# Patient Record
Sex: Male | Born: 1955 | Race: Black or African American | Hispanic: No | Smoking: Never smoker
Health system: Southern US, Community
[De-identification: ages and names within clinical notes are randomized; demographics above are authoritative.]

## PROBLEM LIST (undated history)

## (undated) DIAGNOSIS — I10 Essential (primary) hypertension: Secondary | ICD-10-CM

## (undated) DIAGNOSIS — F250 Schizoaffective disorder, bipolar type: Secondary | ICD-10-CM

## (undated) DIAGNOSIS — F251 Schizoaffective disorder, depressive type: Secondary | ICD-10-CM

## (undated) DIAGNOSIS — F259 Schizoaffective disorder, unspecified: Secondary | ICD-10-CM

## (undated) DIAGNOSIS — K219 Gastro-esophageal reflux disease without esophagitis: Secondary | ICD-10-CM

---

## 1994-04-06 HISTORY — PX: FRACTURE SURGERY: SHX138

## 2004-10-02 ENCOUNTER — Ambulatory Visit: Payer: Self-pay | Admitting: Nurse Practitioner

## 2004-10-03 ENCOUNTER — Ambulatory Visit: Payer: Self-pay | Admitting: *Deleted

## 2011-03-19 ENCOUNTER — Ambulatory Visit (HOSPITAL_COMMUNITY)
Admission: RE | Admit: 2011-03-19 | Discharge: 2011-03-19 | Disposition: A | Discharge status: Home / Self Care | Payer: Self-pay | Source: Ambulatory Visit | Attending: Family Medicine | Admitting: Family Medicine

## 2011-03-19 ENCOUNTER — Other Ambulatory Visit (HOSPITAL_COMMUNITY): Payer: Self-pay | Admitting: Family Medicine

## 2011-03-19 DIAGNOSIS — R079 Chest pain, unspecified: Secondary | ICD-10-CM | POA: Insufficient documentation

## 2011-04-09 ENCOUNTER — Emergency Department (HOSPITAL_COMMUNITY)
Admission: EM | Admit: 2011-04-09 | Discharge: 2011-04-09 | Disposition: A | Discharge status: Home / Self Care | Payer: Self-pay | Attending: Emergency Medicine | Admitting: Emergency Medicine

## 2011-04-09 DIAGNOSIS — Z7982 Long term (current) use of aspirin: Secondary | ICD-10-CM | POA: Insufficient documentation

## 2011-04-09 DIAGNOSIS — I10 Essential (primary) hypertension: Secondary | ICD-10-CM | POA: Insufficient documentation

## 2011-04-09 DIAGNOSIS — R490 Dysphonia: Secondary | ICD-10-CM | POA: Insufficient documentation

## 2011-04-09 DIAGNOSIS — Z79899 Other long term (current) drug therapy: Secondary | ICD-10-CM | POA: Insufficient documentation

## 2011-04-09 HISTORY — DX: Schizoaffective disorder, unspecified: F25.9

## 2011-04-09 HISTORY — DX: Schizoaffective disorder, bipolar type: F25.0

## 2011-04-09 HISTORY — DX: Essential (primary) hypertension: I10

## 2011-04-09 NOTE — ED Notes (Addendum)
Pt. Was in prison for 12 years and developed a hoarse throat. Is only able to speak a few hours and then is unable to talk at all.  Throat is scratchy, pt. Denies any cough or night sweats.  Does have once a month clack sputum with an odor come from his throat

## 2011-04-09 NOTE — ED Provider Notes (Signed)
History     CSN: [id number]  Arrival date & time 04/09/11  0920   First MD Initiated Contact with Patient 04/09/11 [phone]      Chief Complaint  Patient presents with  . Hoarse    hoarse throat    (Consider location/radiation/quality/duration/timing/severity/associated sxs/prior treatment) HPI The patient states she's been having gradually worsening hoarseness over the last 8 months. Patient had been incarcerated and was evaluated while he was in jail. Patient states he had his tonsils and thyroids checked. Patient was also told to drink more fluids. He states he is not a smoker. He does not use alcohol. Has not had any fevers or weight loss. Patient states this is painless. It however has been gradually getting worse. He finds that his voice is normal only for a few minutes and the beginning of the day. Patient came to emergency room to get some assistance as he recently got out of jail. Past Medical History  Diagnosis Date  . Hypertension   . Glaucoma   . Schizophrenia, schizo-affective     History reviewed. No pertinent past surgical history.  History reviewed. No pertinent family history.  History  Substance Use Topics  . Smoking status: Never Smoker   . Smokeless tobacco: Never Used  . Alcohol Use: No      Review of Systems  All other systems reviewed and are negative.    Allergies  Review of patient's allergies indicates no known allergies.  Home Medications   Current Outpatient Rx  Name Route Sig Dispense Refill  . ASPIRIN 81 MG PO TABS Oral Take 81 mg by mouth daily.      . ATENOLOL 25 MG PO TABS Oral Take 25 mg by mouth daily.      Marland Kitchen HYDROCHLOROTHIAZIDE 25 MG PO TABS Oral Take 25 mg by mouth daily.      Marland Kitchen LISINOPRIL 40 MG PO TABS Oral Take 40 mg by mouth daily.      Marland Kitchen OLANZAPINE 20 MG PO TABS Oral Take 20 mg by mouth at bedtime.      Marland Kitchen TIMOLOL HEMIHYDRATE 0.5 % OP SOLN Both Eyes Place 1 drop into both eyes 2 (two) times daily.        BP 133/97  Pulse 81   Temp(Src) 97.6 F (36.4 C) (Oral)  Resp 20  Ht 5\' 11"  (1.803 m)  Wt 211 lb (95.709 kg)  BMI 29.43 kg/m2  SpO2 95%  Physical Exam  Nursing note and vitals reviewed. Constitutional: He appears well-developed and well-nourished. No distress.  HENT:  Head: Normocephalic and atraumatic. No trismus in the jaw.  Right Ear: External ear normal.  Left Ear: External ear normal.  Mouth/Throat: Oropharynx is clear and moist. No oral lesions. Normal dentition. No uvula swelling. No oropharyngeal exudate.       Hoarse voice  Eyes: Conjunctivae are normal. Right eye exhibits no discharge. Left eye exhibits no discharge. No scleral icterus.  Neck: Normal range of motion. Neck supple. No tracheal deviation present. No mass and no thyromegaly present.  Cardiovascular: Normal rate, regular rhythm and intact distal pulses.   Pulmonary/Chest: Effort normal and breath sounds normal. No stridor. No respiratory distress. He has no wheezes. He has no rales.  Abdominal: Soft. Bowel sounds are normal. He exhibits no distension. There is no tenderness. There is no rebound and no guarding.  Musculoskeletal: He exhibits no edema and no tenderness.  Neurological: He is alert. He has normal strength. No sensory deficit. Cranial nerve deficit:  no  gross defecits noted. He exhibits normal muscle tone. He displays no seizure activity. Coordination normal.  Skin: Skin is warm and dry. No rash noted.  Psychiatric: He has a normal mood and affect.    ED Course  Procedures (including critical care time)  Labs Reviewed - No data to display No results found.   1. Hoarseness of voice       MDM  Patient with hoarseness for the last several months. He has not had weight loss and is not a smoker. Symptoms are suspicious for a possible laryngeal polyp. Certainly cannot exclude a malignancy. I have discussed the findings with the patient is in encouraged him to follow up with an ear nose and throat doctor. A referral  was given.        Celene Kras, MD 04/09/11 1016

## 2011-05-25 ENCOUNTER — Encounter (HOSPITAL_COMMUNITY): Payer: Self-pay | Admitting: Emergency Medicine

## 2011-05-25 ENCOUNTER — Emergency Department (HOSPITAL_COMMUNITY): Payer: Self-pay

## 2011-05-25 ENCOUNTER — Emergency Department (HOSPITAL_COMMUNITY)
Admission: EM | Admit: 2011-05-25 | Discharge: 2011-05-25 | Disposition: A | Discharge status: Home / Self Care | Payer: Self-pay | Attending: Emergency Medicine | Admitting: Emergency Medicine

## 2011-05-25 DIAGNOSIS — R221 Localized swelling, mass and lump, neck: Secondary | ICD-10-CM | POA: Insufficient documentation

## 2011-05-25 DIAGNOSIS — R220 Localized swelling, mass and lump, head: Secondary | ICD-10-CM | POA: Insufficient documentation

## 2011-05-25 DIAGNOSIS — H409 Unspecified glaucoma: Secondary | ICD-10-CM | POA: Insufficient documentation

## 2011-05-25 DIAGNOSIS — R0602 Shortness of breath: Secondary | ICD-10-CM | POA: Insufficient documentation

## 2011-05-25 DIAGNOSIS — I10 Essential (primary) hypertension: Secondary | ICD-10-CM | POA: Insufficient documentation

## 2011-05-25 MED ORDER — IOHEXOL 300 MG/ML  SOLN
75.0000 mL | Freq: Once | INTRAMUSCULAR | Status: AC | PRN
  Administered 2011-05-25: 75 mL via INTRAVENOUS

## 2011-05-25 NOTE — ED Notes (Signed)
Pt here for sore throat with hoarseness x several months; pt was seen here for similar in Jan; pt handling secretions

## 2011-05-25 NOTE — ED Provider Notes (Signed)
History     CSN: [id number]  Arrival date & time 05/25/11  1223   First MD Initiated Contact with Patient 05/25/11 1334      Chief Complaint  Patient presents with  . Sore Throat    (Consider location/radiation/quality/duration/timing/severity/associated sxs/prior treatment) HPI Comments: Patient states that he's had hoarseness for about last 6 months. He was seen here about a month ago for the same he was referred to ENT for followup but was not able to be seen ENT due to having no insurance. He now complains of about a two-week history of some feeling of fullness and swelling to his through. He feels like there is something stuck in his throat. He has shortness of breath when he lies flat due to the swelling and he feels his throat. He has no trouble handling secretions. Denies any fevers denies any nasal congestion denies a cough or chest congestion. Denies any pain on swallowing  Patient is a 56 y.o. male presenting with pharyngitis. The history is provided by the patient.  Sore Throat Associated symptoms include shortness of breath. Pertinent negatives include no chest pain, no abdominal pain and no headaches.    Past Medical History  Diagnosis Date  . Hypertension   . Glaucoma   . Schizophrenia, schizo-affective     History reviewed. No pertinent past surgical history.  History reviewed. No pertinent family history.  History  Substance Use Topics  . Smoking status: Never Smoker   . Smokeless tobacco: Never Used  . Alcohol Use: No      Review of Systems  Constitutional: Negative for fever, chills, diaphoresis and fatigue.  HENT: Negative for congestion, rhinorrhea and sneezing.   Eyes: Negative.   Respiratory: Positive for shortness of breath. Negative for cough and chest tightness.   Cardiovascular: Negative for chest pain and leg swelling.  Gastrointestinal: Negative for nausea, vomiting, abdominal pain, diarrhea and blood in stool.  Genitourinary: Negative for  frequency, hematuria, flank pain and difficulty urinating.  Musculoskeletal: Negative for back pain and arthralgias.  Skin: Negative for rash.  Neurological: Negative for dizziness, speech difficulty, weakness, numbness and headaches.    Allergies  Review of patient's allergies indicates no known allergies.  Home Medications   Current Outpatient Rx  Name Route Sig Dispense Refill  . ASPIRIN 81 MG PO TABS Oral Take 81 mg by mouth daily.     . ATENOLOL 25 MG PO TABS Oral Take 25 mg by mouth daily.     Marland Kitchen HYDROCHLOROTHIAZIDE 25 MG PO TABS Oral Take 25 mg by mouth daily.     Marland Kitchen LISINOPRIL 40 MG PO TABS Oral Take 40 mg by mouth daily.     Marland Kitchen OLANZAPINE 20 MG PO TABS Oral Take 20 mg by mouth at bedtime.     Marland Kitchen TIMOLOL HEMIHYDRATE 0.5 % OP SOLN Both Eyes Place 1 drop into both eyes 2 (two) times daily.        BP 140/98  Pulse 56  Temp(Src) 97.8 F (36.6 C) (Oral)  Resp 16  SpO2 98%  Physical Exam  Constitutional: He is oriented to person, place, and time. He appears well-developed and well-nourished.  HENT:  Head: Normocephalic and atraumatic.  Eyes: Pupils are equal, round, and reactive to light.  Neck: Normal range of motion. Neck supple. No tracheal deviation present. No thyromegaly present.  Cardiovascular: Normal rate, regular rhythm and normal heart sounds.   Pulmonary/Chest: Effort normal and breath sounds normal. No stridor. No respiratory distress. He has no wheezes.  He has no rales. He exhibits no tenderness.  Abdominal: Soft. Bowel sounds are normal. There is no tenderness. There is no rebound and no guarding.  Musculoskeletal: Normal range of motion. He exhibits no edema.  Lymphadenopathy:    He has no cervical adenopathy.  Neurological: He is alert and oriented to person, place, and time.  Skin: Skin is warm and dry. No rash noted.  Psychiatric: He has a normal mood and affect.    ED Course  Procedures (including critical care time)  No results found for this or any  previous visit. Ct Soft Tissue Neck W Contrast  05/25/2011  *RADIOLOGY REPORT*  Clinical Data: 56 year old male with hoarseness times 8 months. Abnormal sensation in the back of the throat and difficulty swallowing.  CT NECK WITH CONTRAST  Technique:  Multidetector CT imaging of the neck was performed with intravenous contrast.  Contrast: 75mL OMNIPAQUE IOHEXOL 300 MG/ML IV SOLN  Comparison: Chest radiographs 05/19/2010.  Findings: Contrast was injected via the left upper extremity. There is suboptimal intravascular contrast bolus timing which appears related to a stenosis of the left innominate vein (series 3 image 102).  Subsequently, contrast is being refluxed into the left internal jugular, paravertebral and azygos venous system. The right innominate vein and visualized superior vena cava are within normal limits.  No superior mediastinal mass.  In the midline posterior to the hyoid bone and associated with the anterior aspect of the thyroid cartilage there is a round intermediate density mass measuring 15 x 15 x 18 mm (series 3 image 51, sagittal image 33).  Surrounding pre-epiglottic fat does not appear inflamed, and the epiglottis is unaffected.  The thyroid gland is present and otherwise within normal limits.  There is asymmetric blunting of the right piriform sinus.  The glottis is closed.  No laryngeal or mucosal space hyperenhancement. Tonsillar pillar hypertrophy is symmetric.  Nasopharynx is within normal limits.  Negative parapharyngeal, retropharyngeal, and sublingual spaces. Submandibular and parotid glands are within normal limits.  No cervical lymphadenopathy.  Negative visualized brain parenchyma.  Left maxillary sinus mucous retention cyst.  Other visualized paranasal sinuses and mastoids are clear.  Degenerative changes in the cervical spine. No acute osseous abnormality identified. Negative visualized lung parenchyma except for dependent atelectasis.  IMPRESSION: 1.  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  Has the patient ever had an indwelling catheter at this site? 2.  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  No CT evidence that this system is infected at this time. 3.  Asymmetric appearance of the right piriform sinus.  No discrete pharyngeal mucosal mass is identified.  No cervical lymphadenopathy.  In light of the patient's symptoms, recommend ENT follow-up for direct visualization of this site.  Original Report Authenticated By: Harley Hallmark, M.D.     1. Throat swelling       MDM  Spoke with Dr. Annalee Genta who will f/u pt in office.  Pt in no distress now, no signs of airway compromise        Rolan Bucco, MD 05/25/11 1737

## 2011-05-26 ENCOUNTER — Encounter (HOSPITAL_COMMUNITY): Payer: Self-pay | Admitting: Emergency Medicine

## 2011-05-26 ENCOUNTER — Emergency Department (INDEPENDENT_AMBULATORY_CARE_PROVIDER_SITE_OTHER)
Admission: EM | Admit: 2011-05-26 | Discharge: 2011-05-26 | Disposition: A | Discharge status: Home / Self Care | Payer: Self-pay | Source: Home / Self Care | Attending: Emergency Medicine | Admitting: Emergency Medicine

## 2011-05-26 DIAGNOSIS — R490 Dysphonia: Secondary | ICD-10-CM

## 2011-05-26 MED ORDER — TRAMADOL HCL 50 MG PO TABS: 100.0000 mg | ORAL_TABLET | Freq: Three times a day (TID) | ORAL | Status: AC | PRN

## 2011-05-26 MED ORDER — OMEPRAZOLE 20 MG PO CPDR: 20.0000 mg | DELAYED_RELEASE_CAPSULE | Freq: Every day | ORAL | Status: DC

## 2011-05-26 NOTE — ED Provider Notes (Signed)
Chief Complaint  Patient presents with  . Sore Throat    History of Present Illness:   The patient is a 56 year old male who just got out of incarceration. For the past 8 months he has had hoarseness, sore throat, and trouble breathing. He has not had any cough. He denies any nasal congestion or postnasal drainage. He denies any cervical adenopathy or chest pain. He has occasional reflux symptoms, about once or twice a week. He denies any use of tobacco at all or any excessive use of alcohol. He went to the emergency room first for this in January and of this year and was referred to an ear nose and throat specialist. He doesn't have any insurance, and he couldn't keep the appointment and went back to the emergency room yesterday. At that time a CT scan was done which did not show any mediastinal mass or superior vena caval abnormality. He did have a midline round hyperdense mass associated with the hyoid bone and anterior thyroid cartilage and was likely due to a congenital thyroglossal duct cyst. There was no evidence of infection. He has an asymmetric appearance to the right piriformis sinus, but no discrete submucosal mucosal mass was identified and there was no cervical lymphadenopathy. He was told to followup with ear nose and throat specialist again and not given any medication. He comes in here today because he needs something for the pain.  Review of Systems:  Other than noted above, the patient denies any of the following symptoms. Systemic:  No fever, chills, sweats, fatigue, myalgias, headache, or anorexia. Eye:  No redness, pain or drainage. ENT:  No earache, nasal congestion, rhinorrhea, sinus pressure, or sore throat. Lungs:  No cough, sputum production, wheezing, shortness of breath. Or chest pain. GI:  No nausea, vomiting, abdominal pain or diarrhea. Skin:  No rash or itching.  PMFSH:  Past medical history, family history, social history, meds, and allergies were reviewed.  Physical  Exam:   Vital signs:  BP 134/92  Pulse 52  Temp(Src) 98.1 F (36.7 C) (Oral)  Resp 18  SpO2 97% General:  Alert, in no distress. Eye:  No conjunctival injection or drainage. ENT:  TMs and canals were normal, without erythema or inflammation.  Nasal mucosa was clear and uncongested, without drainage.  Mucous membranes were moist.  Pharynx was clear, without exudate or drainage.  There were no oral ulcerations or lesions. Neck:  Supple, no adenopathy, tenderness or mass. Lungs:  No respiratory distress.  Lungs were clear to auscultation, without wheezes, rales or rhonchi.  Breath sounds were clear and equal bilaterally. Heart:  Regular rhythm, without gallops, murmers or rubs. Skin:  Clear, warm, and dry, without rash or lesions.  Labs:  No results found for this or any previous visit.   Radiology:  Ct Soft Tissue Neck W Contrast  05/25/2011  *RADIOLOGY REPORT*  Clinical Data: 56 year old male with hoarseness times 8 months. Abnormal sensation in the back of the throat and difficulty swallowing.  CT NECK WITH CONTRAST  Technique:  Multidetector CT imaging of the neck was performed with intravenous contrast.  Contrast: 75mL OMNIPAQUE IOHEXOL 300 MG/ML IV SOLN  Comparison: Chest radiographs 05/19/2010.  Findings: Contrast was injected via the left upper extremity. There is suboptimal intravascular contrast bolus timing which appears related to a stenosis of the left innominate vein (series 3 image 102).  Subsequently, contrast is being refluxed into the left internal jugular, paravertebral and azygos venous system. The right innominate vein and visualized superior  vena cava are within normal limits.  No superior mediastinal mass.  In the midline posterior to the hyoid bone and associated with the anterior aspect of the thyroid cartilage there is a round intermediate density mass measuring 15 x 15 x 18 mm (series 3 image 51, sagittal image 33).  Surrounding pre-epiglottic fat does not appear inflamed,  and the epiglottis is unaffected.  The thyroid gland is present and otherwise within normal limits.  There is asymmetric blunting of the right piriform sinus.  The glottis is closed.  No laryngeal or mucosal space hyperenhancement. Tonsillar pillar hypertrophy is symmetric.  Nasopharynx is within normal limits.  Negative parapharyngeal, retropharyngeal, and sublingual spaces. Submandibular and parotid glands are within normal limits.  No cervical lymphadenopathy.  Negative visualized brain parenchyma.  Left maxillary sinus mucous retention cyst.  Other visualized paranasal sinuses and mastoids are clear.  Degenerative changes in the cervical spine. No acute osseous abnormality identified. Negative visualized lung parenchyma except for dependent atelectasis.  IMPRESSION: 1.  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  Has the patient ever had an indwelling catheter at this site? 2.  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  No CT evidence that this system is infected at this time. 3.  Asymmetric appearance of the right piriform sinus.  No discrete pharyngeal mucosal mass is identified.  No cervical lymphadenopathy.  In light of the patient's symptoms, recommend ENT follow-up for direct visualization of this site.  Original Report Authenticated By: Harley Hallmark, M.D.    Assessment:   Diagnoses that have been ruled out:  None  Diagnoses that are still under consideration:  None  Final diagnoses:  Hoarseness - I told him that this could be due to a polyp, tumor, cancer, or to chronic reflux. I told him it was the utmost importance for him to followup with the ear nose and throat specialist, Dr. Annalee Genta as soon as possible he could.      Medical Decision  Making:  I will go ahead and treat him for reflux since he does have some reflux symptoms, but I told him that even if he gets better with this he should still followup with Dr. Annalee Genta.   Plan:   1.  The following meds were prescribed:   New Prescriptions   OMEPRAZOLE (PRILOSEC) 20 MG CAPSULE    Take 1 capsule (20 mg total) by mouth daily.   TRAMADOL (ULTRAM) 50 MG TABLET    Take 2 tablets (100 mg total) by mouth every 8 (eight) hours as needed for pain.   2.  The patient was instructed in symptomatic care and handouts were given. 3.  The patient was told to return if becoming worse in any way, if no better in 3 or 4 days, and given some red flag symptoms that would indicate earlier return.   Roque Lias, MD 05/26/11 [phone]

## 2011-05-26 NOTE — ED Notes (Signed)
Patient reports initially being hoarse, now has pain associated with hoarseness.  Seen in ed x 2 for complaints.  Yesterday they did a ct scan and reported soft tissue swelling, but did not receive anything for pain.  Patient is trying to get enough money to see specialist per patient

## 2011-05-26 NOTE — Discharge Instructions (Signed)
Hoarseness Hoarseness is produced from a variety of causes. It is important to find the cause so it can be treated. In the absence of a cold or upper respiratory illness, any hoarseness lasting more than 2 weeks should be looked at by a specialist. This is especially important if you have a history of smoking or alcohol use. It is also important to keep in mind that as you grow older, your voice will naturally get weaker, making it easier for you to become hoarse from straining your vocal cords.  CAUSES  Any illness that affects your vocal cords can result in a hoarse voice. Examples of conditions that can affect the vocal cords are listed as follows:   Allergies.   Colds.   Sinusitis.   Gastroesophageal reflux disease.   Croup.   Injury.   Nodules.   Exposure to smoke or toxic fumes or gases.   Congenital and genetic defects.   Paralysis of the vocal cords.   Infections.   Advanced age.  DIAGNOSIS  In order to diagnose the cause of your hoarseness, your caregiver will examine your throat using an instrument that uses a tube with a small lighted camera (laryngoscope). It allows your caregiver to look into the mouth and down the throat. TREATMENT  For most cases, treatment will focus on the specific cause of the hoarseness. Depending on the cause, hoarseness can be a temporary condition (acute) or it can be long lasting (chronic). Most cases of hoarseness clear up without complications. Your caregiver will explain to you if this is not likely to happen. SEEK IMMEDIATE MEDICAL CARE IF:   You have increasing hoarseness or loss of voice.   You have shortness of breath.   You are coughing up blood.   There is pain in your neck or throat.  Document Released: 03/06/2005 Document Revised: 12/03/2010 Document Reviewed: 05/29/2010 [facility] Patient Information 2012 [place], [place].Laryngoscopy A laryngoscopy is a procedure performed to view the back of the throat, vocal cords, and  voice box (larynx). It may be done in order to figure out why a person has:  A cough.   Voice changes, such as new hoarseness.   Throat pain.   Problems swallowing.  During a laryngoscopy, tissue samples (biopsies) can be taken or foreign bodies can be removed. A laryngoscopy can be done in the caregiver's office. It may be performed with a hand-held mirror, or it may require the use of a fiberoptic scope. Under some circumstances, it may be done in a hospital with medicine to help you sleep (general anesthesia). LET YOUR CAREGIVER KNOW ABOUT:   Allergies.   Medicines taken, including herbs, eyedrops, over-the-counter medicines, and creams.   Use of steroids (by mouth or creams).   Previous problems with anesthetics or numbing medicines.   History of bleeding or blood problems.   History of blood clots.   Possibility of pregnancy, if this applies.   Previous surgery.   Other health problems.  RISKS AND COMPLICATIONS   Pain.   Gagging.   Vomiting.   Swelling.   Bleeding.   Problems from anesthesia.  BEFORE THE PROCEDURE  Several days before the procedure, you may have blood tests to make sure the blood clots normally. You may be asked to stop taking blood thinners, aspirin, and/or nonsteroidal anti-inflammatory drugs (NSAIDs) before the procedure. Do not give aspirin to children. If general anesthesia is going to be used, you will usually be asked to stop eating and drinking at least 8 hours before  the procedure. Have someone go with you to the procedure in order to drive you home afterwards. PROCEDURE  If you are having the procedure in the caregiver's office, it is usually performed while sitting up in a special exam chair with a headrest. A numbing medicine will be sprayed into the mouth and on the back of the throat. Your caregiver will use a gauze pad to hold the tongue out of the way. A mirror will be held at the back of the throat to allow your caregiver to see  down the throat. You may be asked to make certain sounds so that vocal cord movement can be observed. If a fiberoptic scope is being used, it will be inserted into either the nose or the mouth and slipped into the throat. Your caregiver can look through an eyepiece or can see an image projected on a monitor. Pieces of tissue can be taken (biopsies) or foreign bodies removed. If you need to have the procedure performed under general anesthesia, you will be lying down on a special operating table. The same kinds of procedures will be followed, but you will not be aware of them. AFTER THE PROCEDURE   When laryngoscopy is done with only local numbing, it usually does not require any changes to your activity level after the procedure is complete.   You may have a sore throat.   You may be asked to rest the voice for some length of time after the procedure.   Do not smoke.   Follow your caregiver's directions regarding eating and drinking after the procedure.   If you had a biopsy taken, your caregiver may advise trying to avoid coughing, whispering, or clearing the throat.   If you were given a general anesthetic or a medicine to help you relax (sedative), you will be sleepy.   There may be some pain or you may feel sick to your stomach (nauseous), but this can usually be controlled with medicines taken by mouth.   You will stay in the recovery room until awake and able to drink fluids.   You can go back to his or her usual level of activity within several days.  HOME CARE INSTRUCTIONS   Take all medicines exactly as directed.   Follow any prescribed diet.   Follow instructions regarding rest (including voice rest) and physical activity.   Follow instructions for the use of throat lozenges or gargles.  SEEK IMMEDIATE MEDICAL CARE IF:   You have severe pain.   You have new bleeding during coughing, spitting, or vomiting.   You develop nausea and vomiting.   You develop new problems  with swallowing.   You have difficulty breathing or have shortness of breath.   You have chest pain.   Your voice changes.   You have a fever.   You develop a cough.  MAKE SURE YOU:   Understand these instructions.   Will watch your condition.   Will get help right away if you are not doing well or get worse.  Document Released: 06/17/2009 Document Revised: 12/03/2010 Document Reviewed: 06/17/2009 [facility] Patient Information 2012 [place], [place].

## 2011-06-01 ENCOUNTER — Other Ambulatory Visit (HOSPITAL_COMMUNITY): Payer: Self-pay | Admitting: Otolaryngology

## 2011-06-01 DIAGNOSIS — D380 Neoplasm of uncertain behavior of larynx: Secondary | ICD-10-CM

## 2011-06-09 ENCOUNTER — Encounter (HOSPITAL_COMMUNITY)
Admission: RE | Admit: 2011-06-09 | Discharge: 2011-06-09 | Disposition: A | Discharge status: Home / Self Care | Payer: Self-pay | Source: Ambulatory Visit | Attending: Otolaryngology | Admitting: Otolaryngology

## 2011-06-09 ENCOUNTER — Encounter (HOSPITAL_COMMUNITY): Payer: Self-pay

## 2011-06-09 DIAGNOSIS — D491 Neoplasm of unspecified behavior of respiratory system: Secondary | ICD-10-CM | POA: Insufficient documentation

## 2011-06-09 DIAGNOSIS — D380 Neoplasm of uncertain behavior of larynx: Secondary | ICD-10-CM

## 2011-06-09 DIAGNOSIS — I719 Aortic aneurysm of unspecified site, without rupture: Secondary | ICD-10-CM | POA: Insufficient documentation

## 2011-06-09 MED ORDER — FLUDEOXYGLUCOSE F - 18 (FDG) INJECTION
18.9000 | Freq: Once | INTRAVENOUS | Status: AC | PRN
  Administered 2011-06-09: 18.9 via INTRAVENOUS

## 2011-06-19 ENCOUNTER — Encounter (HOSPITAL_COMMUNITY): Payer: Self-pay | Admitting: Pharmacy Technician

## 2011-06-23 ENCOUNTER — Other Ambulatory Visit: Payer: Self-pay | Admitting: Otolaryngology

## 2011-06-24 ENCOUNTER — Other Ambulatory Visit: Payer: Self-pay

## 2011-06-24 ENCOUNTER — Encounter (HOSPITAL_COMMUNITY): Payer: Self-pay

## 2011-06-24 ENCOUNTER — Encounter (HOSPITAL_COMMUNITY)
Admission: RE | Admit: 2011-06-24 | Discharge: 2011-06-24 | Disposition: A | Discharge status: Home / Self Care | Payer: Self-pay | Source: Ambulatory Visit | Attending: Otolaryngology | Admitting: Otolaryngology

## 2011-06-24 HISTORY — DX: Gastro-esophageal reflux disease without esophagitis: K21.9

## 2011-06-24 LAB — CBC
HCT: 39.4 % (ref 39.0–52.0)
Hemoglobin: 13.3 g/dL (ref 13.0–17.0)
MCHC: 33.8 g/dL (ref 30.0–36.0)
RBC: 4.71 MIL/uL (ref 4.22–5.81)

## 2011-06-24 LAB — SURGICAL PCR SCREEN: MRSA, PCR: NEGATIVE

## 2011-06-24 LAB — BASIC METABOLIC PANEL
BUN: 12 mg/dL (ref 6–23)
Chloride: 101 mEq/L (ref 96–112)
GFR calc Af Amer: 87 mL/min — ABNORMAL LOW (ref >90)
GFR calc non Af Amer: 75 mL/min — ABNORMAL LOW (ref >90)
Potassium: 3.7 mEq/L (ref 3.5–5.1)
Sodium: 138 mEq/L (ref 135–145)

## 2011-06-24 NOTE — Pre-Procedure Instructions (Signed)
20 Jimmy Marshall  06/24/2011   Your procedure is scheduled on:  Tuesday June 30, 2011  Report to [facility] at 0800 AM.  Call this number if you have problems the morning of surgery: [phone]   Remember:   Do not eat food:After Midnight.  May have clear liquids: up to 4 Hours before arrival. (up to 4:00am)  Clear liquids include soda, tea, black coffee, apple or grape juice, broth.  Take these medicines the morning of surgery with A SIP OF WATER: atenolol, omeprazole, timolol   Do not wear jewelry, make-up or nail polish.  Do not wear lotions, powders, or perfumes. You may wear deodorant.  Do not shave 48 hours prior to surgery.  Do not bring valuables to the hospital.  Contacts, dentures or bridgework may not be worn into surgery.  Leave suitcase in the car. After surgery it may be brought to your room.  For patients admitted to the hospital, checkout time is 11:00 AM the day of discharge.   Patients discharged the day of surgery will not be allowed to drive home.  Name and phone number of your driver: Audrie Lia [phone]  Special Instructions: CHG Shower Use Special Wash: 1/2 bottle night before surgery and 1/2 bottle morning of surgery.   Please read over the following fact sheets that you were given: Pain Booklet, Coughing and Deep Breathing, MRSA Information and Surgical Site Infection Prevention

## 2011-06-25 NOTE — Consult Note (Signed)
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  History includes HTN, glaucoma, Schizophrenia, schizo-affective, GERD, non-smoker.  EKG shows SB at 48 bpm.   CXR on 03/19/11 was WNL.  Preoperative labs acceptable.  Plan to proceed.

## 2011-06-29 MED ORDER — DEXAMETHASONE SODIUM PHOSPHATE 10 MG/ML IJ SOLN
10.0000 mg | Freq: Once | INTRAMUSCULAR | Status: AC
  Administered 2011-06-30: 10 mg
  Filled 2011-06-29: qty 1

## 2011-06-29 MED ORDER — CEFAZOLIN SODIUM-DEXTROSE 2-3 GM-% IV SOLR
2.0000 g | INTRAVENOUS | Status: AC
  Administered 2011-06-30: 2 g via INTRAVENOUS
  Filled 2011-06-29: qty 50

## 2011-06-30 ENCOUNTER — Encounter (HOSPITAL_COMMUNITY): Payer: Self-pay | Admitting: Vascular Surgery

## 2011-06-30 ENCOUNTER — Encounter (HOSPITAL_COMMUNITY)
Admission: RE | Disposition: A | Discharge status: Home / Self Care | Payer: Self-pay | Source: Ambulatory Visit | Attending: Otolaryngology

## 2011-06-30 ENCOUNTER — Ambulatory Visit (HOSPITAL_COMMUNITY): Payer: Self-pay | Admitting: Vascular Surgery

## 2011-06-30 ENCOUNTER — Ambulatory Visit (HOSPITAL_COMMUNITY)
Admission: RE | Admit: 2011-06-30 | Discharge: 2011-07-01 | Disposition: A | Discharge status: Home / Self Care | Payer: Self-pay | Source: Ambulatory Visit | Attending: Otolaryngology | Admitting: Otolaryngology

## 2011-06-30 ENCOUNTER — Encounter (HOSPITAL_COMMUNITY): Payer: Self-pay | Admitting: Otolaryngology

## 2011-06-30 DIAGNOSIS — Q892 Congenital malformations of other endocrine glands: Secondary | ICD-10-CM | POA: Insufficient documentation

## 2011-06-30 DIAGNOSIS — F259 Schizoaffective disorder, unspecified: Secondary | ICD-10-CM | POA: Insufficient documentation

## 2011-06-30 DIAGNOSIS — Z01812 Encounter for preprocedural laboratory examination: Secondary | ICD-10-CM | POA: Insufficient documentation

## 2011-06-30 DIAGNOSIS — R490 Dysphonia: Secondary | ICD-10-CM | POA: Insufficient documentation

## 2011-06-30 DIAGNOSIS — K219 Gastro-esophageal reflux disease without esophagitis: Secondary | ICD-10-CM | POA: Insufficient documentation

## 2011-06-30 DIAGNOSIS — I10 Essential (primary) hypertension: Secondary | ICD-10-CM | POA: Insufficient documentation

## 2011-06-30 DIAGNOSIS — Z01810 Encounter for preprocedural cardiovascular examination: Secondary | ICD-10-CM | POA: Insufficient documentation

## 2011-06-30 HISTORY — DX: Schizoaffective disorder, depressive type: F25.1

## 2011-06-30 HISTORY — PX: INCISION AND DRAINAGE / EXCISION THYROGLOSSAL CYST: SUR667

## 2011-06-30 HISTORY — PX: THYROGLOSSAL DUCT CYST: SHX297

## 2011-06-30 SURGERY — EXCISION, THYROGLOSSAL DUCT CYST
Anesthesia: General | Site: Neck | Wound class: Clean

## 2011-06-30 MED ORDER — LACTATED RINGERS IV SOLN
INTRAVENOUS | Status: DC
  Administered 2011-06-30: 09:00:00 via INTRAVENOUS

## 2011-06-30 MED ORDER — OLANZAPINE 10 MG PO TABS
20.0000 mg | ORAL_TABLET | Freq: Every day | ORAL | Status: DC
  Administered 2011-06-30: 20 mg via ORAL
  Filled 2011-06-30 (×2): qty 2

## 2011-06-30 MED ORDER — GLYCOPYRROLATE 0.2 MG/ML IJ SOLN
INTRAMUSCULAR | Status: DC | PRN
  Administered 2011-06-30: .6 mg via INTRAVENOUS

## 2011-06-30 MED ORDER — MORPHINE SULFATE 2 MG/ML IJ SOLN
INTRAMUSCULAR | Status: AC
  Administered 2011-06-30: 2 mg via INTRAMUSCULAR
  Filled 2011-06-30: qty 1

## 2011-06-30 MED ORDER — LISINOPRIL 40 MG PO TABS
40.0000 mg | ORAL_TABLET | Freq: Every day | ORAL | Status: DC
  Administered 2011-06-30 – 2011-07-01 (×2): 40 mg via ORAL
  Filled 2011-06-30 (×2): qty 1

## 2011-06-30 MED ORDER — HYDROMORPHONE HCL PF 1 MG/ML IJ SOLN
0.2500 mg | INTRAMUSCULAR | Status: DC | PRN
  Administered 2011-06-30 (×2): 0.5 mg via INTRAVENOUS

## 2011-06-30 MED ORDER — HYDROCHLOROTHIAZIDE 25 MG PO TABS
25.0000 mg | ORAL_TABLET | Freq: Every day | ORAL | Status: DC
  Administered 2011-06-30 – 2011-07-01 (×2): 25 mg via ORAL
  Filled 2011-06-30 (×2): qty 1

## 2011-06-30 MED ORDER — ZOLPIDEM TARTRATE 5 MG PO TABS: 5.0000 mg | ORAL_TABLET | Freq: Every evening | ORAL | Status: DC | PRN

## 2011-06-30 MED ORDER — CEFAZOLIN SODIUM 1-5 GM-% IV SOLN
1.0000 g | Freq: Three times a day (TID) | INTRAVENOUS | Status: DC
  Administered 2011-06-30 – 2011-07-01 (×3): 1 g via INTRAVENOUS
  Filled 2011-06-30 (×5): qty 50

## 2011-06-30 MED ORDER — ONDANSETRON HCL 4 MG/2ML IJ SOLN
INTRAMUSCULAR | Status: DC | PRN
  Administered 2011-06-30: 4 mg via INTRAVENOUS

## 2011-06-30 MED ORDER — HYDROCODONE-ACETAMINOPHEN 7.5-500 MG/15ML PO SOLN: 10.0000 mL | ORAL | Status: DC | PRN

## 2011-06-30 MED ORDER — LIDOCAINE HCL 4 % MT SOLN
OROMUCOSAL | Status: DC | PRN
  Administered 2011-06-30: 4 mL via TOPICAL

## 2011-06-30 MED ORDER — TIMOLOL HEMIHYDRATE 0.5 % OP SOLN: 1.0000 [drp] | Freq: Two times a day (BID) | OPHTHALMIC | Status: DC

## 2011-06-30 MED ORDER — ACETAMINOPHEN 160 MG/5ML PO SOLN: 650.0000 mg | ORAL | Status: DC | PRN

## 2011-06-30 MED ORDER — MIDAZOLAM HCL 5 MG/5ML IJ SOLN
INTRAMUSCULAR | Status: DC | PRN
  Administered 2011-06-30: 2 mg via INTRAVENOUS

## 2011-06-30 MED ORDER — PROPOFOL 10 MG/ML IV EMUL
INTRAVENOUS | Status: DC | PRN
  Administered 2011-06-30: 150 mg via INTRAVENOUS
  Administered 2011-06-30: 50 mg via INTRAVENOUS

## 2011-06-30 MED ORDER — PANTOPRAZOLE SODIUM 40 MG PO TBEC
40.0000 mg | DELAYED_RELEASE_TABLET | Freq: Every day | ORAL | Status: DC
  Administered 2011-06-30 – 2011-07-01 (×2): 40 mg via ORAL
  Filled 2011-06-30 (×2): qty 1

## 2011-06-30 MED ORDER — TIMOLOL MALEATE 0.5 % OP SOLN
1.0000 [drp] | Freq: Two times a day (BID) | OPHTHALMIC | Status: DC
  Administered 2011-07-01: 1 [drp] via OPHTHALMIC
  Filled 2011-06-30 (×2): qty 5

## 2011-06-30 MED ORDER — MORPHINE SULFATE 4 MG/ML IJ SOLN: 2.0000 mg | INTRAMUSCULAR | Status: DC | PRN

## 2011-06-30 MED ORDER — ACETAMINOPHEN 650 MG RE SUPP: 650.0000 mg | RECTAL | Status: DC | PRN

## 2011-06-30 MED ORDER — LACTATED RINGERS IV SOLN
INTRAVENOUS | Status: DC | PRN
  Administered 2011-06-30 (×2): via INTRAVENOUS

## 2011-06-30 MED ORDER — NEOSTIGMINE METHYLSULFATE 1 MG/ML IJ SOLN
INTRAMUSCULAR | Status: DC | PRN
  Administered 2011-06-30: 4 mg via INTRAVENOUS

## 2011-06-30 MED ORDER — ATENOLOL 25 MG PO TABS
25.0000 mg | ORAL_TABLET | Freq: Every day | ORAL | Status: DC
  Administered 2011-07-01: 25 mg via ORAL
  Filled 2011-06-30: qty 1

## 2011-06-30 MED ORDER — 0.9 % SODIUM CHLORIDE (POUR BTL) OPTIME
TOPICAL | Status: DC | PRN
  Administered 2011-06-30: 1000 mL

## 2011-06-30 MED ORDER — ATENOLOL 25 MG PO TABS
25.0000 mg | ORAL_TABLET | ORAL | Status: DC
  Filled 2011-06-30: qty 1

## 2011-06-30 MED ORDER — HYDROCODONE-ACETAMINOPHEN 5-325 MG PO TABS: 1.0000 | ORAL_TABLET | Freq: Four times a day (QID) | ORAL | Status: AC | PRN

## 2011-06-30 MED ORDER — FENTANYL CITRATE 0.05 MG/ML IJ SOLN
INTRAMUSCULAR | Status: DC | PRN
  Administered 2011-06-30: 100 ug via INTRAVENOUS

## 2011-06-30 MED ORDER — ROCURONIUM BROMIDE 100 MG/10ML IV SOLN
INTRAVENOUS | Status: DC | PRN
  Administered 2011-06-30: 50 mg via INTRAVENOUS

## 2011-06-30 MED ORDER — LIDOCAINE-EPINEPHRINE 1 %-1:100000 IJ SOLN
INTRAMUSCULAR | Status: DC | PRN
  Administered 2011-06-30: 3 mL

## 2011-06-30 MED ORDER — DROPERIDOL 2.5 MG/ML IJ SOLN: 0.6250 mg | INTRAMUSCULAR | Status: DC | PRN

## 2011-06-30 SURGICAL SUPPLY — 51 items
APPLIER CLIP 9.375 SM OPEN (CLIP)
CANISTER SUCTION 2500CC (MISCELLANEOUS) ×2 IMPLANT
CLEANER TIP ELECTROSURG 2X2 (MISCELLANEOUS) ×2 IMPLANT
CLIP APPLIE 9.375 SM OPEN (CLIP) IMPLANT
CLOTH BEACON ORANGE TIMEOUT ST (SAFETY) ×2 IMPLANT
CONT SPEC 4OZ CLIKSEAL STRL BL (MISCELLANEOUS) ×2 IMPLANT
CORDS BIPOLAR (ELECTRODE) ×2 IMPLANT
COVER SURGICAL LIGHT HANDLE (MISCELLANEOUS) ×2 IMPLANT
DECANTER SPIKE VIAL GLASS SM (MISCELLANEOUS) ×2 IMPLANT
DERMABOND ADVANCED (GAUZE/BANDAGES/DRESSINGS) ×1
DERMABOND ADVANCED .7 DNX12 (GAUZE/BANDAGES/DRESSINGS) ×1 IMPLANT
DRAIN JACKSON RD 7FR 3/32 (WOUND CARE) IMPLANT
DRAIN SNY 10 ROU (WOUND CARE) ×2 IMPLANT
ELECT COATED BLADE 2.86 ST (ELECTRODE) ×2 IMPLANT
ELECT REM PT RETURN 9FT ADLT (ELECTROSURGICAL) ×2
ELECTRODE REM PT RTRN 9FT ADLT (ELECTROSURGICAL) ×1 IMPLANT
EVACUATOR SILICONE 100CC (DRAIN) ×2 IMPLANT
GAUZE SPONGE 4X4 16PLY XRAY LF (GAUZE/BANDAGES/DRESSINGS) ×2 IMPLANT
GLOVE BIO SURGEON STRL SZ7.5 (GLOVE) ×2 IMPLANT
GLOVE BIOGEL M 7.0 STRL (GLOVE) ×2 IMPLANT
GLOVE BIOGEL PI IND STRL 6.5 (GLOVE) ×1 IMPLANT
GLOVE BIOGEL PI IND STRL 7.0 (GLOVE) ×1 IMPLANT
GLOVE BIOGEL PI IND STRL 7.5 (GLOVE) ×1 IMPLANT
GLOVE BIOGEL PI INDICATOR 6.5 (GLOVE) ×1
GLOVE BIOGEL PI INDICATOR 7.0 (GLOVE) ×1
GLOVE BIOGEL PI INDICATOR 7.5 (GLOVE) ×1
GLOVE ECLIPSE 7.5 STRL STRAW (GLOVE) ×4 IMPLANT
GLOVE SURG SS PI 6.5 STRL IVOR (GLOVE) ×4 IMPLANT
GLOVE SURG SS PI 7.0 STRL IVOR (GLOVE) ×2 IMPLANT
GOWN STRL NON-REIN LRG LVL3 (GOWN DISPOSABLE) ×8 IMPLANT
KIT BASIN OR (CUSTOM PROCEDURE TRAY) ×2 IMPLANT
KIT ROOM TURNOVER OR (KITS) ×2 IMPLANT
MARKER SKIN DUAL TIP RULER LAB (MISCELLANEOUS) IMPLANT
NS IRRIG 1000ML POUR BTL (IV SOLUTION) ×2 IMPLANT
PAD ARMBOARD 7.5X6 YLW CONV (MISCELLANEOUS) ×4 IMPLANT
PENCIL BUTTON HOLSTER BLD 10FT (ELECTRODE) ×2 IMPLANT
SPONGE INTESTINAL PEANUT (DISPOSABLE) IMPLANT
STAPLER VISISTAT 35W (STAPLE) ×2 IMPLANT
STRIP CLOSURE SKIN 1/2X4 (GAUZE/BANDAGES/DRESSINGS) IMPLANT
SUT CHROMIC 2 0 SH (SUTURE) ×2 IMPLANT
SUT ETHILON 3 0 FSL (SUTURE) ×2 IMPLANT
SUT ETHILON 5 0 PS 2 18 (SUTURE) IMPLANT
SUT SILK 2 0 REEL (SUTURE) IMPLANT
SUT SILK 3 0 REEL (SUTURE) ×2 IMPLANT
SUT SILK 4 0 REEL (SUTURE) IMPLANT
SUT VIC AB 3-0 FS2 27 (SUTURE) ×2 IMPLANT
SUT VICRYL 4-0 PS2 18IN ABS (SUTURE) ×2 IMPLANT
TOWEL OR 17X24 6PK STRL BLUE (TOWEL DISPOSABLE) ×2 IMPLANT
TOWEL OR 17X26 10 PK STRL BLUE (TOWEL DISPOSABLE) ×2 IMPLANT
TRAY ENT MC OR (CUSTOM PROCEDURE TRAY) ×2 IMPLANT
WATER STERILE IRR 1000ML POUR (IV SOLUTION) ×2 IMPLANT

## 2011-06-30 NOTE — Discharge Instructions (Signed)
Thyroglossal Cyst Removal Care After Refer to this sheet in the next few weeks. These instructions provide you with information on caring for yourself after your procedure. Your caregiver may also give you specific instructions. Your treatment has been planned according to current medical practices, but problems sometimes occur. Call your caregiver if you have any problems or questions after your procedure. HOME CARE INSTRUCTIONS It is very important that you follow your caregiver's instructions. Following these instructions will help to ensure that your recovery is quick and complete.  Take only pain medicine prescribed by your caregiver or surgeon, and take it exactly as it is prescribed. Do not take over-the-counter pain medicine unless it is approved by your caregiver or surgeon.   For the first 2 days that you are home after your procedure, you should get as much rest as possible. Beginning on the third day, you should be able to return to your normal activities. However, you should not do anything that takes great effort or energy for at least 2 weeks, or as instructed by your caregiver.   For the first day after your surgery, you should only have liquids. The second day after surgery, you can begin eating soft foods. Continue a soft food diet for 2 to 3 days.   Proper wound care includes the following steps:   Check your wound every day until it is healed. Look for any sign of redness or swelling. Also make sure no blood or liquid is oozing from your wound.   Keep your wound dry for at least 2 days.   Do not take a bath until the stitches have dissolved. This should take about 1 week.   Once the dressing has been removed, clean the wound and the area around the wound every day as directed by your surgeon. You may need to apply an antibiotic cream to the wound until it has healed.  SEEK MEDICAL CARE IF:  Your throat or the wound hurts, even after taking pain medicine.   You have  hoarseness that does not resolve in 7 to 10 days.   You have difficulty swallowing.   Your cyst recurs.  SEEK IMMEDIATE MEDICAL CARE IF:   Your pain gets much worse.   You have difficulty breathing.   Your wound becomes red or swollen, or it bleeds or oozes liquid.   You have a fever.   You have nausea or begin vomiting.  MAKE SURE YOU:  Understand these instructions.   Will watch your condition.   Will get help right away if you are not doing well or get worse.  Document Released: 07/18/2010 Document Revised: 03/12/2011 Document Reviewed: 07/18/2010 [facility] Patient Information 2012 [place], [place].

## 2011-06-30 NOTE — Transfer of Care (Signed)
Immediate Anesthesia Transfer of Care Note  Patient: Jimmy Marshall  Procedure(s) Performed: Procedure(s) (LRB): THYROGLOSSAL DUCT CYST (N/A)  Patient Location: PACU  Anesthesia Type: General  Level of Consciousness: awake, alert  and oriented  Airway & Oxygen Therapy: Patient Spontanous Breathing and Patient connected to nasal cannula oxygen  Post-op Assessment: Report given to PACU RN, Post -op Vital signs reviewed and stable and Patient moving all extremities X 4  Post vital signs: Reviewed and stable  Complications: No apparent anesthesia complications

## 2011-06-30 NOTE — H&P (Signed)
Jimmy Marshall is an 56 y.o. male.   Chief Complaint: Ant neck mass HPI: No sx, chronic hoarseness, CT shows ant. Neck cyst  Past Medical History  Diagnosis Date  . Hypertension   . Glaucoma   . GERD (gastroesophageal reflux disease)   . Schizophrenia, schizo-affective     Past Surgical History  Procedure Date  . Fracture surgery     jaw 1996    No family history on file. Social History:  reports that he has never smoked. He has never used smokeless tobacco. He reports that he does not drink alcohol or use illicit drugs.  Allergies: No Known Allergies  Medications Prior to Admission  Medication Dose Route Frequency Provider Last Rate Last Dose  . 0.9 % irrigation (POUR BTL)    PRN Osborn Coho, MD   1,000 mL at 06/30/11 0950  . atenolol (TENORMIN) tablet 25 mg  25 mg Oral To 282 Remonia Richter, MD      . ceFAZolin (ANCEF) IVPB 2 g/50 mL premix  2 g Intravenous 60 min Pre-Op Osborn Coho, MD      . dexamethasone (DECADRON) injection 10 mg  10 mg Other Once Osborn Coho, MD      . lactated ringers infusion   Intravenous Continuous Kerby Nora, MD 50 mL/hr at 06/30/11 0920    . lidocaine-EPINEPHrine (XYLOCAINE W/EPI) 1 %-1:100000 (with pres) injection    PRN Osborn Coho, MD   20 mL at 06/30/11 0950   Medications Prior to Admission  Medication Sig Dispense Refill  . aspirin EC 81 MG tablet Take 81 mg by mouth daily.      Marland Kitchen atenolol (TENORMIN) 25 MG tablet Take 25 mg by mouth daily.       . hydrochlorothiazide (HYDRODIURIL) 25 MG tablet Take 25 mg by mouth daily.       Marland Kitchen lisinopril (PRINIVIL,ZESTRIL) 40 MG tablet Take 40 mg by mouth daily.       Marland Kitchen OLANZapine (ZYPREXA) 20 MG tablet Take 20 mg by mouth at bedtime.       Marland Kitchen omeprazole (PRILOSEC) 20 MG capsule Take 20 mg by mouth daily.      . timolol (BETIMOL) 0.5 % ophthalmic solution Place 1 drop into both eyes 2 (two) times daily.          No results found for this or any previous visit (from the past 48  hour(s)). No results found.  Review of Systems  Constitutional: Negative.   HENT: Negative.   Respiratory: Negative.   Cardiovascular: Negative.   Musculoskeletal: Negative.   Neurological: Negative.     Blood pressure 124/77, pulse 51, temperature 97.7 F (36.5 C), temperature source Oral, resp. rate 18, SpO2 100.00%. Physical Exam  Constitutional: He is oriented to person, place, and time. He appears well-developed and well-nourished.  HENT:  Head: Normocephalic and atraumatic.  Right Ear: Hearing and ear canal normal.  Left Ear: Hearing and ear canal normal.  Nose: Nose normal.  Mouth/Throat: Uvula is midline, oropharynx is clear and moist and mucous membranes are normal.  Neck: Trachea normal and normal range of motion. Neck supple.       Small midline cystic mass, no LA  Cardiovascular: Normal rate and regular rhythm.   Respiratory: Effort normal and breath sounds normal.  GI: Soft.  Musculoskeletal: Normal range of motion.  Neurological: He is alert and oriented to person, place, and time.     Assessment/Plan Adm for OP surgery, OWER  Excision of ant neck cyst  Hernan Turnage 06/30/2011, 10:05 AM

## 2011-06-30 NOTE — Anesthesia Preprocedure Evaluation (Addendum)
Anesthesia Evaluation  Patient identified by MRN, date of birth, ID band Patient awake    Reviewed: Allergy & Precautions, H&P , NPO status , Patient's Chart, lab work & pertinent test results  Airway Mallampati: I TM Distance: >3 FB Neck ROM: Full    Dental  (+) Teeth Intact and Dental Advisory Given   Pulmonary Recent URI ,    Pulmonary exam normal       Cardiovascular hypertension, Pt. on medications and Pt. on home beta blockers     Neuro/Psych PSYCHIATRIC DISORDERS Schizophrenia    GI/Hepatic Neg liver ROS, GERD-  Controlled,  Endo/Other  negative endocrine ROS  Renal/GU negative Renal ROS     Musculoskeletal   Abdominal   Peds  Hematology negative hematology ROS (+)   Anesthesia Other Findings   Reproductive/Obstetrics                         Anesthesia Physical Anesthesia Plan  ASA: III  Anesthesia Plan: General   Post-op Pain Management:    Induction: Intravenous  Airway Management Planned: Oral ETT  Additional Equipment:   Intra-op Plan:   Post-operative Plan: Extubation in OR  Informed Consent: I have reviewed the patients History and Physical, chart, labs and discussed the procedure including the risks, benefits and alternatives for the proposed anesthesia with the patient or authorized representative who has indicated his/her understanding and acceptance.   Dental advisory given  Plan Discussed with: CRNA, Anesthesiologist and Surgeon  Anesthesia Plan Comments:         Anesthesia Quick Evaluation

## 2011-06-30 NOTE — Anesthesia Postprocedure Evaluation (Signed)
Anesthesia Post Note  Patient: Jimmy Marshall  Procedure(s) Performed: Procedure(s) (LRB): THYROGLOSSAL DUCT CYST (N/A)  Anesthesia type: general  Patient location: PACU  Post pain: Pain level controlled  Post assessment: Patient's Cardiovascular Status Stable  Last Vitals:  Filed Vitals:   06/30/11 1215  BP: 134/80  Pulse: 46  Temp:   Resp: 18    Post vital signs: Reviewed and stable  Level of consciousness: sedated  Complications: No apparent anesthesia complications

## 2011-06-30 NOTE — Progress Notes (Signed)
   ENT Progress Note:  s/p Procedure(s): THYROGLOSSAL DUCT CYST   Subjective: Post op check - mild pain, no swelling, tol po  Objective: Vital signs in last 24 hours: Temp:  [96.9 F (36.1 C)-98.3 F (36.8 C)] 98.3 F (36.8 C) (03/26 1747) Pulse Rate:  [44-56] 54  (03/26 1747) Resp:  [13-20] 18  (03/26 1747) BP: (113-147)/(68-102) 113/68 mmHg (03/26 1747) SpO2:  [94 %-100 %] 96 % (03/26 1747) Weight change:     Intake/Output from previous day:   Intake/Output this shift: Total I/O In: 1315 [I.V.:1300; Other:15] Out: -   Labs: No results found for this basename: WBC:2,HGB:2,HCT:2,PLT:2 in the last 72 hours No results found for this basename: NA:2,K:2,CL:2,CO2:2,GLUCOSE:2,BUN:2,CREATININR:2,CALCIUM:2 in the last 72 hours  Studies/Results: No results found.   PHYSICAL EXAM: Inc intact, no erythema or swelling Airway stable   Assessment/Plan: Stable post op O/N Obs    Amarii Amy 06/30/2011, 6:56 PM

## 2011-06-30 NOTE — Progress Notes (Signed)
Report given to Harpreet Signore rn as caregiver 

## 2011-06-30 NOTE — Brief Op Note (Signed)
06/30/2011  11:42 AM  PATIENT:  Jimmy Marshall  56 y.o. male  PRE-OPERATIVE DIAGNOSIS:  THYROGLOSSAL CYST  POST-OPERATIVE DIAGNOSIS:  thyroglossal duct cyst  PROCEDURE:  Procedure(s) (LRB): Excision TGD Cyst with Central Hyoid Resection  SURGEON:  Surgeon(s) and Role:    * Christia Reading, MD - Assisting    * Osborn Coho, MD - Primary  PHYSICIAN ASSISTANT:   ASSISTANTS: Jenne Pane   ANESTHESIA:   general  EBL:  Total I/O In: 1000 [I.V.:1000] Out: - minimal  BLOOD ADMINISTERED:none  DRAINS: (10 Fr) Jackson-Pratt drain(s) with closed bulb suction in the Anterior Neck   LOCAL MEDICATIONS USED:  LIDOCAINE 3 ml  SPECIMEN:  Source of Specimen:  Ant Neck Cyst  DISPOSITION OF SPECIMEN:  PATHOLOGY  COUNTS:  YES  TOURNIQUET:  * No tourniquets in log *  DICTATION: .Other Dictation: Dictation Number [id number]  PLAN OF CARE: Admit for overnight observation  PATIENT DISPOSITION:  PACU - hemodynamically stable.   Delay start of Pharmacological VTE agent (>24hrs) due to surgical blood loss or risk of bleeding: yes

## 2011-07-01 ENCOUNTER — Encounter (HOSPITAL_COMMUNITY): Payer: Self-pay | Admitting: Otolaryngology

## 2011-07-01 NOTE — Discharge Summary (Signed)
Physician Discharge Summary  Patient ID: Jimmy Marshall MRN: [id number] DOB/AGE: 1955-08-01 56 y.o.  Admit date: 06/30/2011 Discharge date: 07/01/2011  Admission Diagnoses:  Principal Problem:  *Thyroglossal duct cyst   Discharge Diagnoses:  Same  Surgeries: Procedure(s): THYROGLOSSAL DUCT CYST on 06/30/2011   Consultants: nonr  Discharged Condition: Improved  Hospital Course: Jimmy Marshall is an 56 y.o. male who was admitted 06/30/2011 with a diagnosis of Thyroglossal duct cyst and went to the operating room on 06/30/2011 and underwent the above named procedures.   Stable post op. D/c to home.  Recent vital signs:  Filed Vitals:   07/01/11 0537  BP: 110/66  Pulse: 54  Temp: 97.8 F (36.6 C)  Resp: 18    Recent laboratory studies:  Results for orders placed during the hospital encounter of 06/24/11  BASIC METABOLIC PANEL      Component Value Range   Sodium 138  135 - 145 (mEq/L)   Potassium 3.7  3.5 - 5.1 (mEq/L)   Chloride 101  96 - 112 (mEq/L)   CO2 27  19 - 32 (mEq/L)   Glucose, Bld 113 (*) 70 - 99 (mg/dL)   BUN 12  6 - 23 (mg/dL)   Creatinine, Ser 4.09  0.50 - 1.35 (mg/dL)   Calcium 9.9  8.4 - 81.1 (mg/dL)   GFR calc non Af Amer 75 (*) >90 (mL/min)   GFR calc Af Amer 87 (*) >90 (mL/min)  CBC      Component Value Range   WBC 5.0  4.0 - 10.5 (K/uL)   RBC 4.71  4.22 - 5.81 (MIL/uL)   Hemoglobin 13.3  13.0 - 17.0 (g/dL)   HCT 91.4  78.2 - 95.6 (%)   MCV 83.7  78.0 - 100.0 (fL)   MCH 28.2  26.0 - 34.0 (pg)   MCHC 33.8  30.0 - 36.0 (g/dL)   RDW 21.3  08.6 - 57.8 (%)   Platelets 221  150 - 400 (K/uL)  SURGICAL PCR SCREEN      Component Value Range   MRSA, PCR NEGATIVE  NEGATIVE    Staphylococcus aureus NEGATIVE  NEGATIVE     Discharge Medications:   Medication List  As of 07/01/2011  7:19 AM   STOP taking these medications         aspirin EC 81 MG tablet         TAKE these medications         atenolol 25 MG tablet   Commonly known as: TENORMIN    Take 25 mg by mouth daily.      hydrochlorothiazide 25 MG tablet   Commonly known as: HYDRODIURIL   Take 25 mg by mouth daily.      HYDROcodone-acetaminophen 5-325 MG per tablet   Commonly known as: NORCO   Take 1-2 tablets by mouth every 6 (six) hours as needed for pain.      lisinopril 40 MG tablet   Commonly known as: PRINIVIL,ZESTRIL   Take 40 mg by mouth daily.      OLANZapine 20 MG tablet   Commonly known as: ZYPREXA   Take 20 mg by mouth at bedtime.      omeprazole 20 MG capsule   Commonly known as: PRILOSEC   Take 20 mg by mouth daily.      timolol 0.5 % ophthalmic solution   Commonly known as: BETIMOL   Place 1 drop into both eyes 2 (two) times daily.  Diagnostic Studies: Nm Pet Image Initial (pi) Skull Base To Thigh  06/09/2011  *RADIOLOGY REPORT*  Clinical Data:  Initial treatment strategy for laryngeal neoplasm.  NUCLEAR MEDICINE PET CT INITIAL (PI) SKULL BASE TO THIGH  Technique:  18.9 mCi F-18 FDG was injected intravenously via the left antecubital fossa.  Full-ring PET imaging was performed from the skull base through the mid-thighs 63  minutes after injection. CT data was obtained and used for attenuation correction and anatomic localization only.  (This was not acquired as a diagnostic CT examination.)  Fasting Blood Glucose:  93  Patient Weight:  208 pounds.  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  No suspicious hypermetabolic foci in the neck.  Symmetric vocal cord uptake, likely physiologic.  No suspicious cervical lymphadenopathy.  Mucous retention cyst in the left maxillary sinus.  No suspicious hypermetabolic foci in the chest.  No suspicious pulmonary nodules.  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  No suspicious mediastinal, hilar, or axillary lymphadenopathy.  No suspicious hypermetabolic foci in the abdomen or pelvis. Numerous hepatic cysts.  Retroaortic left renal vein.  Normal appendix.  No suspicious abdominopelvic lymphadenopathy.  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  No suspicious hypermetabolic foci in the neck, chest, abdomen, or pelvis.  Original Report Authenticated By: Charline Bills, M.D.    Disposition: 01-Home or Self Care  Discharge Orders    Future Orders Please Complete By Expires   Diet - low sodium heart healthy      Increase activity slowly      Discharge instructions      Comments:   1. Limited activity 2. Liquid and soft diet 3. May bathe and shower 4. Elevate Head of Bed 5. No Ointment to Incision   Lifting restrictions      Comments:   Limited physical activity, No lifting or straining      Follow-up Information    Follow up with Aerilyn Slee, MD in 2 weeks.   Contact information:   [address], [address] [place] [address] [phone]           Signed: Osborn Coho 07/01/2011, 7:19 AM

## 2011-07-01 NOTE — Progress Notes (Signed)
Discharge instructions gone over with patient. Prescription given. Home medications gone over with patient. Incisional care, diet, and activity gone over with patient. Follow up appointment to be made. Patient verbalized understanding of instructions.

## 2011-07-01 NOTE — Progress Notes (Signed)
1 Day Post-Op  Subjective: Stable, tol po, no swelling  Objective: Vital signs in last 24 hours: Temp:  [96.9 F (36.1 C)-98.6 F (37 C)] 97.8 F (36.6 C) (03/27 0537) Pulse Rate:  [44-79] 54  (03/27 0537) Resp:  [13-20] 18  (03/27 0537) BP: (99-147)/(65-102) 110/66 mmHg (03/27 0537) SpO2:  [94 %-100 %] 97 % (03/27 0537) Weight:  [95.2 kg (209 lb 14.1 oz)] 95.2 kg (209 lb 14.1 oz) (03/27 0129) Last BM Date: 06/30/11  Intake/Output from previous day: 03/26 0701 - 03/27 0700 In: 1450 [I.V.:1300; IV Piggyback:150] Out: 2430 [Urine:2050; Drains:30; Stool:350] Intake/Output this shift:    Exam: Inc intact, no swelling JP <30cc, removed     Studies/Results: No results found.  Anti-infectives: Anti-infectives     Start     Dose/Rate Route Frequency Ordered Stop   06/30/11 1430   ceFAZolin (ANCEF) IVPB 1 g/50 mL premix        1 g 100 mL/hr over 30 Minutes Intravenous Every 8 hours 06/30/11 1354     06/30/11 0500   ceFAZolin (ANCEF) IVPB 2 g/50 mL premix        2 g 100 mL/hr over 30 Minutes Intravenous 60 min pre-op 06/29/11 1427 06/30/11 1025          Assessment/Plan: s/p Procedure(s) (LRB): THYROGLOSSAL DUCT CYST (N/A) Discharge, f/u in 10 d  LOS: 1 day    Jimmy Marshall 07/01/2011

## 2011-07-01 NOTE — Op Note (Signed)
NAMEMarland Marshall  ROHITH, FAUTH NO.:  [id number]  MEDICAL RECORD NO.:  [id number]  LOCATION:  5123                         FACILITY:  [facility]  PHYSICIAN:  Kinnie Scales. Annalee Genta, M.D.DATE OF BIRTH:  08-Apr-1955  DATE OF PROCEDURE:  06/30/2011 DATE OF DISCHARGE:                              OPERATIVE REPORT   LOCATION:  [facility] Main OR.  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  SURGICAL PROCEDURE:  Excision of thyroglossal duct cyst with central hyoid resection.  ANESTHESIA:  General endotracheal.  SURGEON:  Kinnie Scales. Annalee Genta, MD  ASSISTANT:  Karren Burly D. Jenne Pane, MD  BLOOD LOSS:  Minimal.  COMPLICATIONS:  None.  The patient was transferred from the operating room to recovery room in stable condition.  BRIEF HISTORY:  The patient is a 56 year old black male referred to our office from the emergency department for evaluation of an abnormal CT finding.  The patient had a chronic history of hoarseness, he is a nonsmoker, and no other active concerns regarding swallowing or breathing.  He presented to the emergency department for evaluation of hoarseness and a CT scan of the neck showed a 2-cm cystic mass posterior to the hyoid bone and adjacent to the superior aspect of the laryngeal cartilage.  Examination including flexible endoscopy showed no evidence of mucosal lesion, ulcer, or mass.  Given the presentation and concerns regarding possible malignancy, a PET scan was performed and there is no evidence of hypermetabolic activity in the region of concern.  Given the patient's history, examination, and diagnostic findings, I recommended that we undertake excision of the cystic mass under general anesthesia. The risks, benefits, and possible complications of the procedures  were discussed in detail with the patient and his family, and they understood and concurred with our plan for surgery, which is scheduled an outpatient under general anesthesia at [facility] Main OR with anticipated overnight observation.  PROCEDURE:  The patient was brought to the operating room on June 30, 2011, placed in supine position on the operating table.  General endotracheal anesthesia established without difficulty.  When the patient was adequately anesthetized, he was positioned on the operating table, prepped and draped in a sterile fashion.  His neck was then injected with a total of 3 mL of 1% lidocaine 1:100,000 solution epinephrine injected in subcutaneous fashion in the proposed skin incision in the anterior neck.  After allowing adequate time for vasoconstriction hemostasis, a 4-cm horizontally-oriented skin incision was created in a preexisting skin crease.  This was carried through the skin underlying subcutaneous tissue.  The platysmal muscles were identified laterally and transected.  The central compartment of the neck was then carefully dissected, strap muscles identified and divided in the midline and then lateralized allowing access to the anterior aspect of the patient's laryngeal cartilage and hyoid bone.  The strap muscles overlying the laryngeal cartilage were then elevated and dissected laterally again allowing better exposure of the anterior larynx and hyoid region.  A soft tissue mass consistent with CT findings was then noted in the superior laryngeal notch.  Careful dissection carried out in this area with both blunt and sharp dissection elevating the cyst superiorly.  There was no evidence of mucosal tear or pharyngotomy.  Dissection was then carried superiorly along the superior aspect of the hyoid bone.  The attached muscles were carefully dissected and the hyoid bone was skeletonized in continuity with the cystic mass. A double-action  bone cutting forceps was then used to cut the central- third of the hyoid bone, which was then resected along with the cystic mass and sent to Pathology for gross microscopic evaluation.  Careful dissection of the area showed no evidence of tract or cystic extension into the tongue base or inferiorly into the thyroid region.  The mass was then completely resected and sent to Pathology for gross and microscopic evaluation.  The wound was irrigated copiously.  Several small areas of point hemorrhage were cauterized with Bovie electrocautery and suture ligature.  The wound was then closed in multiple layers after placing a 10-French round drain at the base of the incision.  The strap muscles were reapproximated in the midline using interrupted 3-0 Vicryl suture.  The supraglottic musculature was then closed.  Deep subcutaneous musculature was closed with interrupted 4-0 Vicryl and final skin edge closure achieved with Dermabond surgical glue.  The patient was then awakened from his anesthetic.  He was extubated and was transferred from the operating room to recovery room in stable condition.  There were no complications.  Estimated blood loss was minimal.          ______________________________ Kinnie Scales. Annalee Genta, M.D.     DLS/MEDQ  D:  [id number]  T:  07/01/2011  Job:  [id number]

## 2012-05-28 ENCOUNTER — Emergency Department (HOSPITAL_COMMUNITY)
Admission: EM | Admit: 2012-05-28 | Discharge: 2012-05-28 | Payer: Medicaid Other | Attending: Emergency Medicine | Admitting: Emergency Medicine

## 2012-05-28 ENCOUNTER — Encounter (HOSPITAL_COMMUNITY): Payer: Self-pay | Admitting: Emergency Medicine

## 2012-05-28 ENCOUNTER — Emergency Department (HOSPITAL_COMMUNITY): Payer: Medicaid Other

## 2012-05-28 DIAGNOSIS — F259 Schizoaffective disorder, unspecified: Secondary | ICD-10-CM | POA: Insufficient documentation

## 2012-05-28 DIAGNOSIS — W19XXXA Unspecified fall, initial encounter: Secondary | ICD-10-CM

## 2012-05-28 DIAGNOSIS — Z79899 Other long term (current) drug therapy: Secondary | ICD-10-CM | POA: Insufficient documentation

## 2012-05-28 DIAGNOSIS — Y939 Activity, unspecified: Secondary | ICD-10-CM | POA: Insufficient documentation

## 2012-05-28 DIAGNOSIS — S4980XA Other specified injuries of shoulder and upper arm, unspecified arm, initial encounter: Secondary | ICD-10-CM | POA: Insufficient documentation

## 2012-05-28 DIAGNOSIS — I10 Essential (primary) hypertension: Secondary | ICD-10-CM | POA: Insufficient documentation

## 2012-05-28 DIAGNOSIS — H40009 Preglaucoma, unspecified, unspecified eye: Secondary | ICD-10-CM | POA: Insufficient documentation

## 2012-05-28 DIAGNOSIS — M545 Low back pain: Secondary | ICD-10-CM

## 2012-05-28 DIAGNOSIS — Y929 Unspecified place or not applicable: Secondary | ICD-10-CM | POA: Insufficient documentation

## 2012-05-28 DIAGNOSIS — W010XXA Fall on same level from slipping, tripping and stumbling without subsequent striking against object, initial encounter: Secondary | ICD-10-CM | POA: Insufficient documentation

## 2012-05-28 DIAGNOSIS — IMO0002 Reserved for concepts with insufficient information to code with codable children: Secondary | ICD-10-CM | POA: Insufficient documentation

## 2012-05-28 DIAGNOSIS — M25519 Pain in unspecified shoulder: Secondary | ICD-10-CM

## 2012-05-28 DIAGNOSIS — Z8719 Personal history of other diseases of the digestive system: Secondary | ICD-10-CM | POA: Insufficient documentation

## 2012-05-28 DIAGNOSIS — S46909A Unspecified injury of unspecified muscle, fascia and tendon at shoulder and upper arm level, unspecified arm, initial encounter: Secondary | ICD-10-CM | POA: Insufficient documentation

## 2012-05-28 NOTE — ED Provider Notes (Signed)
History     CSN: [id number]  Arrival date & time 05/28/12  1209   First MD Initiated Contact with Patient 05/28/12 1240      Chief Complaint  Patient presents with  . Back Pain  . Shoulder Pain  . Fall    (Consider location/radiation/quality/duration/timing/severity/associated sxs/prior treatment) HPI Comments: Patient presents today from [facility] with a chief complaint of right shoulder pain and lower back pain after a fall just prior to arrival.  He slipped on a sheet and fell and landed on his right shoulder and lower back.  Fell from standing unto a hard floor.  He did not hit his head.  He states that he is currently having pain in his lower back and pain of the right shoulder.  He has not taken anything for pain prior to arrival.  No swelling of the shoulder.    Patient is a 57 y.o. male presenting with back pain and fall. The history is provided by the patient.  Back Pain Location:  Lumbar spine Radiates to:  Does not radiate Timing:  Constant Progression:  Improving Context: falling   Worsened by:  Bending Associated symptoms: no bladder incontinence, no bowel incontinence, no leg pain, no numbness, no paresthesias and no tingling   Fall There was no blood loss. He was ambulatory at the scene. Pertinent negatives include no visual change, no numbness, no bowel incontinence, no vomiting, no loss of consciousness and no tingling.    Past Medical History  Diagnosis Date  . Hypertension   . Glaucoma(365)   . GERD (gastroesophageal reflux disease)   . Schizophrenia, schizo-affective   . Schizoaffective disorder, depressive type     "dx'd in the 1990's"    Past Surgical History  Procedure Laterality Date  . Incision and drainage / excision thyroglossal cyst  06/30/11    w/central hyoid resection  . Fracture surgery  1996    jaw   . Thyroglossal duct cyst  06/30/2011    Procedure: THYROGLOSSAL DUCT CYST;  Surgeon: Osborn Coho, MD;  Location: [facility] OR;  Service: ENT;   Laterality: N/A;  excision of thyroglossal duct cyst    No family history on file.  History  Substance Use Topics  . Smoking status: Never Smoker   . Smokeless tobacco: Never Used  . Alcohol Use: Yes     Comment: "stopped drinking 1999"      Review of Systems  Gastrointestinal: Negative for vomiting and bowel incontinence.  Genitourinary: Negative for bladder incontinence.  Musculoskeletal: Positive for back pain.       Right shoulder pain  Neurological: Negative for tingling, loss of consciousness, numbness and paresthesias.  All other systems reviewed and are negative.    Allergies  Review of patient's allergies indicates no known allergies.  Home Medications   Current Outpatient Rx  Name  Route  Sig  Dispense  Refill  . amLODipine (NORVASC) 2.5 MG tablet   Oral   Take 2.5 mg by mouth daily.         Marland Kitchen atenolol (TENORMIN) 25 MG tablet   Oral   Take 25 mg by mouth daily.          Marland Kitchen lisinopril-hydrochlorothiazide (PRINZIDE,ZESTORETIC) 20-25 MG per tablet   Oral   Take 1 tablet by mouth daily.         Marland Kitchen OLANZapine (ZYPREXA) 15 MG tablet   Oral   Take 30 mg by mouth at bedtime.         Marland Kitchen PRESCRIPTION MEDICATION  Patient takes along with Zyprexa  receives from [employer] [facility] , unable to verify pharmacy is closed         . timolol (BETIMOL) 0.5 % ophthalmic solution   Both Eyes   Place 1 drop into both eyes 2 (two) times daily.             BP 137/94  Pulse 75  Temp(Src) 97.7 F (36.5 C) (Oral)  Resp 16  SpO2 100%  Physical Exam  Nursing note and vitals reviewed. Constitutional: He appears well-developed and well-nourished. No distress.  HENT:  Head: Normocephalic and atraumatic.  Mouth/Throat: Oropharynx is clear and moist.  Neck: Normal range of motion. Neck supple.  Cardiovascular: Normal rate, regular rhythm and normal heart sounds.   Pulmonary/Chest: Effort normal and breath sounds normal.  Musculoskeletal: Normal range of  motion.       Cervical back: He exhibits normal range of motion, no tenderness, no bony tenderness, no swelling, no edema and no deformity.       Thoracic back: He exhibits normal range of motion, no tenderness, no bony tenderness, no swelling, no edema and no deformity.       Lumbar back: He exhibits tenderness and bony tenderness. He exhibits normal range of motion, no swelling, no edema and no deformity.       Right upper arm: He exhibits no tenderness, no bony tenderness, no swelling, no edema and no deformity.       Left upper arm: He exhibits no swelling, no edema and no deformity.  Neurological: He is alert. He has normal strength. No sensory deficit. Gait normal.  Skin: Skin is warm, dry and intact. No abrasion, no bruising and no ecchymosis noted. He is not diaphoretic.  Psychiatric: He has a normal mood and affect.    ED Course  Procedures (including critical care time)  Labs Reviewed - No data to display Dg Lumbar Spine Complete  05/28/2012  *RADIOLOGY REPORT*  Clinical Data: Fall, back pain  LUMBAR SPINE - COMPLETE 4+ VIEW  Comparison: None.  Findings: Five views of the lumbar spine submitted.  No acute fracture or subluxation.  There is disc space flattening with anterior spurring at L5-S1 level. Facet degenerative changes at L4 and L5 level.  IMPRESSION: No acute fracture or subluxation.  Degenerative changes at L5-S1 level.  Facet degenerative changes L4 and L5 level.   Original Report Authenticated By: Natasha Mead, M.D.    Dg Shoulder Right  05/28/2012  *RADIOLOGY REPORT*  Clinical Data: Right shoulder pain.  Fall.  RIGHT SHOULDER - 2+ VIEW  Comparison: None.  Findings: No acute fracture or dislocation.  Mild degenerative irregularity of the acromioclavicular joint. Visualized portion of the right hemithorax is normal.  IMPRESSION: No acute osseous abnormality.   Original Report Authenticated By: Jeronimo Greaves, M.D.      No diagnosis found.    MDM  Patient presenting with  lower back pain and shoulder pain after a fall just prior to arrival while at [facility].  Xrays negative.  Patient is neurovascularly intact.  Patient able to ambulate without difficulty.  Therefore, feel that the patient is stable for discharge back to [facility].        Pascal Lux [place], PA-C 05/28/12 1556

## 2012-05-28 NOTE — ED Notes (Signed)
Pt from [facility].  Pt fell.  C/o low back pain, rt shoulder pain 10/10.  PMS intact.

## 2012-05-29 NOTE — ED Provider Notes (Signed)
Medical screening examination/treatment/procedure(s) were performed by non-physician practitioner and as supervising physician I was immediately available for consultation/collaboration.  Christopher J. Pollina, MD 05/29/12 0752 

## 2012-06-18 ENCOUNTER — Emergency Department (HOSPITAL_COMMUNITY)
Admission: EM | Admit: 2012-06-18 | Discharge: 2012-06-18 | Discharge status: Against Medical Advice | Payer: Medicaid Other | Attending: Emergency Medicine | Admitting: Emergency Medicine

## 2012-06-18 ENCOUNTER — Encounter (HOSPITAL_COMMUNITY): Payer: Self-pay | Admitting: *Deleted

## 2012-06-18 DIAGNOSIS — M545 Low back pain, unspecified: Secondary | ICD-10-CM | POA: Insufficient documentation

## 2012-06-18 DIAGNOSIS — I10 Essential (primary) hypertension: Secondary | ICD-10-CM | POA: Insufficient documentation

## 2012-06-18 MED ORDER — OXYCODONE-ACETAMINOPHEN 5-325 MG PO TABS
1.0000 | ORAL_TABLET | Freq: Once | ORAL | Status: DC
  Filled 2012-06-18: qty 1

## 2012-06-18 NOTE — ED Notes (Signed)
Pt states he fell 2 weeks ago and went to [facility] where they x-rayed his back.  Was told x-rays were normal.  This morning, when sitting on toilet, pain came back and has not gone away since.  Pt states pain is excruciating and unbearable.

## 2012-06-18 NOTE — ED Notes (Signed)
I entered the room with pain medication, pt was fully dressed and stated "I'm leaving, I've been here for 3 hours with no service".  Explained wait to pt and that I was bringing pain medication.  Pt stated "You can keep it" and proceeded to walk out the door.

## 2012-06-18 NOTE — ED Notes (Signed)
Pt st's he fell 1 week ago and pain pain in lower back but pain subsided.  St's he woke up this am with pain in lower back radiating down left leg

## 2012-07-05 ENCOUNTER — Encounter (HOSPITAL_COMMUNITY): Payer: Self-pay | Admitting: *Deleted

## 2012-07-05 ENCOUNTER — Emergency Department (HOSPITAL_COMMUNITY): Payer: Medicaid Other

## 2012-07-05 ENCOUNTER — Inpatient Hospital Stay (HOSPITAL_COMMUNITY)
Admission: EM | Admit: 2012-07-05 | Discharge: 2012-07-07 | DRG: 917 | Disposition: A | Discharge status: Other Acute Inpatient Hospital | Payer: Medicaid Other | Attending: Internal Medicine | Admitting: Internal Medicine

## 2012-07-05 DIAGNOSIS — T1491XA Suicide attempt, initial encounter: Secondary | ICD-10-CM

## 2012-07-05 DIAGNOSIS — G92 Toxic encephalopathy: Secondary | ICD-10-CM | POA: Diagnosis present

## 2012-07-05 DIAGNOSIS — J9601 Acute respiratory failure with hypoxia: Secondary | ICD-10-CM | POA: Diagnosis present

## 2012-07-05 DIAGNOSIS — I10 Essential (primary) hypertension: Secondary | ICD-10-CM | POA: Diagnosis present

## 2012-07-05 DIAGNOSIS — R509 Fever, unspecified: Secondary | ICD-10-CM

## 2012-07-05 DIAGNOSIS — H409 Unspecified glaucoma: Secondary | ICD-10-CM

## 2012-07-05 DIAGNOSIS — T43592A Poisoning by other antipsychotics and neuroleptics, intentional self-harm, initial encounter: Secondary | ICD-10-CM

## 2012-07-05 DIAGNOSIS — F259 Schizoaffective disorder, unspecified: Secondary | ICD-10-CM | POA: Diagnosis present

## 2012-07-05 DIAGNOSIS — F200 Paranoid schizophrenia: Secondary | ICD-10-CM | POA: Diagnosis present

## 2012-07-05 DIAGNOSIS — G9340 Encephalopathy, unspecified: Secondary | ICD-10-CM

## 2012-07-05 DIAGNOSIS — Y92009 Unspecified place in unspecified non-institutional (private) residence as the place of occurrence of the external cause: Secondary | ICD-10-CM

## 2012-07-05 DIAGNOSIS — G929 Unspecified toxic encephalopathy: Secondary | ICD-10-CM | POA: Diagnosis present

## 2012-07-05 DIAGNOSIS — T43502A Poisoning by unspecified antipsychotics and neuroleptics, intentional self-harm, initial encounter: Secondary | ICD-10-CM | POA: Diagnosis present

## 2012-07-05 DIAGNOSIS — Q892 Congenital malformations of other endocrine glands: Secondary | ICD-10-CM

## 2012-07-05 DIAGNOSIS — J9600 Acute respiratory failure, unspecified whether with hypoxia or hypercapnia: Secondary | ICD-10-CM | POA: Diagnosis present

## 2012-07-05 DIAGNOSIS — F209 Schizophrenia, unspecified: Secondary | ICD-10-CM

## 2012-07-05 DIAGNOSIS — T50901A Poisoning by unspecified drugs, medicaments and biological substances, accidental (unintentional), initial encounter: Secondary | ICD-10-CM

## 2012-07-05 DIAGNOSIS — K219 Gastro-esophageal reflux disease without esophagitis: Secondary | ICD-10-CM

## 2012-07-05 DIAGNOSIS — J690 Pneumonitis due to inhalation of food and vomit: Secondary | ICD-10-CM | POA: Diagnosis present

## 2012-07-05 DIAGNOSIS — T438X2A Poisoning by other psychotropic drugs, intentional self-harm, initial encounter: Secondary | ICD-10-CM | POA: Diagnosis present

## 2012-07-05 DIAGNOSIS — T43501A Poisoning by unspecified antipsychotics and neuroleptics, accidental (unintentional), initial encounter: Principal | ICD-10-CM | POA: Diagnosis present

## 2012-07-05 DIAGNOSIS — F411 Generalized anxiety disorder: Secondary | ICD-10-CM | POA: Diagnosis present

## 2012-07-05 DIAGNOSIS — Z79899 Other long term (current) drug therapy: Secondary | ICD-10-CM

## 2012-07-05 LAB — PROTIME-INR: INR: 1.02 (ref 0.00–1.49)

## 2012-07-05 LAB — LIPASE, BLOOD: Lipase: 34 U/L (ref 11–59)

## 2012-07-05 LAB — CBC WITH DIFFERENTIAL/PLATELET
Basophils Relative: 0 % (ref 0–1)
Eosinophils Absolute: 0 10*3/uL (ref 0.0–0.7)
MCH: 28.4 pg (ref 26.0–34.0)
MCHC: 34.3 g/dL (ref 30.0–36.0)
Neutrophils Relative %: 52 % (ref 43–77)
Platelets: 214 10*3/uL (ref 150–400)
RBC: 4.62 MIL/uL (ref 4.22–5.81)

## 2012-07-05 LAB — BASIC METABOLIC PANEL
BUN: 15 mg/dL (ref 6–23)
Calcium: 9.2 mg/dL (ref 8.4–10.5)
Chloride: 109 mEq/L (ref 96–112)
Creatinine, Ser: 0.97 mg/dL (ref 0.50–1.35)
GFR calc Af Amer: >90 mL/min (ref >90)
GFR calc non Af Amer: >90 mL/min (ref >90)

## 2012-07-05 LAB — URINALYSIS, ROUTINE W REFLEX MICROSCOPIC
Glucose, UA: NEGATIVE mg/dL
Leukocytes, UA: NEGATIVE
Nitrite: NEGATIVE
Protein, ur: NEGATIVE mg/dL
pH: 7.5 (ref 5.0–8.0)

## 2012-07-05 LAB — ETHANOL: Alcohol, Ethyl (B): <11 mg/dL (ref 0–11)

## 2012-07-05 LAB — ACETAMINOPHEN LEVEL
Acetaminophen (Tylenol), Serum: <15 ug/mL (ref 10–30)
Acetaminophen (Tylenol), Serum: <15 ug/mL (ref 10–30)

## 2012-07-05 LAB — GLUCOSE, CAPILLARY: Glucose-Capillary: 99 mg/dL (ref 70–99)

## 2012-07-05 LAB — MAGNESIUM: Magnesium: 2.5 mg/dL (ref 1.5–2.5)

## 2012-07-05 LAB — COMPREHENSIVE METABOLIC PANEL
BUN: 23 mg/dL (ref 6–23)
Calcium: 10.1 mg/dL (ref 8.4–10.5)
GFR calc Af Amer: 76 mL/min — ABNORMAL LOW (ref >90)
Glucose, Bld: 128 mg/dL — ABNORMAL HIGH (ref 70–99)
Total Protein: 7.8 g/dL (ref 6.0–8.3)

## 2012-07-05 LAB — POCT I-STAT 3, ART BLOOD GAS (G3+)
pCO2 arterial: 45.7 mmHg — ABNORMAL HIGH (ref 35.0–45.0)
pH, Arterial: 7.366 (ref 7.350–7.450)

## 2012-07-05 LAB — RAPID URINE DRUG SCREEN, HOSP PERFORMED: Amphetamines: NOT DETECTED

## 2012-07-05 LAB — SALICYLATE LEVEL: Salicylate Lvl: <2 mg/dL — ABNORMAL LOW (ref 2.8–20.0)

## 2012-07-05 LAB — MRSA PCR SCREENING: MRSA by PCR: NEGATIVE

## 2012-07-05 MED ORDER — PROPOFOL 10 MG/ML IV EMUL
5.0000 ug/kg/min | INTRAVENOUS | Status: DC
  Administered 2012-07-05: 30 ug/kg/min via INTRAVENOUS

## 2012-07-05 MED ORDER — TIMOLOL HEMIHYDRATE 0.5 % OP SOLN: 1.0000 [drp] | Freq: Two times a day (BID) | OPHTHALMIC | Status: DC

## 2012-07-05 MED ORDER — SODIUM CHLORIDE 0.9 % IV SOLN
2.0000 mg/h | INTRAVENOUS | Status: DC
  Filled 2012-07-05: qty 10

## 2012-07-05 MED ORDER — ROCURONIUM BROMIDE 50 MG/5ML IV SOLN
INTRAVENOUS | Status: AC
  Filled 2012-07-05: qty 2

## 2012-07-05 MED ORDER — SODIUM CHLORIDE 0.9 % IV BOLUS (SEPSIS)
250.0000 mL | Freq: Once | INTRAVENOUS | Status: AC
  Administered 2012-07-05: 250 mL via INTRAVENOUS

## 2012-07-05 MED ORDER — MIDAZOLAM HCL 2 MG/2ML IJ SOLN
INTRAMUSCULAR | Status: AC
  Filled 2012-07-05: qty 4

## 2012-07-05 MED ORDER — LIDOCAINE HCL (CARDIAC) 20 MG/ML IV SOLN
INTRAVENOUS | Status: AC
  Administered 2012-07-05: 100 mg
  Filled 2012-07-05: qty 5

## 2012-07-05 MED ORDER — TIMOLOL MALEATE 0.5 % OP SOLN
1.0000 [drp] | Freq: Two times a day (BID) | OPHTHALMIC | Status: DC
  Administered 2012-07-05 – 2012-07-07 (×4): 1 [drp] via OPHTHALMIC
  Filled 2012-07-05 (×2): qty 5

## 2012-07-05 MED ORDER — POTASSIUM CHLORIDE 20 MEQ/15ML (10%) PO LIQD
40.0000 meq | Freq: Once | ORAL | Status: AC
  Administered 2012-07-05: 40 meq
  Filled 2012-07-05: qty 30

## 2012-07-05 MED ORDER — POTASSIUM CHLORIDE 20 MEQ/15ML (10%) PO LIQD
40.0000 meq | Freq: Once | ORAL | Status: DC
  Filled 2012-07-05: qty 30

## 2012-07-05 MED ORDER — CHLORHEXIDINE GLUCONATE 0.12 % MT SOLN
15.0000 mL | Freq: Two times a day (BID) | OROMUCOSAL | Status: DC
  Administered 2012-07-05: 15 mL via OROMUCOSAL
  Filled 2012-07-05: qty 15

## 2012-07-05 MED ORDER — OSMOLITE 1.2 CAL PO LIQD
1000.0000 mL | ORAL | Status: DC
  Administered 2012-07-05: 1000 mL
  Filled 2012-07-05 (×2): qty 1000

## 2012-07-05 MED ORDER — PROPOFOL 10 MG/ML IV EMUL
5.0000 ug/kg/min | INTRAVENOUS | Status: DC
  Filled 2012-07-05: qty 100

## 2012-07-05 MED ORDER — PRO-STAT SUGAR FREE PO LIQD
30.0000 mL | Freq: Two times a day (BID) | ORAL | Status: DC
  Administered 2012-07-05: 30 mL
  Filled 2012-07-05 (×2): qty 30

## 2012-07-05 MED ORDER — NALOXONE HCL 0.4 MG/ML IJ SOLN
INTRAMUSCULAR | Status: AC
  Administered 2012-07-05: 0.4 mg via INTRAVENOUS
  Filled 2012-07-05: qty 1

## 2012-07-05 MED ORDER — PANTOPRAZOLE SODIUM 40 MG IV SOLR
40.0000 mg | INTRAVENOUS | Status: DC
  Administered 2012-07-05 – 2012-07-07 (×3): 40 mg via INTRAVENOUS
  Filled 2012-07-05 (×5): qty 40

## 2012-07-05 MED ORDER — MIDAZOLAM HCL 5 MG/5ML IJ SOLN
5.0000 mg | Freq: Once | INTRAMUSCULAR | Status: AC
  Administered 2012-07-05: 5 mg via INTRAVENOUS

## 2012-07-05 MED ORDER — PROPOFOL 10 MG/ML IV EMUL
INTRAVENOUS | Status: AC
  Filled 2012-07-05: qty 100

## 2012-07-05 MED ORDER — SUCCINYLCHOLINE CHLORIDE 20 MG/ML IJ SOLN
INTRAMUSCULAR | Status: AC
  Administered 2012-07-05: 20 mg
  Filled 2012-07-05: qty 1

## 2012-07-05 MED ORDER — NALOXONE HCL 0.4 MG/ML IJ SOLN: 0.4000 mg | Freq: Once | INTRAMUSCULAR | Status: AC

## 2012-07-05 MED ORDER — ETOMIDATE 2 MG/ML IV SOLN
INTRAVENOUS | Status: AC
  Administered 2012-07-05: 20 mg
  Filled 2012-07-05: qty 20

## 2012-07-05 MED ORDER — MIDAZOLAM HCL 2 MG/2ML IJ SOLN
4.0000 mg | Freq: Once | INTRAMUSCULAR | Status: AC
  Administered 2012-07-05: 4 mg via INTRAVENOUS

## 2012-07-05 MED ORDER — BIOTENE DRY MOUTH MT LIQD
1.0000 "application " | Freq: Four times a day (QID) | OROMUCOSAL | Status: DC
  Administered 2012-07-05 – 2012-07-07 (×9): 15 mL via OROMUCOSAL

## 2012-07-05 MED ORDER — SODIUM CHLORIDE 0.9 % IV SOLN
50.0000 ug/h | INTRAVENOUS | Status: DC
  Filled 2012-07-05: qty 50

## 2012-07-05 MED ORDER — SODIUM CHLORIDE 0.9 % IV SOLN
INTRAVENOUS | Status: DC
  Administered 2012-07-05 – 2012-07-06 (×5): via INTRAVENOUS
  Administered 2012-07-07: 1000 mL via INTRAVENOUS

## 2012-07-05 MED ORDER — LABETALOL HCL 5 MG/ML IV SOLN
20.0000 mg | INTRAVENOUS | Status: DC | PRN
  Administered 2012-07-05: 10 mg via INTRAVENOUS
  Filled 2012-07-05 (×2): qty 4

## 2012-07-05 MED ORDER — OSMOLITE 1.2 CAL PO LIQD
1000.0000 mL | ORAL | Status: DC
  Filled 2012-07-05 (×2): qty 1000

## 2012-07-05 MED ORDER — MIDAZOLAM HCL 2 MG/2ML IJ SOLN
INTRAMUSCULAR | Status: AC
  Filled 2012-07-05: qty 6

## 2012-07-05 MED ORDER — METOCLOPRAMIDE HCL 5 MG/5ML PO SOLN
10.0000 mg | Freq: Four times a day (QID) | ORAL | Status: DC
  Administered 2012-07-05: 10 mg
  Filled 2012-07-05 (×4): qty 10

## 2012-07-05 MED ORDER — HEPARIN SODIUM (PORCINE) 5000 UNIT/ML IJ SOLN
5000.0000 [IU] | Freq: Three times a day (TID) | INTRAMUSCULAR | Status: DC
  Administered 2012-07-05 – 2012-07-07 (×7): 5000 [IU] via SUBCUTANEOUS
  Filled 2012-07-05 (×10): qty 1

## 2012-07-05 MED ORDER — PROPOFOL 10 MG/ML IV EMUL: 5.0000 ug/kg/min | Freq: Once | INTRAVENOUS | Status: DC

## 2012-07-05 NOTE — H&P (Signed)
PULMONARY  / CRITICAL CARE MEDICINE  Name: Jimmy Marshall MRN: [id number] DOB: 08-Apr-1955    ADMISSION DATE:  07/05/2012 CONSULTATION DATE:  07/05/2012  REFERRING MD :  EDP PRIMARY SERVICE:  PCCM  CHIEF COMPLAINT:  Acute encephalopathy  BRIEF PATIENT DESCRIPTION: 57 yo with history of schizophrenia who took 20 tabs of Zyprexa after stating "tonight we are going to die".  Brought to [facility] ED and intubated for airway protection.  SIGNIFICANT EVENTS / STUDIES:  4/1  Intentional overdose of Zyprexa, intubated for airway protection  LINES / TUBES: OETT 4/1 >>> OGT 4/1 >>> Foley 4/1 >>>  CULTURES:  ANTIBIOTICS:  The patient is encephalopathic and unable to provide history, which was obtained for available medical records.  HISTORY OF PRESENT ILLNESS:  57 yo with history of schizophrenia who took 20 tabs of Zyprexa after stating "tonight we are going to die".  Brought to [facility] ED and intubated for airway protection.  PAST MEDICAL HISTORY :  Past Medical History  Diagnosis Date  . Hypertension   . Glaucoma(365)   . GERD (gastroesophageal reflux disease)   . Schizophrenia, schizo-affective   . Schizoaffective disorder, depressive type     "dx'd in the 1990's"   Past Surgical History  Procedure Laterality Date  . Incision and drainage / excision thyroglossal cyst  06/30/11    w/central hyoid resection  . Fracture surgery  1996    jaw   . Thyroglossal duct cyst  06/30/2011    Procedure: THYROGLOSSAL DUCT CYST;  Surgeon: Osborn Coho, MD;  Location: [facility] OR;  Service: ENT;  Laterality: N/A;  excision of thyroglossal duct cyst   Prior to Admission medications   Medication Sig Start Date End Date Taking? Authorizing Provider  amLODipine (NORVASC) 2.5 MG tablet Take 2.5 mg by mouth daily.    Historical Provider, MD  atenolol (TENORMIN) 25 MG tablet Take 25 mg by mouth daily.     Historical Provider, MD  lisinopril-hydrochlorothiazide (PRINZIDE,ZESTORETIC) 20-25 MG per tablet Take 1  tablet by mouth daily.    Historical Provider, MD  OLANZapine (ZYPREXA) 15 MG tablet Take 30 mg by mouth at bedtime.    Historical Provider, MD  timolol (BETIMOL) 0.5 % ophthalmic solution Place 1 drop into both eyes 2 (two) times daily.      Historical Provider, MD   No Known Allergies  FAMILY HISTORY:  History reviewed. No pertinent family history.  SOCIAL HISTORY:  reports that he has never smoked. He has never used smokeless tobacco. He reports that  drinks alcohol. 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  REVIEW OF SYSTEMS:  Unable to provide.  INTERVAL HISTORY:  VITAL SIGNS: Temp:  [95.9 F (35.5 C)-98.4 F (36.9 C)] 97.9 F (36.6 C) (04/01 0415) Pulse Rate:  [109-148] 135 (04/01 0415) Resp:  [16-28] 18 (04/01 0415) BP: (126-171)/(81-131) 169/131 mmHg (04/01 0415) SpO2:  [91 %-99 %] 99 % (04/01 0415) FiO2 (%):  [60 %] 60 % (04/01 0355) Weight:  [95.2 kg (209 lb 14.1 oz)] 95.2 kg (209 lb 14.1 oz) (04/01 0415)  HEMODYNAMICS:   VENTILATOR SETTINGS: Vent Mode:  [-] PRVC FiO2 (%):  [60 %] 60 % Set Rate:  [14 bmp] 14 bmp Vt Set:  [550 mL] 550 mL PEEP:  [5 cmH20] 5 cmH20 INTAKE / OUTPUT: Intake/Output   None     PHYSICAL EXAMINATION: General:  Mechanically ventilated, synchronous Neuro:  Encephalopathic, nonfocal, cough / gag diminished HEENT:  PERRL, OETT / OGT Cardiovascular:  RRR, no  m/r/g Lungs:  Bilateral diminished air entry, no w/r/r Abdomen:  Soft, nontender, bowel sounds diminished Musculoskeletal:  Moves all extremities, no edema Skin:  Intact  LABS: No results found for this basename: HGB, WBC, PLT, NA, K, CL, CO2, GLUCOSE, BUN, CREATININE, CALCIUM, MG, PHOS, AST, ALT, ALKPHOS, BILITOT, PROT, ALBUMIN, APTT, INR, LATICACIDVEN, TROPONINI, PROCALCITON, PROBNP, O2SATVEN, PHART, PCO2ART, PO2ART,  in the last 168 hours  Recent Labs Lab 07/05/12 0322  GLUCAP 111*   CXR:  4/1 >>> Hardware in place, no overt airspace  disease  ASSESSMENT / PLAN:  PULMONARY A:  Acute respiratory failure in setting of drug overdose. P:   Gaol SpO2>92, pH>7.30 Full mechanical support Daily SBT Trend ABG / CXR  CARDIOVASCULAR A: Hypertension. P:  Goal SBP<150, DBP<110 Labetalol IV PRN Hold Norvasc, Atenolol, HCTZ, Lisinopril  RENAL A:  No active issues. P:   NS@100  Trend BMP  GASTROINTESTINAL A:  No active issues. P:   NPO as intubated Protonix for GI Px  HEMATOLOGIC A:  No active issues. P:  Trend CBC Heparin for DVT Px  INFECTIOUS A:  No active issues. P:   No intervention required  ENDOCRINE  A:  No active issues. P:   No intervention required  NEUROLOGIC A:  Acute encephalopathy.  Zyprexa overdose.  Tox screen / acetaminophen / salicylate levels pending, sent 4 AM.   P:   Resend acetaminophen / salicylate level at 8 AM per poison control Review tox screen when available Goal RASS 0 to -1 Propofol gtt Psychiatry consult when extubated  TODAY'S SUMMARY: 57 yo with Schizophrenia and intentional overdose of Zyprexa, intubated for airway protection.  Follow acetaminophen / salicylate level at 8 AM.  Psychiatry consult when extubated.  I have personally obtained a history, examined the patient, evaluated laboratory and imaging results, formulated the assessment and plan and placed orders.  CRITICAL CARE:  The patient is critically ill with multiple organ systems failure and requires high complexity decision making for assessment and support, frequent evaluation and titration of therapies, application of advanced monitoring technologies and extensive interpretation of multiple databases. Critical Care Time devoted to patient care services described in this note is 45 minutes.   Lonia Farber, MD Pulmonary and Critical Care Medicine [facility] Pager: [phone]  07/05/2012, 4:33 AM

## 2012-07-05 NOTE — Progress Notes (Signed)
UR Completed.  Kase Shughart Jane [phone] 07/05/2012  

## 2012-07-05 NOTE — Progress Notes (Signed)
PULMONARY  / CRITICAL CARE MEDICINE  Name: Jimmy Marshall MRN: [id number] DOB: [birth year]    ADMISSION DATE:  07/05/2012 CONSULTATION DATE:  07/05/2012  REFERRING MD :  EDP PRIMARY SERVICE:  PCCM  CHIEF COMPLAINT:  Acute encephalopathy  BRIEF PATIENT DESCRIPTION: 57 yo with history of schizophrenia who took 20 tabs of Zyprexa after stating "tonight we are going to die".  Brought to [facility] ED and intubated for airway protection.  SIGNIFICANT EVENTS / STUDIES:  4/1  Intentional overdose of Zyprexa, intubated for airway protection  LINES / TUBES: OETT 4/1 >>> OGT 4/1 >>> Foley 4/1 >>>  CULTURES: None at this time  ANTIBIOTICS: None at this time  VITAL SIGNS: Temp:  [95.9 F (35.5 C)-98.8 F (37.1 C)] 98.8 F (37.1 C) (04/01 0800) Pulse Rate:  [109-148] 116 (04/01 0800) Resp:  [9-28] 13 (04/01 0800) BP: (111-171)/(81-131) 131/96 mmHg (04/01 0800) SpO2:  [91 %-100 %] 100 % (04/01 0800) FiO2 (%):  [40 %-60 %] 40 % (04/01 0800) Weight:  [183 lb 13.8 oz (83.4 kg)-209 lb 14.1 oz (95.2 kg)] 183 lb 13.8 oz (83.4 kg) (04/01 0500)  HEMODYNAMICS:   VENTILATOR SETTINGS: Vent Mode:  [-] PSV;CPAP FiO2 (%):  [40 %-60 %] 40 % Set Rate:  [14 bmp] 14 bmp Vt Set:  [550 mL] 550 mL PEEP:  [5 cmH20] 5 cmH20 Pressure Support:  [5 cmH20] 5 cmH20 INTAKE / OUTPUT: Intake/Output     03/31 0701 - 04/01 0700 04/01 0701 - 04/02 0700   I.V. (mL/kg) 1301.3 (15.6) 107.5 (1.3)   NG/GT 30    Total Intake(mL/kg) 1331.3 (16) 107.5 (1.3)   Urine (mL/kg/hr) 1250 400 (1.5)   Total Output 1250 400   Net +81.3 -292.5          PHYSICAL EXAMINATION: General:  Mechanically ventilated, synchronous Neuro:  Sedated. rass -3 nonfocal, cough / gag diminished HEENT:  PERRL, OETT / OGT Cardiovascular:  RRR, no m/r/g Lungs: Mechanical breath sounds, no w/r/r Abdomen:  Soft, nontender, bowel sounds diminished Musculoskeletal: Sedated. Trace pretibial edema Skin:  Intact  LABS:  Recent Labs Lab  07/05/12 0405 07/05/12 0443 07/05/12 0447  HGB  --  13.1  --   WBC  --  5.7  --   PLT  --  214  --   NA 144  --   --   K 3.0*  --   --   CL 105  --   --   CO2 26  --   --   GLUCOSE 128*  --   --   BUN 23  --   --   CREATININE 1.22  --   --   CALCIUM 10.1  --   --   AST 25  --   --   ALT 29  --   --   ALKPHOS 55  --   --   BILITOT 0.4  --   --   PROT 7.8  --   --   ALBUMIN 4.6  --   --   INR 1.02  --   --   PHART  --   --  7.366  PCO2ART  --   --  45.7*  PO2ART  --   --  135.0*    Recent Labs Lab 07/05/12 0322  GLUCAP 111*   CXR:  4/1 >>> Hardware in place, no overt airspace disease  ASSESSMENT / PLAN:  PULMONARY A:  Acute respiratory failure in setting of drug overdose. P:   -ABg  reviewed, keep same MV on prvc -wean today cpap 5 ps 5, goal 2 hrs then escalate ps if not extubated -requires improvement in  neurostatus to extubate  CARDIOVASCULAR A: Hypertension. Improving. SBP 125-131 and DBP 94-96 this AM, HIGH risk for qtc prolongation P:  Goal SBP<150, DBP<110 Labetalol IV PRN Hold Norvasc, Atenolol, HCTZ, Lisinopril - remains normotensive Avoid drugs that prolong QTC ECG q8h for qtc Keep k , mag wnl  RENAL A:  Baseline Cr ~1.2. CKD stage 3 1) Hypokalemia. K=3 P:   -NS@100  -Trend BMP -Check Mg and Phosp  GASTROINTESTINAL A: start T F P:   NPO, start T F Protonix for GI Px LFt wnl  HEMATOLOGIC A:  DVt prevention P:  Trend CBC Heparin for DVT Px  INFECTIOUS A:  No active issues. P:   No intervention required pcxr in am for asp  ENDOCRINE  A:  No active issues P:   No intervention required  NEUROLOGIC A: 1) Acute encephalopathy.  Zyprexa overdose.   2) acetaminophen neg and salicylate levels neg x 2(4AM&8AM),ethanol <11, UDS neg , drugs of abuse (urine) not collected 3) Nl LFTs 4) RASS -2 P:   -Review drugs of abuse when available -Goal RASS 0 to -1 -Propofol gtt -Psychiatry consult when extubated  TODAY'S SUMMARY: 57 yo  with Schizophrenia and intentional overdose of Zyprexa, intubated for airway protection.  Follow drugs of abuse screening when available.  Psychiatry consult when extubated. Weaning, start T F, NO extubation today able  I have personally obtained a history, examined the patient, evaluated laboratory and imaging results, formulated the assessment and plan and placed orders.  CRITICAL CARE:  The patient is critically ill with multiple organ systems failure and requires high complexity decision making for assessment and support, frequent evaluation and titration of therapies, application of advanced monitoring technologies and extensive interpretation of multiple databases. Critical Care Time devoted to patient care services described in this note is 45 minutes.   Mcarthur Rossetti. Tyson Alias, MD, FACP Pgr: [phone] [facility]

## 2012-07-05 NOTE — Progress Notes (Signed)
eLink Physician-Brief Progress Note Patient Name: Jimmy Marshall DOB: [birth year] MRN: [id number]  Date of Service  07/05/2012   HPI/Events of Note  Awake & pulling good Tv on PS 5/5   eICU Interventions  Extubate   Intervention Category Major Interventions: Respiratory failure - evaluation and management  Aubry Rankin V. 07/05/2012, 8:16 PM

## 2012-07-05 NOTE — ED Notes (Signed)
Spoke with Angelique Blonder from poison control.  She stated to expect tachycardia and sleepiness.  And monitor cardiac (obtain EKG), and temperature.  She suggest obtaining a 4 hour post Tylenol and Salicylate level.  At this point he is out of the time frame for activated charcoal.  Provide supportive care.

## 2012-07-05 NOTE — Progress Notes (Signed)
INITIAL NUTRITION ASSESSMENT  DOCUMENTATION CODES Per approved criteria  -Not Applicable   INTERVENTION:  Initiate TF via OGT with Osmolite 1.2 at 25 ml/h, increase by 10 ml every 4 hours to goal rate of 65 ml/h with Prostat 30 ml BID to provide 2072 kcals, 117 gm protein, 1279 ml free water daily.  NUTRITION DIAGNOSIS: Inadequate oral intake related to inability to eat as evidenced by NPO status.   Goal: Intake to meet >90% of estimated nutrition needs.  Monitor:  TF tolerance/adequacy, weight trend, labs, vent status.  Reason for Assessment: MD Consult for TF initiation and management.  57 y.o. male  Admitting Dx: Acute encephalopathy  ASSESSMENT: Patient intentionally overdosed on Zyprexa, intubated on admission for airway protection.  RD received consult for TF initiation and management.  Propofol has been off since this morning per discussion with RN.    Patient is currently intubated on ventilator support.  MV: 9.3 Temp:Temp (24hrs), Avg:98.3 F (36.8 C), Min:95.9 F (35.5 C), Max:100.3 F (37.9 C)  Propofol off since this morning.  Height: Ht Readings from Last 1 Encounters:  07/05/12 6' (1.829 m)    Weight: Wt Readings from Last 1 Encounters:  07/05/12 183 lb 13.8 oz (83.4 kg)    Ideal Body Weight: 80.9 kg  % Ideal Body Weight: 103%  Wt Readings from Last 10 Encounters:  07/05/12 183 lb 13.8 oz (83.4 kg)  07/01/11 209 lb 14.1 oz (95.2 kg)  07/01/11 209 lb 14.1 oz (95.2 kg)  06/24/11 209 lb 14.1 oz (95.2 kg)  04/09/11 211 lb (95.709 kg)    Usual Body Weight: 209 lb  % Usual Body Weight: 88%; 12% weight loss in one year.  BMI:  Body mass index is 24.93 kg/(m^2).  Estimated Nutritional Needs: Kcal: 2053 Protein: 105-125 gm Fluid: 2-2.2 L  Skin: no problems noted  Diet Order: NPO  EDUCATION NEEDS: -Education not appropriate at this time   Intake/Output Summary (Last 24 hours) at 07/05/12 1324 Last data filed at 07/05/12 0800  Gross  per 24 hour  Intake 1438.83 ml  Output   1650 ml  Net -211.17 ml    Last BM: 3/31   Labs:   Recent Labs Lab 07/05/12 0405 07/05/12 0831  NA 144  --   K 3.0*  --   CL 105  --   CO2 26  --   BUN 23  --   CREATININE 1.22  --   CALCIUM 10.1  --   MG  --  2.5  PHOS  --  3.6  GLUCOSE 128*  --     CBG (last 3)   Recent Labs  07/05/12 0322 07/05/12 1255  GLUCAP 111* 99    Scheduled Meds: . antiseptic oral rinse  1 application Mouth Rinse QID  . chlorhexidine  15 mL Mouth/Throat BID  . heparin subcutaneous  5,000 Units Subcutaneous Q8H  . pantoprazole (PROTONIX) IV  40 mg Intravenous Q24H  . potassium chloride  40 mEq Per Tube Once  . rocuronium      . timolol  1 drop Both Eyes BID    Continuous Infusions: . sodium chloride 100 mL/hr at 07/05/12 0424  . feeding supplement (OSMOLITE 1.2 CAL)    . propofol 15 mcg/kg/min (07/05/12 0960)    Past Medical History  Diagnosis Date  . Hypertension   . Glaucoma(365)   . GERD (gastroesophageal reflux disease)   . Schizophrenia, schizo-affective   . Schizoaffective disorder, depressive type     "dx'd in  the 1990's"    Past Surgical History  Procedure Laterality Date  . Incision and drainage / excision thyroglossal cyst  06/30/11    w/central hyoid resection  . Fracture surgery  1996    jaw   . 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

## 2012-07-05 NOTE — ED Notes (Signed)
Pt's care taker: Audrie Lia [phone].  Any questions please call her.

## 2012-07-05 NOTE — ED Notes (Signed)
Pt is sleeping.  Able to be aroused  but falls back asleep.  Not able to answer any questions at this point.  Breathing is equal and symmetrical.

## 2012-07-05 NOTE — ED Notes (Signed)
Ems vitals: 160/100, Hr 100, r 20

## 2012-07-05 NOTE — ED Notes (Signed)
Patient report given to [facility] for admission, nurse voiced understanding of report and had no further questions. Patient is in stable condition at this time for transport. Patient will be going via stretcher, with monitor, vent, saline lock and fluids infusing. To room 2104

## 2012-07-05 NOTE — ED Provider Notes (Addendum)
History     CSN: [id number]  Arrival date & time 07/05/12  0147   First MD Initiated Contact with Patient 07/05/12 0159      Chief Complaint  Patient presents with  . Drug Overdose    (Consider location/radiation/quality/duration/timing/severity/associated sxs/prior treatment) The history is provided by the EMS personnel. The history is limited by the condition of the patient.  57 year old male brought in by EMS, following ingestion of 20-30 tabs 15 mg Zyprexa. Arrived very drowsy. Reported he stated he was going to die tonight. History of psychiatric disorders.   Level V Caveat applies due to patients drowsey state and altered mental status.   Past Medical History  Diagnosis Date  . Hypertension   . Glaucoma(365)   . GERD (gastroesophageal reflux disease)   . Schizophrenia, schizo-affective   . Schizoaffective disorder, depressive type     "dx'd in the 1990's"    Past Surgical History  Procedure Laterality Date  . Incision and drainage / excision thyroglossal cyst  06/30/11    w/central hyoid resection  . Fracture surgery  1996    jaw   . 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 No pertinent family history.  History  Substance Use Topics  . Smoking status: Never Smoker   . Smokeless tobacco: Never Used  . Alcohol Use: Yes     Comment: "stopped drinking 1999"      Review of Systems  Unable to perform ROS Unresponsive  Allergies  Review of patient's allergies indicates no known allergies.  Home Medications  No current outpatient prescriptions on file.  BP 155/112  Pulse 112  Temp(Src) 98.7 F (37.1 C) (Core (Comment))  Resp 23  Ht 6' (1.829 m)  Wt 183 lb 13.8 oz (83.4 kg)  BMI 24.93 kg/m2  SpO2 100%  Physical Exam  Nursing note and vitals reviewed. Constitutional: He appears  well-developed and well-nourished.  HENT:  Head: Normocephalic and atraumatic.  Mouth/Throat: Oropharynx is clear and moist.  No gag reflex. Not protecting airway.   Eyes:  Pupils pinpoint 2 mm not reactive  Neck: No tracheal deviation present.  Cardiovascular: Regular rhythm.   Tachycardic 140's  Pulmonary/Chest: Breath sounds normal. No stridor.  Effort not normal   Abdominal: Soft. Bowel sounds are normal.  Musculoskeletal: He exhibits no edema.  Lymphadenopathy:    He has no cervical adenopathy.  Neurological:  Unresponsive to painful stimuli. Questionable UE decorticate posturing.   Skin: Skin is warm. No rash noted.    ED Course  Procedures (including critical care time)  Patient was triaged as a level 3, although he arrived very drowsy and not responding to painful stimuli. I was notified of his state. Blood glucose had not been checked. When I walked in the room patient was tachycardic to 140's, not responsive, had abnormal breathing pattern, and no gag reflex. Intubation was required.   Labs Reviewed  GLUCOSE, CAPILLARY - Abnormal; Notable for the following:    Glucose-Capillary 111 (*)    All other components within normal limits  COMPREHENSIVE METABOLIC PANEL - Abnormal; Notable for the following:    Potassium 3.0 (*)    Glucose, Bld 128 (*)    GFR calc non Af Amer 66 (*)    GFR calc Af Amer 76 (*)    All other components within  normal limits  URINALYSIS, ROUTINE W REFLEX MICROSCOPIC - Abnormal; Notable for the following:    Ketones, ur 15 (*)    All other components within normal limits  SALICYLATE LEVEL - Abnormal; Notable for the following:    Salicylate Lvl <2.0 (*)    All other components within normal limits  CBC WITH DIFFERENTIAL - Abnormal; Notable for the following:    HCT 38.2 (*)    RDW 16.1 (*)    All other components within normal limits  POCT I-STAT 3, BLOOD GAS (G3+) - Abnormal; Notable for the following:    pCO2 arterial 45.7 (*)    pO2,  Arterial 135.0 (*)    Bicarbonate 26.2 (*)    All other components within normal limits  MRSA PCR SCREENING  LIPASE, BLOOD  PROTIME-INR  ETHANOL  URINE RAPID DRUG SCREEN (HOSP PERFORMED)  ACETAMINOPHEN LEVEL  BLOOD GAS, ARTERIAL  DRUGS OF ABUSE SCREEN W ALC, ROUTINE URINE  ACETAMINOPHEN LEVEL  SALICYLATE LEVEL   Dg Chest Port 1 View  07/05/2012  *RADIOLOGY REPORT*  Clinical Data: Post intubation.  Drug overdose.  PORTABLE CHEST - 1 VIEW  Comparison: 03/19/2011  Findings: An endotracheal tube has been placed with tip measures 2.4 cm above the carina.  Enteric tube tip is in the left upper quadrant consistent with location in the stomach.  The shallow inspiration with elevation of the right hemidiaphragm.  Linear atelectasis in the right and left lung base.  No focal consolidation or airspace disease.  No pneumothorax.  No blunting of costophrenic angles.  Tortuous and calcified aorta.  IMPRESSION: Appliances appear to be in satisfactory location.  Shallow inspiration with elevation of the right hemidiaphragm and linear atelectasis in the lung bases.   Original Report Authenticated By: Burman Nieves, M.D.     Date: 07/05/2012  Rate: 147  Rhythm: sinus tachycardia  QRS Axis: normal  Intervals: normal  ST/T Wave abnormalities: nonspecific T wave changes  Conduction Disutrbances:none  Narrative Interpretation:   Old EKG Reviewed: none available  Results for orders placed during the hospital encounter of 07/05/12  MRSA PCR SCREENING      Result Value Range   MRSA by PCR NEGATIVE  NEGATIVE  GLUCOSE, CAPILLARY      Result Value Range   Glucose-Capillary 111 (*) 70 - 99 mg/dL  COMPREHENSIVE METABOLIC PANEL      Result Value Range   Sodium 144  135 - 145 mEq/L   Potassium 3.0 (*) 3.5 - 5.1 mEq/L   Chloride 105  96 - 112 mEq/L   CO2 26  19 - 32 mEq/L   Glucose, Bld 128 (*) 70 - 99 mg/dL   BUN 23  6 - 23 mg/dL   Creatinine, Ser 4.54  0.50 - 1.35 mg/dL   Calcium 09.8  8.4 - 11.9 mg/dL    Total Protein 7.8  6.0 - 8.3 g/dL   Albumin 4.6  3.5 - 5.2 g/dL   AST 25  0 - 37 U/L   ALT 29  0 - 53 U/L   Alkaline Phosphatase 55  39 - 117 U/L   Total Bilirubin 0.4  0.3 - 1.2 mg/dL   GFR calc non Af Amer 66 (*) >90 mL/min   GFR calc Af Amer 76 (*) >90 mL/min  LIPASE, BLOOD      Result Value Range   Lipase 34  11 - 59 U/L  PROTIME-INR      Result Value Range   Prothrombin Time 13.3  11.6 -  15.2 seconds   INR 1.02  0.00 - 1.49  ETHANOL      Result Value Range   Alcohol, Ethyl (B) <11  0 - 11 mg/dL  URINE RAPID DRUG SCREEN (HOSP PERFORMED)      Result Value Range   Opiates NONE DETECTED  NONE DETECTED   Cocaine NONE DETECTED  NONE DETECTED   Benzodiazepines NONE DETECTED  NONE DETECTED   Amphetamines NONE DETECTED  NONE DETECTED   Tetrahydrocannabinol NONE DETECTED  NONE DETECTED   Barbiturates NONE DETECTED  NONE DETECTED  URINALYSIS, ROUTINE W REFLEX MICROSCOPIC      Result Value Range   Color, Urine YELLOW  YELLOW   APPearance CLEAR  CLEAR   Specific Gravity, Urine 1.015  1.005 - 1.030   pH 7.5  5.0 - 8.0   Glucose, UA NEGATIVE  NEGATIVE mg/dL   Hgb urine dipstick NEGATIVE  NEGATIVE   Bilirubin Urine NEGATIVE  NEGATIVE   Ketones, ur 15 (*) NEGATIVE mg/dL   Protein, ur NEGATIVE  NEGATIVE mg/dL   Urobilinogen, UA 0.2  0.0 - 1.0 mg/dL   Nitrite NEGATIVE  NEGATIVE   Leukocytes, UA NEGATIVE  NEGATIVE  ACETAMINOPHEN LEVEL      Result Value Range   Acetaminophen (Tylenol), Serum <15.0  10 - 30 ug/mL  SALICYLATE LEVEL      Result Value Range   Salicylate Lvl <2.0 (*) 2.8 - 20.0 mg/dL  CBC WITH DIFFERENTIAL      Result Value Range   WBC 5.7  4.0 - 10.5 K/uL   RBC 4.62  4.22 - 5.81 MIL/uL   Hemoglobin 13.1  13.0 - 17.0 g/dL   HCT 62.1 (*) 30.8 - 65.7 %   MCV 82.7  78.0 - 100.0 fL   MCH 28.4  26.0 - 34.0 pg   MCHC 34.3  30.0 - 36.0 g/dL   RDW 84.6 (*) 96.2 - 95.2 %   Platelets 214  150 - 400 K/uL   Neutrophils Relative 52  43 - 77 %   Neutro Abs 3.0  1.7 - 7.7  K/uL   Lymphocytes Relative 42  12 - 46 %   Lymphs Abs 2.4  0.7 - 4.0 K/uL   Monocytes Relative 6  3 - 12 %   Monocytes Absolute 0.3  0.1 - 1.0 K/uL   Eosinophils Relative 1  0 - 5 %   Eosinophils Absolute 0.0  0.0 - 0.7 K/uL   Basophils Relative 0  0 - 1 %   Basophils Absolute 0.0  0.0 - 0.1 K/uL  POCT I-STAT 3, BLOOD GAS (G3+)      Result Value Range   pH, Arterial 7.366  7.350 - 7.450   pCO2 arterial 45.7 (*) 35.0 - 45.0 mmHg   pO2, Arterial 135.0 (*) 80.0 - 100.0 mmHg   Bicarbonate 26.2 (*) 20.0 - 24.0 mEq/L   TCO2 28  0 - 100 mmol/L   O2 Saturation 99.0     Collection site RADIAL, ALLEN'S TEST ACCEPTABLE     Drawn by Operator     Sample type ARTERIAL       1. Overdose, initial encounter   2. Suicide attempt   3. Acute encephalopathy   4. Acute respiratory failure   5. Hypertension   6. Intentional Zyprexa overdose, initial encounter    INTUBATION Performed by: Shelda Jakes.  Required items: required blood products, implants, devices, and special equipment available Patient identity confirmed: provided demographic data and hospital-assigned identification number Time out: Immediately  prior to procedure a "time out" was called to verify the correct patient, procedure, equipment, support staff and site/side marked as required.  Indications: Unresponsive no gag reflex.   Intubation method: MAC 3 blade   Preoxygenation: 100% oxygen BVM  Sedatives: 20 mg Etomidate Paralytic: 100 mg Succinylcholine Also 100 mg of lidocaine   Tube Size: 7.5 cuffed  Post-procedure assessment: chest rise and ETCO2 monitor Breath sounds: equal and absent over the epigastrium Tube secured with: ETT holder Chest x-ray interpreted by radiologist and me.  Chest x-ray findings: endotracheal tube in appropriate position  Patient tolerated the procedure well with no immediate complications.     CRITICAL CARE Performed by: Shelda Jakes.   Total critical care time: 30 (does  not include intubation time)  Critical care time was exclusive of separately billable procedures and treating other patients.  Critical care was necessary to treat or prevent imminent or life-threatening deterioration.  Critical care was time spent personally by me on the following activities: development of treatment plan with patient and/or surrogate as well as nursing, discussions with consultants, evaluation of patient's response to treatment, examination of patient, obtaining history from patient or surrogate, ordering and performing treatments and interventions, ordering and review of laboratory studies, ordering and review of radiographic studies, pulse oximetry and re-evaluation of patient's condition.  MDM   Patient with reported overdose on Zyprexa. 15 mg tablet reportedly took 20-30 by mouth at bedtime as stated that he was going to kill himself. Brought in by EMS but very somnolent tachycardic. Quickly thereafter became completely unresponsive heart rate was in the 147 range. Patient moved to the trauma bay and intubated. Patient had no gag reflex at all. Following intubation patient workup the was continued and critical care team was called for a Mission. The tachycardia is most likely consistent with the Zyprexa overdose also did discuss the case with poison control. They said do expect significant somnolence and tachycardia. First blood gas was done after intubation. Prior to intubation did try the Narcan without any improvement and also did verify that his blood sugar was normal it was in the 100s.         Shelda Jakes, MD 07/05/12 1610  Shelda Jakes, MD 07/05/12 [phone]

## 2012-07-05 NOTE — Procedures (Signed)
Extubation Procedure Note  Patient Details:   Name: Jimmy Marshall DOB: [birth year] MRN: [id number]   Airway Documentation:     Evaluation  O2 sats: stable throughout Complications: No apparent complications Patient did tolerate procedure well. Bilateral Breath Sounds: Diminished Suctioning: Airway Yes  Vasiliki Smaldone, Thelma Barge 07/05/2012, 8:27 PM

## 2012-07-05 NOTE — ED Notes (Addendum)
Per EMS: about 0100 patient took 20 tablets of Zyprexa and ran to the park.  About 15 mins later he ran back home and EMS was called by a lady in the house.  Before pt took the Zyprexa he stated that "tonight we are going to die".  Both the pt's and lady's pupil were pin point.   Pt has a hx of schizophrenia.

## 2012-07-06 ENCOUNTER — Inpatient Hospital Stay (HOSPITAL_COMMUNITY): Payer: Medicaid Other

## 2012-07-06 DIAGNOSIS — R509 Fever, unspecified: Secondary | ICD-10-CM

## 2012-07-06 LAB — COMPREHENSIVE METABOLIC PANEL
ALT: 21 U/L (ref 0–53)
Alkaline Phosphatase: 55 U/L (ref 39–117)
CO2: 25 mEq/L (ref 19–32)
Calcium: 9.3 mg/dL (ref 8.4–10.5)
Chloride: 109 mEq/L (ref 96–112)
GFR calc Af Amer: >90 mL/min (ref >90)
GFR calc non Af Amer: >90 mL/min (ref >90)
Glucose, Bld: 97 mg/dL (ref 70–99)
Potassium: 3.7 mEq/L (ref 3.5–5.1)
Sodium: 145 mEq/L (ref 135–145)
Total Bilirubin: 0.8 mg/dL (ref 0.3–1.2)

## 2012-07-06 LAB — URINE DRUGS OF ABUSE SCREEN W ALC, ROUTINE (REF LAB)
Amphetamine Screen, Ur: NEGATIVE
Cocaine Metabolites: NEGATIVE
Opiate Screen, Urine: NEGATIVE
Phencyclidine (PCP): NEGATIVE
Propoxyphene: NEGATIVE

## 2012-07-06 LAB — CBC
HCT: 39.6 % (ref 39.0–52.0)
MCH: 28.3 pg (ref 26.0–34.0)
MCV: 84.3 fL (ref 78.0–100.0)
Platelets: 201 10*3/uL (ref 150–400)
RDW: 16.2 % — ABNORMAL HIGH (ref 11.5–15.5)

## 2012-07-06 MED ORDER — LABETALOL HCL 5 MG/ML IV SOLN
10.0000 mg | Freq: Once | INTRAVENOUS | Status: AC
  Administered 2012-07-06: 10 mg via INTRAVENOUS

## 2012-07-06 MED ORDER — POTASSIUM CHLORIDE 20 MEQ/15ML (10%) PO LIQD
20.0000 meq | Freq: Every day | ORAL | Status: DC
  Filled 2012-07-06: qty 15

## 2012-07-06 MED ORDER — ATENOLOL 25 MG PO TABS
25.0000 mg | ORAL_TABLET | Freq: Every day | ORAL | Status: DC
  Administered 2012-07-07: 25 mg via ORAL
  Filled 2012-07-06 (×2): qty 1

## 2012-07-06 MED ORDER — AMLODIPINE BESYLATE 2.5 MG PO TABS
2.5000 mg | ORAL_TABLET | Freq: Every day | ORAL | Status: DC
Start: 2012-07-06 — End: 2012-07-07
  Administered 2012-07-07: 2.5 mg via ORAL
  Filled 2012-07-06 (×2): qty 1

## 2012-07-06 NOTE — Consult Note (Signed)
Reason for Consult: suicidal attempt and history of schizophrenia Referring Physician: Dr. Buddy Duty is an 57 y.o. male.  HPI: patient was seen and chart reviewed. Patient was admitted to [facility] followed by intentional overdose of antipsychotic medication Zyprexa as a suicidal attempt. He has been diagnosed with schizoaffective disorder. Patient was required to be intubated and initially placed in ICU. Now he was extubated and able to move to step down unit for further observation and has sitter at bed side. Patient could not offer explanation for suicidal attempt and unable to express his stresses.   MSE: Patient was staying in his bed without distress. He was calm and cooperative. He has slurred speech and difficult to comprehend several words. He has been unhappy and anxious mood and affect. He has problems with expressing his thoughts and speech makes it difficult to complete the assessment. He continues to be suicidal but has denied homicidal ideations. He has no current symptoms of psychosis. He has poor insight, judgment and impulse control.  Past Medical History  Diagnosis Date  . Hypertension   . Glaucoma(365)   . GERD (gastroesophageal reflux disease)   . Schizophrenia, schizo-affective   . Schizoaffective disorder, depressive type     "dx'd in the 1990's"    Past Surgical History  Procedure Laterality Date  . Incision and drainage / excision thyroglossal cyst  06/30/11    w/central hyoid resection  . Fracture surgery  1996    jaw   . 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 No pertinent family history.  Social History:  reports that he has never smoked. He has never used smokeless tobacco. He reports that  drinks alcohol. He reports that he uses illicit drugs (Marijuana and  Cocaine).  Allergies: No Known Allergies  Medications: I have reviewed the patient's current medications.  Results for orders placed during the hospital encounter of 07/05/12 (from the past 48 hour(s))  GLUCOSE, CAPILLARY     Status: Abnormal   Collection Time    07/05/12  3:22 AM      Result Value Range   Glucose-Capillary 111 (*) 70 - 99 mg/dL  URINALYSIS, ROUTINE W REFLEX MICROSCOPIC     Status: Abnormal   Collection Time    07/05/12  4:00 AM      Result Value Range   Color, Urine YELLOW  YELLOW   APPearance CLEAR  CLEAR   Specific Gravity, Urine 1.015  1.005 - 1.030   pH 7.5  5.0 - 8.0   Glucose, UA NEGATIVE  NEGATIVE mg/dL   Hgb urine dipstick NEGATIVE  NEGATIVE   Bilirubin Urine NEGATIVE  NEGATIVE   Ketones, ur 15 (*) NEGATIVE mg/dL   Protein, ur NEGATIVE  NEGATIVE mg/dL   Urobilinogen, UA 0.2  0.0 - 1.0 mg/dL   Nitrite NEGATIVE  NEGATIVE   Leukocytes, UA NEGATIVE  NEGATIVE   Comment: MICROSCOPIC NOT DONE ON URINES WITH NEGATIVE PROTEIN, BLOOD, LEUKOCYTES, NITRITE, OR GLUCOSE <1000 mg/dL.  COMPREHENSIVE METABOLIC PANEL     Status: Abnormal   Collection Time    07/05/12  4:05 AM      Result Value Range   Sodium 144  135 - 145 mEq/L   Potassium 3.0 (*) 3.5 - 5.1 mEq/L   Chloride 105  96 - 112 mEq/L  CO2 26  19 - 32 mEq/L   Glucose, Bld 128 (*) 70 - 99 mg/dL   BUN 23  6 - 23 mg/dL   Creatinine, Ser 1.61  0.50 - 1.35 mg/dL   Calcium 09.6  8.4 - 04.5 mg/dL   Total Protein 7.8  6.0 - 8.3 g/dL   Albumin 4.6  3.5 - 5.2 g/dL   AST 25  0 - 37 U/L   ALT 29  0 - 53 U/L   Alkaline Phosphatase 55  39 - 117 U/L   Total Bilirubin 0.4  0.3 - 1.2 mg/dL   GFR calc non Af Amer 66 (*) >90 mL/min   GFR calc Af Amer 76 (*) >90 mL/min   Comment:            The eGFR has been calculated     using the CKD EPI equation.     This calculation has not been     validated in all clinical     situations.     eGFR's persistently     <90 mL/min signify     possible Chronic Kidney Disease.   LIPASE, BLOOD     Status: None   Collection Time    07/05/12  4:05 AM      Result Value Range   Lipase 34  11 - 59 U/L  PROTIME-INR     Status: None   Collection Time    07/05/12  4:05 AM      Result Value Range   Prothrombin Time 13.3  11.6 - 15.2 seconds   INR 1.02  0.00 - 1.49  ETHANOL     Status: None   Collection Time    07/05/12  4:05 AM      Result Value Range   Alcohol, Ethyl (B) <11  0 - 11 mg/dL   Comment:            LOWEST DETECTABLE LIMIT FOR     SERUM ALCOHOL IS 11 mg/dL     FOR MEDICAL PURPOSES ONLY  URINE RAPID DRUG SCREEN (HOSP PERFORMED)     Status: None   Collection Time    07/05/12  4:05 AM      Result Value Range   Opiates NONE DETECTED  NONE DETECTED   Cocaine NONE DETECTED  NONE DETECTED   Benzodiazepines NONE DETECTED  NONE DETECTED   Amphetamines NONE DETECTED  NONE DETECTED   Tetrahydrocannabinol NONE DETECTED  NONE DETECTED   Barbiturates NONE DETECTED  NONE DETECTED   Comment:            DRUG SCREEN FOR MEDICAL PURPOSES     ONLY.  IF CONFIRMATION IS NEEDED     FOR ANY PURPOSE, NOTIFY LAB     WITHIN 5 DAYS.                LOWEST DETECTABLE LIMITS     FOR URINE DRUG SCREEN     Drug Class       Cutoff (ng/mL)     Amphetamine      1000     Barbiturate      200     Benzodiazepine   200     Tricyclics       300     Opiates          300     Cocaine          300     THC  50  ACETAMINOPHEN LEVEL     Status: None   Collection Time    07/05/12  4:05 AM      Result Value Range   Acetaminophen (Tylenol), Serum <15.0  10 - 30 ug/mL   Comment:            THERAPEUTIC CONCENTRATIONS VARY     SIGNIFICANTLY. A RANGE OF 10-30     ug/mL MAY BE AN EFFECTIVE     CONCENTRATION FOR MANY PATIENTS.     HOWEVER, SOME ARE BEST TREATED     AT CONCENTRATIONS OUTSIDE THIS     RANGE.     ACETAMINOPHEN CONCENTRATIONS     >150 ug/mL AT 4 HOURS AFTER     INGESTION AND >50 ug/mL AT 12     HOURS AFTER INGESTION ARE     OFTEN ASSOCIATED WITH TOXIC      REACTIONS.  SALICYLATE LEVEL     Status: Abnormal   Collection Time    07/05/12  4:05 AM      Result Value Range   Salicylate Lvl <2.0 (*) 2.8 - 20.0 mg/dL  CBC WITH DIFFERENTIAL     Status: Abnormal   Collection Time    07/05/12  4:43 AM      Result Value Range   WBC 5.7  4.0 - 10.5 K/uL   RBC 4.62  4.22 - 5.81 MIL/uL   Hemoglobin 13.1  13.0 - 17.0 g/dL   HCT 91.4 (*) 78.2 - 95.6 %   MCV 82.7  78.0 - 100.0 fL   MCH 28.4  26.0 - 34.0 pg   MCHC 34.3  30.0 - 36.0 g/dL   RDW 21.3 (*) 08.6 - 57.8 %   Platelets 214  150 - 400 K/uL   Neutrophils Relative 52  43 - 77 %   Neutro Abs 3.0  1.7 - 7.7 K/uL   Lymphocytes Relative 42  12 - 46 %   Lymphs Abs 2.4  0.7 - 4.0 K/uL   Monocytes Relative 6  3 - 12 %   Monocytes Absolute 0.3  0.1 - 1.0 K/uL   Eosinophils Relative 1  0 - 5 %   Eosinophils Absolute 0.0  0.0 - 0.7 K/uL   Basophils Relative 0  0 - 1 %   Basophils Absolute 0.0  0.0 - 0.1 K/uL  POCT I-STAT 3, BLOOD GAS (G3+)     Status: Abnormal   Collection Time    07/05/12  4:47 AM      Result Value Range   pH, Arterial 7.366  7.350 - 7.450   pCO2 arterial 45.7 (*) 35.0 - 45.0 mmHg   pO2, Arterial 135.0 (*) 80.0 - 100.0 mmHg   Bicarbonate 26.2 (*) 20.0 - 24.0 mEq/L   TCO2 28  0 - 100 mmol/L   O2 Saturation 99.0     Collection site RADIAL, ALLEN'S TEST ACCEPTABLE     Drawn by Operator     Sample type ARTERIAL    MRSA PCR SCREENING     Status: None   Collection Time    07/05/12  5:30 AM      Result Value Range   MRSA by PCR NEGATIVE  NEGATIVE   Comment:            The GeneXpert MRSA Assay (FDA     approved for NASAL specimens     only), is one component of a     comprehensive MRSA colonization     surveillance program. It is  not     intended to diagnose MRSA     infection nor to guide or     monitor treatment for     MRSA infections.  GLUCOSE, CAPILLARY     Status: Abnormal   Collection Time    07/05/12  8:06 AM      Result Value Range   Glucose-Capillary 109 (*) 70  - 99 mg/dL  ACETAMINOPHEN LEVEL     Status: None   Collection Time    07/05/12  8:31 AM      Result Value Range   Acetaminophen (Tylenol), Serum <15.0  10 - 30 ug/mL   Comment:            THERAPEUTIC CONCENTRATIONS VARY     SIGNIFICANTLY. A RANGE OF 10-30     ug/mL MAY BE AN EFFECTIVE     CONCENTRATION FOR MANY PATIENTS.     HOWEVER, SOME ARE BEST TREATED     AT CONCENTRATIONS OUTSIDE THIS     RANGE.     ACETAMINOPHEN CONCENTRATIONS     >150 ug/mL AT 4 HOURS AFTER     INGESTION AND >50 ug/mL AT 12     HOURS AFTER INGESTION ARE     OFTEN ASSOCIATED WITH TOXIC     REACTIONS.  SALICYLATE LEVEL     Status: Abnormal   Collection Time    07/05/12  8:31 AM      Result Value Range   Salicylate Lvl <2.0 (*) 2.8 - 20.0 mg/dL  MAGNESIUM     Status: None   Collection Time    07/05/12  8:31 AM      Result Value Range   Magnesium 2.5  1.5 - 2.5 mg/dL  PHOSPHORUS     Status: None   Collection Time    07/05/12  8:31 AM      Result Value Range   Phosphorus 3.6  2.3 - 4.6 mg/dL  GLUCOSE, CAPILLARY     Status: None   Collection Time    07/05/12 12:55 PM      Result Value Range   Glucose-Capillary 99  70 - 99 mg/dL   Comment 1 Notify RN    DRUGS OF ABUSE SCREEN W ALC, ROUTINE URINE     Status: Abnormal   Collection Time    07/05/12  1:02 PM      Result Value Range   Amphetamine Screen, Ur NEGATIVE  Negative   Marijuana Metabolite NEGATIVE  Negative   Barbiturate Quant, Ur NEGATIVE  Negative   Methadone NEGATIVE  Negative   Propoxyphene NEGATIVE  Negative   Benzodiazepines. POSITIVE (*) Negative   Comment: (NOTE)     Result repeated and verified.     Sent for confirmatory testing   Phencyclidine (PCP) NEGATIVE  Negative   Cocaine Metabolites NEGATIVE  Negative   Opiate Screen, Urine NEGATIVE  Negative   Ethyl Alcohol <10  <10 mg/dL   Creatinine,U 161.0     Comment: (NOTE)     Cutoff Values for Urine Drug Screen:            Drug Class           Cutoff (ng/mL)             Amphetamines            1000            Barbiturates             200  Cocaine Metabolites      300            Benzodiazepines          200            Methadone                300            Opiates                 2000            Phencyclidine             25            Propoxyphene             300            Marijuana Metabolites     50     For medical purposes only.  GLUCOSE, CAPILLARY     Status: Abnormal   Collection Time    07/05/12  4:09 PM      Result Value Range   Glucose-Capillary 103 (*) 70 - 99 mg/dL   Comment 1 Notify RN    BASIC METABOLIC PANEL     Status: Abnormal   Collection Time    07/05/12  5:22 PM      Result Value Range   Sodium 143  135 - 145 mEq/L   Potassium 4.2  3.5 - 5.1 mEq/L   Chloride 109  96 - 112 mEq/L   CO2 24  19 - 32 mEq/L   Glucose, Bld 111 (*) 70 - 99 mg/dL   BUN 15  6 - 23 mg/dL   Creatinine, Ser 1.61  0.50 - 1.35 mg/dL   Calcium 9.2  8.4 - 09.6 mg/dL   GFR calc non Af Amer >90  >90 mL/min   GFR calc Af Amer >90  >90 mL/min   Comment:            The eGFR has been calculated     using the CKD EPI equation.     This calculation has not been     validated in all clinical     situations.     eGFR's persistently     <90 mL/min signify     possible Chronic Kidney Disease.  GLUCOSE, CAPILLARY     Status: Abnormal   Collection Time    07/05/12  8:46 PM      Result Value Range   Glucose-Capillary 107 (*) 70 - 99 mg/dL  COMPREHENSIVE METABOLIC PANEL     Status: None   Collection Time    07/06/12  4:20 AM      Result Value Range   Sodium 145  135 - 145 mEq/L   Potassium 3.7  3.5 - 5.1 mEq/L   Chloride 109  96 - 112 mEq/L   CO2 25  19 - 32 mEq/L   Glucose, Bld 97  70 - 99 mg/dL   BUN 13  6 - 23 mg/dL   Creatinine, Ser 0.45  0.50 - 1.35 mg/dL   Calcium 9.3  8.4 - 40.9 mg/dL   Total Protein 7.1  6.0 - 8.3 g/dL   Albumin 3.7  3.5 - 5.2 g/dL   AST 16  0 - 37 U/L   ALT 21  0 - 53 U/L   Alkaline Phosphatase 55  39 - 117 U/L    Total Bilirubin 0.8  0.3 - 1.2  mg/dL   GFR calc non Af Amer >90  >90 mL/min   GFR calc Af Amer >90  >90 mL/min   Comment:            The eGFR has been calculated     using the CKD EPI equation.     This calculation has not been     validated in all clinical     situations.     eGFR's persistently     <90 mL/min signify     possible Chronic Kidney Disease.  CBC     Status: Abnormal   Collection Time    07/06/12  7:55 AM      Result Value Range   WBC 8.8  4.0 - 10.5 K/uL   RBC 4.70  4.22 - 5.81 MIL/uL   Hemoglobin 13.3  13.0 - 17.0 g/dL   HCT 16.1  09.6 - 04.5 %   MCV 84.3  78.0 - 100.0 fL   MCH 28.3  26.0 - 34.0 pg   MCHC 33.6  30.0 - 36.0 g/dL   RDW 40.9 (*) 81.1 - 91.4 %   Platelets 201  150 - 400 K/uL    Dg Chest Port 1 View  07/06/2012  *RADIOLOGY REPORT*  Clinical Data: Cough.  Rule out aspiration  PORTABLE CHEST - 1 VIEW  Comparison: 07/05/2012  Findings: Endotracheal tube has been removed.  NG has been removed. There is bibasilar atelectasis.  Negative for pulmonary edema or effusion.  IMPRESSION: Mild bibasilar atelectasis, slightly increased following extubation.   Original Report Authenticated By: Janeece Riggers, M.D.    Dg Chest Port 1 View  07/05/2012  *RADIOLOGY REPORT*  Clinical Data: Post intubation.  Drug overdose.  PORTABLE CHEST - 1 VIEW  Comparison: 03/19/2011  Findings: An endotracheal tube has been placed with tip measures 2.4 cm above the carina.  Enteric tube tip is in the left upper quadrant consistent with location in the stomach.  The shallow inspiration with elevation of the right hemidiaphragm.  Linear atelectasis in the right and left lung base.  No focal consolidation or airspace disease.  No pneumothorax.  No blunting of costophrenic angles.  Tortuous and calcified aorta.  IMPRESSION: Appliances appear to be in satisfactory location.  Shallow inspiration with elevation of the right hemidiaphragm and linear atelectasis in the lung bases.   Original Report  Authenticated By: Burman Nieves, M.D.     Positive for anxiety, bad mood, bipolar, mood swings, sleep disturbance and Schizophrenia, chronic Blood pressure 124/88, pulse 85, temperature 101.6 F (38.7 C), temperature source Oral, resp. rate 14, height 6' (1.829 m), weight 183 lb 13.8 oz (83.4 kg), SpO2 96.00%.   Assessment/Plan: S/P Overdose as suicidal attempt Schizophrenia, paranoid, chronic  Recommendation: Acute psychiatric hospitalization for crisis stabilization, safe and secure therapeutic milieu and medication management. Patient is psychiatrically not stable to be discharged. Please contact [facility] when patient is medically stable. May contact social service for transition to [facility] if needed.  Elinora Weigand,JANARDHAHA R. 07/06/2012, 2:06 PM

## 2012-07-06 NOTE — Progress Notes (Signed)
SLP reviewed and agree with student findings.   Shaianne Nucci MA, CCC-SLP [phone]    

## 2012-07-06 NOTE — Progress Notes (Signed)
Patient alert and pulling at ETT. Patient able to follow commands and has been weaning all day with sedation off. MD Alva camera in the room to assess. Patient extubated by RT to [facility]. Will closely monitor patients resp status.   Patient placed on suicide precautions with suicide sitter at bedside.   Doree Albee

## 2012-07-06 NOTE — Progress Notes (Signed)
PULMONARY  / CRITICAL CARE MEDICINE  Name: Jimmy Marshall MRN: [id number] DOB: [birth year]    ADMISSION DATE:  07/05/2012 CONSULTATION DATE:  07/05/2012  REFERRING MD :  EDP PRIMARY SERVICE:  PCCM  CHIEF COMPLAINT:  Acute encephalopathy  BRIEF PATIENT DESCRIPTION: 57 yo with history of schizophrenia who took 20 tabs of Zyprexa after stating "tonight we are going to die".  Brought to [facility] ED and intubated for airway protection.  SIGNIFICANT EVENTS / STUDIES:  4/1  Intentional overdose of Zyprexa, intubated for airway protection 4/2  Extubated  LINES / TUBES: OETT 4/1 >>>4/2 OGT 4/1 >>>4/2 Foley 4/1 >>>4/2  CULTURES: None at this time  ANTIBIOTICS: None at this time  VITAL SIGNS: Temp:  [98.1 F (36.7 C)-100.6 F (38.1 C)] 99 F (37.2 C) (04/02 0600) Pulse Rate:  [66-116] 77 (04/02 0600) Resp:  [5-23] 15 (04/02 0600) BP: (126-155)/(91-113) 134/96 mmHg (04/02 0600) SpO2:  [95 %-100 %] 100 % (04/02 0600) FiO2 (%):  [40 %] 40 % (04/01 1947)  HEMODYNAMICS:   VENTILATOR SETTINGS: Vent Mode:  [-] PSV FiO2 (%):  [40 %] 40 % PEEP:  [5 cmH20] 5 cmH20 Pressure Support:  [5 cmH20-8 cmH20] 8 cmH20 INTAKE / OUTPUT: Intake/Output     04/01 0701 - 04/02 0700 04/02 0701 - 04/03 0700   I.V. (mL/kg) 1707.5 (20.5)    NG/GT     Total Intake(mL/kg) 1707.5 (20.5)    Urine (mL/kg/hr) 3150 (1.6)    Total Output 3150     Net -1442.5            PHYSICAL EXAMINATION: General:  Alert, hoarse, asks questions, somnolent Neuro:  Follows commands, asks questions, moves 4 extremities on command HEENT:  PERRL,  Cardiovascular:  RRR, no m/r/g Lungs: Shallow breaths, poor air movement, no respiratory distress, no w/r/r Abdomen:  Soft, nontender, bowel sounds diminished Musculoskeletal: Trace pretibial edema Skin:  Intact  LABS:  Recent Labs Lab 07/05/12 0405 07/05/12 0443 07/05/12 0447 07/05/12 0831 07/05/12 1722 07/06/12 0420  HGB  --  13.1  --   --   --   --   WBC  --  5.7  --    --   --   --   PLT  --  214  --   --   --   --   NA 144  --   --   --  143 145  K 3.0*  --   --   --  4.2 3.7  CL 105  --   --   --  109 109  CO2 26  --   --   --  24 25  GLUCOSE 128*  --   --   --  111* 97  BUN 23  --   --   --  15 13  CREATININE 1.22  --   --   --  0.97 0.96  CALCIUM 10.1  --   --   --  9.2 9.3  MG  --   --   --  2.5  --   --   PHOS  --   --   --  3.6  --   --   AST 25  --   --   --   --  16  ALT 29  --   --   --   --  21  ALKPHOS 55  --   --   --   --  55  BILITOT 0.4  --   --   --   --  0.8  PROT 7.8  --   --   --   --  7.1  ALBUMIN 4.6  --   --   --   --  3.7  INR 1.02  --   --   --   --   --   PHART  --   --  7.366  --   --   --   PCO2ART  --   --  45.7*  --   --   --   PO2ART  --   --  135.0*  --   --   --     Recent Labs Lab 07/05/12 0322 07/05/12 0806 07/05/12 1255 07/05/12 1609 07/05/12 2046  GLUCAP 111* 109* 99 103* 107*   CXR:  4/1 >>> Hardware in place, no overt airspace disease, improved 4/2  ASSESSMENT / PLAN:  PULMONARY A:  Acute respiratory failure in setting of drug overdose. Improved. Extubated. Saturating at 100% on 3L [place] Hoarseness, no stridor P:   -Wean off O2 as tolerated -IS -upright  CARDIOVASCULAR A: Hypertension. Improving. BP in 130-140s/90-100, HIGH risk for qtc prolongation EKG x3 with no QTc prolongation. Last QTc 423 K and Mg wnl P:  Goal SBP<150, DBP<110 Labetalol IV PRN Restart Norvasc, Atenolol Continue to hold HCTZ, Lisinopril Avoid drugs that prolong QTC ECG q8h for qtc, all WNL, dc further, remain on tele next 24 hrs Keep K , mag wnl  RENAL A:  Baseline Cr ~1.2. CKD stage 3. Cr this 0.96 this AM 1) Hypokalemia Resolved s/p K PO liq BID. K=3.7 2) Mg and Phos wnl P:   -NS@100  -Trend BMP -replace k   GASTROINTESTINAL A: Pt extubated 4/2 P:   NPO, started T F-->resume diet once more alert Protonix for GI Px LFT wnl May need slp (hoarseness, recent surgery neck, ett)  HEMATOLOGIC A:   DVT prevention P:  Heparin for DVT Px  INFECTIOUS A:  No active issues. P:   No intervention required pcxr in am for asp-pending  ENDOCRINE  A:  No active issues P:   No intervention required  NEUROLOGIC A: 1) Acute encephalopathy.  Zyprexa overdose.   2) acetaminophen neg and salicylate levels neg x 2(4AM&8AM),ethanol <11, UDS neg , drugs of abuse (urine) pending 3) Nl LFTs 4) RASS  P:   -Review drugs of abuse when available -Goal RASS 0 to -1 -Continue suicide precaution -Psychiatry consult   TODAY'S SUMMARY: 57 yo with Schizophrenia and intentional overdose of Zyprexa, intubated and now extubated. To sdu  I have personally obtained a history, examined the patient, evaluated laboratory and imaging results, formulated the assessment and plan and placed orders.  Mcarthur Rossetti. Tyson Alias, MD, FACP Pgr: [phone] [place]

## 2012-07-06 NOTE — Progress Notes (Signed)
SLP Cancellation Note  Patient Details Name: Jimmy Marshall MRN: [id number] DOB: July 25, 1955   Cancelled treatment:       Reason Eval/Treat Not Completed: Fatigue/lethargy limiting ability to participate. Pt responsive, but not adequate enough for participation of BSE despite several attempts from SLP (sternal rub, verbal commands). Will f/u at next date.  Berdine Dance SLP student  Berdine Dance 07/06/2012, 1:44 PM

## 2012-07-06 NOTE — Clinical Social Work Psychosocial (Signed)
     Clinical Social Work Department BRIEF PSYCHOSOCIAL ASSESSMENT 07/06/2012  Patient:  Jimmy Marshall, Jimmy Marshall     Account Number:  [id number]     Admit date:  07/05/2012  Clinical Social Worker:  Margaree Mackintosh  Date/Time:  07/06/2012 12:07 PM  Referred by:  Physician  Date Referred:  07/06/2012 Referred for  Psychosocial assessment   Other Referral:   Interview type:  Patient Other interview type:    PSYCHOSOCIAL DATA Living Status:  ALONE Admitted from facility:   Level of care:   Primary support name:  Audrie Lia: [phone] Primary support relationship to patient:  FRIEND Degree of support available:   Significant Other/Unknown.    CURRENT CONCERNS Current Concerns  Behavioral Health Issues   Other Concerns:    SOCIAL WORK ASSESSMENT / PLAN Clinical Social Worker recieved referral for "Behavioral Health Issues".  CSW reviewed chart and met with pt at bedside.  CSW introduced self, explained role, and provided support.  Pt appropriatley answered orientation questions to person, place, time, and event.  CSW provided active listening and support. Pt was able to answer direct questions but was difficult to understand, at times ,due to slurred speech.  CSW explained that a psych consult has been placed due to pt's Overdose PTA.  Pt stated understanding, sitter at bedside.  CSW to continue to follow and assist as needed.   Assessment/plan status:  Information/Referral to [facility] Other assessment/ plan:   Information/referral to community resources:   Psych Consult    PATIENTS/FAMILYS RESPONSE TO PLAN OF CARE: Pt requested to end session, no specific reason given.  Pt thanked CSW for intervention.

## 2012-07-07 ENCOUNTER — Inpatient Hospital Stay (HOSPITAL_COMMUNITY)
Admission: AD | Admit: 2012-07-07 | Discharge: 2012-07-11 | DRG: 885 | Disposition: A | Discharge status: Home / Self Care | Payer: Medicaid Other | Source: Intra-hospital | Attending: Psychiatry | Admitting: Psychiatry

## 2012-07-07 ENCOUNTER — Encounter (HOSPITAL_COMMUNITY): Payer: Self-pay | Admitting: *Deleted

## 2012-07-07 DIAGNOSIS — F200 Paranoid schizophrenia: Secondary | ICD-10-CM

## 2012-07-07 DIAGNOSIS — F209 Schizophrenia, unspecified: Principal | ICD-10-CM | POA: Diagnosis present

## 2012-07-07 DIAGNOSIS — Z79899 Other long term (current) drug therapy: Secondary | ICD-10-CM

## 2012-07-07 DIAGNOSIS — R509 Fever, unspecified: Secondary | ICD-10-CM | POA: Diagnosis present

## 2012-07-07 DIAGNOSIS — R45851 Suicidal ideations: Secondary | ICD-10-CM

## 2012-07-07 DIAGNOSIS — I10 Essential (primary) hypertension: Secondary | ICD-10-CM | POA: Diagnosis present

## 2012-07-07 DIAGNOSIS — F332 Major depressive disorder, recurrent severe without psychotic features: Secondary | ICD-10-CM | POA: Diagnosis present

## 2012-07-07 LAB — BENZODIAZEPINE, QUANTITATIVE, URINE
Alprazolam metabolite (GC/LC/MS), ur confirm: NEGATIVE ng/mL
Diazepam (GC/LC/MS), ur confirm: NEGATIVE ng/mL
Estazolam (GC/LC/MS), ur confirm: NEGATIVE ng/mL
Flurazepam GC/MS Conf: NEGATIVE ng/mL
Lorazepam (GC/LC/MS), ur confirm: NEGATIVE ng/mL
Midazolam (GC/LC/MS), ur confirm: >1000 ng/mL
Temazepam GC/MS Conf: NEGATIVE ng/mL
Triazolam metabolite (GC/LC/MS), ur confirm: NEGATIVE ng/mL

## 2012-07-07 LAB — BASIC METABOLIC PANEL
BUN: 21 mg/dL (ref 6–23)
CO2: 24 mEq/L (ref 19–32)
Calcium: 9 mg/dL (ref 8.4–10.5)
Chloride: 111 mEq/L (ref 96–112)
Creatinine, Ser: 1.25 mg/dL (ref 0.50–1.35)
Glucose, Bld: 88 mg/dL (ref 70–99)

## 2012-07-07 MED ORDER — MAGNESIUM HYDROXIDE 400 MG/5ML PO SUSP: 30.0000 mL | Freq: Every day | ORAL | Status: DC | PRN

## 2012-07-07 MED ORDER — PANTOPRAZOLE SODIUM 40 MG PO TBEC: 40.0000 mg | DELAYED_RELEASE_TABLET | Freq: Every day | ORAL | Status: DC

## 2012-07-07 MED ORDER — ACETAMINOPHEN 325 MG PO TABS: 650.0000 mg | ORAL_TABLET | Freq: Four times a day (QID) | ORAL | Status: DC | PRN

## 2012-07-07 MED ORDER — HYDROXYZINE HCL 50 MG PO TABS: 50.0000 mg | ORAL_TABLET | Freq: Every evening | ORAL | Status: DC | PRN

## 2012-07-07 MED ORDER — ALUM & MAG HYDROXIDE-SIMETH 200-200-20 MG/5ML PO SUSP: 30.0000 mL | ORAL | Status: DC | PRN

## 2012-07-07 NOTE — Plan of Care (Signed)
Patient is medically stable to discharge to behavioral health today.  Junious Silk, ANP

## 2012-07-07 NOTE — Progress Notes (Signed)
Clinical Child psychotherapist met with pt and pt's significant other-Francine- at bedside.  CSW provided active listening.  Pt spoke with a clear and coherent voice.  Pt maintained appropriate eye contact.  Pt provided permission for this CSW to speak openly with significant other and sitter present.  CSW reviewed HCPOA paperwork.  Pt currently requesting significant other to be his HCPOA.  CSW encouraged pt to review HCPOA paperwork and notify CSW should he have any additional questions/concerns.  CSW reviewed dc disposition-pt agreeable to dc to psych facility stating, "Yea, I need that after this". CSW to continue to follow and assist as needed.   Angelia Mould, MSW, [place] [phone]

## 2012-07-07 NOTE — Clinical Social Work Psych Note (Signed)
Pt has been accepted by [facility] to [place] to bed 306-1 at [facility].  Pt bed will not be ready until after 5pm.  Report # [phone].  RN and PA made aware.  Vickii Penna, LCSWA [phone]  Clinical Social Work

## 2012-07-07 NOTE — Tx Team (Signed)
Initial Interdisciplinary Treatment Plan  PATIENT STRENGTHS: (choose at least two) Ability for insight Average or above average intelligence Capable of independent living  PATIENT STRESSORS: Financial difficulties   PROBLEM LIST: Problem List/Patient Goals Date to be addressed Date deferred Reason deferred Estimated date of resolution  Depression 07/07/12     Suicidal thoughts 07/07/12                                                DISCHARGE CRITERIA:  Ability to meet basic life and health needs Improved stabilization in mood, thinking, and/or behavior Verbal commitment to aftercare and medication compliance  PRELIMINARY DISCHARGE PLAN: Attend aftercare/continuing care group Participate in family therapy Return to previous living arrangement  PATIENT/FAMIILY INVOLVEMENT: This treatment plan has been presented to and reviewed with the patient, Jimmy Marshall, and/or family member, .  The patient and family have been given the opportunity to ask questions and make suggestions.  Marijean Montanye, [place] 07/07/2012, 10:14 PM

## 2012-07-07 NOTE — Evaluation (Signed)
Clinical/Bedside Swallow Evaluation Patient Details  Name: Jimmy Marshall MRN: [id number] Date of Birth: Nov 01, 1955  Today's Date: 07/07/2012 Time: 4098-1191 SLP Time Calculation (min): 14 min  Past Medical History:  Past Medical History  Diagnosis Date  . Hypertension   . Glaucoma(365)   . GERD (gastroesophageal reflux disease)   . Schizophrenia, schizo-affective   . Schizoaffective disorder, depressive type     "dx'd in the 1990's"   Past Surgical History:  Past Surgical History  Procedure Laterality Date  . Incision and drainage / excision thyroglossal cyst  06/30/11    w/central hyoid resection  . Fracture surgery  1996    jaw   . 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  Brought to [facility] ED and intubated for airway protection. ALso on 3/26 pt underwent Excision of thyroglossal duct cyst with central hyoid resection by Dr. Annalee Genta. Apparently pt reported to ED due to prolonged hoarseness, mass was found posterior to the hyoid bone and adjacent to the superior aspect of the laryngeal cartilage with no reported evidence of malignancy after PET scan. ENT notes state pt was tolerating PO prior to d/c with no swelling.    Assessment / Plan / Recommendation Clinical Impression  Pt demonstrates normal oral and oropharyngeal function. No evidence of dysphagia or aspiration observed. Pt reports that vocal quality has been hoarse since before his laryngeal surgery when cyst was found and has not improved or worsened since that time. He denies any difficulty swallowing prior to admit. No SLP f/u needed unless pt desires outpatient voice therapy with SLP. Consider outpatient referral.     Aspiration Risk  Mild    Diet  Recommendation Regular;Thin liquid   Liquid Administration via: Cup;Straw Medication Administration: Whole meds with liquid Supervision: Patient able to self feed Postural Changes and/or Swallow Maneuvers: Seated upright 90 degrees    Other  Recommendations Oral Care Recommendations: Patient independent with oral care   Follow Up Recommendations  None    Frequency and Duration        Pertinent Vitals/Pain NA    SLP Swallow Goals     Swallow Study Prior Functional Status       General HPI: 57 yo with history of schizophrenia who took 20 tabs of Zyprexa after stating "tonight we are going to die".  Brought to [facility] ED and intubated for airway protection. ALso on 3/26 pt underwent Excision of thyroglossal duct cyst with central hyoid resection by Dr. Annalee Genta. Apparently pt reported to ED due to prolonged hoarseness, mass was found posterior to the hyoid bone and adjacent to the superior aspect of the laryngeal cartilage with no reported evidence of malignancy after PET scan. ENT notes state pt was tolerating PO prior to d/c with no swelling.  Type of Study: Bedside swallow evaluation Previous Swallow Assessment: none Diet Prior to this Study: NPO Temperature Spikes Noted: No Respiratory Status: Supplemental O2 delivered via (comment) History of Recent Intubation: Yes Length of Intubations (days): 2 days Date extubated: 07/06/12 Behavior/Cognition: Alert;Cooperative;Pleasant mood Oral Cavity - Dentition: Adequate natural dentition Self-Feeding Abilities: Able to feed self Patient Positioning: Upright in bed Baseline Vocal Quality: Hoarse Volitional Cough: Strong Volitional Swallow: Able to elicit    Oral/Motor/Sensory  Function Overall Oral Motor/Sensory Function: Appears within functional limits for tasks assessed   Ice Chips     Thin Liquid Thin Liquid: Within functional limits    Nectar Thick Nectar Thick Liquid: Not tested   Honey Thick Honey Thick Liquid: Not tested    Puree Puree: Within functional limits   Solid   GO    Solid: Within functional limits      [facility], MA CCC-SLP [phone]  Claudine Mouton 07/07/2012,10:01 AM

## 2012-07-07 NOTE — Clinical Social Work Psych Note (Signed)
Clinical Social Work Department CLINICAL SOCIAL WORK PSYCHIATRY SERVICE LINE ASSESSMENT 07/07/2012  Patient:  Jimmy Marshall  Account:  [id number]  Admit Date:  07/05/2012  Clinical Social Worker:  Read Drivers  Date/Time:  07/07/2012 02:07 PM Referred by:  RN  Date referred:  07/07/2012 Reason for Referral  Behavioral Health Issues   Presenting Symptoms/Problems (In the person's/family's own words):   "I overdosed"   Abuse/Neglect/Trauma History (check all that apply)  Denies history   Abuse/Neglect/Trauma Comments:   none reported or noted   Psychiatric History (check all that apply)  Inpatient/hospitilization   Psychiatric medications:  none reported   Current Mental Health Hospitalizations/Previous Mental Health History:   Pt states that he has been hospitalized once before "long time ago" for the same reasons (suicide attempt)   Current provider:   none reported or noted   Place and Date:   "long time ago"   Current Medications:   Current facility-administered medications:0.9 %  sodium chloride infusion, , Intravenous, Continuous, Shelda Jakes, MD, Last Rate: 100 mL/hr at 07/07/12 1102, 1,000 mL at 07/07/12 1102;  amLODipine (NORVASC) tablet 2.5 mg, 2.5 mg, Oral, Daily, Christen Bame, MD, 2.5 mg at 07/07/12 1021;  antiseptic oral rinse (BIOTENE) solution 15 mL, 1 application, Mouth Rinse, QID, Lonia Farber, MD, 15 mL at 07/07/12 1200  atenolol (TENORMIN) tablet 25 mg, 25 mg, Oral, Daily, Christen Bame, MD, 25 mg at 07/07/12 1021;  heparin injection 5,000 Units, 5,000 Units, Subcutaneous, Q8H, Lonia Farber, MD, 5,000 Units at 07/07/12 1339;  labetalol (NORMODYNE,TRANDATE) injection 20 mg, 20 mg, Intravenous, Q2H PRN, Lonia Farber, MD, 10 mg at 07/05/12 1716;  [START ON 07/08/2012] pantoprazole (PROTONIX) EC tablet 40 mg, 40 mg, Oral, Daily, Saima Rizwan, MD  timolol (TIMOPTIC) 0.5 % ophthalmic solution 1 drop, 1 drop, Both Eyes, BID,  Lonia Farber, MD, 1 drop at 07/07/12 1021   Previous Impatient Admission/Date/Reason:   "long time ago"- suicide attempt by OD   Emotional Health / Current Symptoms    Suicide/Self Harm  Suicide attempt in past (date/description)  Has a plan for suicide   Suicide attempt in the past:   pt stated he tried to OD once before "a long time ago". Pt unsure of exact year.   Other harmful behavior:   none reported or noted   Psychotic/Dissociative Symptoms  None reported   Other Psychotic/Dissociative Symptoms:   none reported or noted    Attention/Behavioral Symptoms  Inattentive  Withdrawn   Other Attention / Behavioral Symptoms:   Pt not forthcoming with answers to assessment.  Psych CSW had to reword questions several times before pt would answer them.  Pt was very short with answers and stated he did not want to talk to me.    Cognitive Impairment  Communication Disorder (Speech delay, Impediment, Impairment)  Orientation - Place  Orientation - Self  Orientation - Situation   Other Cognitive Impairment:   none reported or noted    Mood and Adjustment  DEPRESSION    Stress, Anxiety, Trauma, Any Recent Loss/Stressor  None reported   Anxiety (frequency):   none reported or noted   Phobia (specify):   none reported or noted   Compulsive behavior (specify):   none reported or noted   Obsessive behavior (specify):   none reported or noted   Other:   none reported or noted   Substance Abuse/Use  None   SBIRT completed (please refer for detailed history):  N  Self-reported substance use:  none reported or noted  UDS - undetected   Urinary Drug Screen Completed:  Y Alcohol level:   none upon admssion    Environmental/Housing/Living Arrangement  Stable housing   Who is in the home:   alone   Emergency contact:  Audrie Lia [phone].  Pt gave verbal permission for medical staff and Psych CSW to speak to [place] re: care    Financial  Medicaid   Patient's Strengths and Goals (patient's own words):   Pt has supportive family.   Clinical Social Worker's Interpretive Summary:   CSW met with pt at bedside.  Pt remained guarded throughout the assessment.  Pt stated that he was here at [facility] because of an OD.  Pt stated that he wanted to die.  Pt denies AHVD.  Pt denies HI.  Pt admits to current SI.  Pt remains unstable and in need of inpatient psychiatric tx.   Disposition:  Recommend Psych CSW continuing to support while in hospital   Vickii Penna, [place] [phone]  Clinical Social Work

## 2012-07-07 NOTE — Progress Notes (Signed)
57 year old male admitted on involuntary basis. Pt reports taking overdose of his medications and did so because he was having a stressful time and spoke about auditory hallucinations were bothering him at this time. He stated that "usually the medications work just fine but sometimes they don't and that's what happens". Pt reports that he goes to [facility] for medication management and also has a PCP that he sees on a regular basis. 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 Pt denies any SI on admission and is able to contract for safety on the unit. Pt does endorse auditory hallucinations and states they are always present but usually they are not bothersome. Pt was oriented to the unit and safety maintained.

## 2012-07-07 NOTE — Progress Notes (Signed)
Patient is to be transferred to [facility], Room 306-1.  Report given to [facility] and he stated  to discharge patient after 7:00 pm.  I informed him that Social Worker told me to discharge patient after 5:30pm.  But he stated that the  processing is done after 7:00.  I  Informed patient about this conversation and he verbalized understanding.    Patient has no clothes to wear, disposable clothes obtained from ED.  Ms. Cyndia Bent, patient's friend is made aware of this transfer.    Patient is alert and oriented and more calm and cooperative.

## 2012-07-07 NOTE — BH Assessment (Signed)
Assessment Note   Jimmy Marshall is an 57 y.o. male on medical unit at [facility] [place] and referred to [facility] by Dr. Elsie Saas. Pt was admitted to [facility] following an intentional overdose on antipsychotic medication Zyprexa as a suicide attempt. He has been dx with schizoaffective disorder. Patiet was required to be intubated and initially placed in ICU. Now he was extubated and able to move down to step down unit for further observation and has sitter at bed side. Pt could not offer explanation for suicidal attempt and unable to express his stresses. Patient was staying in his bed without distress. He was calm and cooperative. He has slurred speech and is difficult to comprehend several words. He has been unhappy and anxious mood and affect. He has problems expressing his thoughts and speech makes it difficult to complete the assessment. He continues to be suicidal but has denies homicidal ideations. He has no current symptoms of psychosis. He has poor insight, judgment, and impulse control.   Axis I: Schizophrenia, Paranoid Axis II: Deferred Axis III:  Past Medical History  Diagnosis Date  . Hypertension   . Glaucoma(365)   . GERD (gastroesophageal reflux disease)   . Schizophrenia, schizo-affective   . Schizoaffective disorder, depressive type     "dx'd in the 1990's"   Axis IV: other psychosocial or environmental problems and problems related to social environment Axis V: 21-30 behavior considerably influenced by delusions or hallucinations OR serious impairment in judgment, communication OR inability to function in almost all areas  Past Medical History:  Past Medical History  Diagnosis Date  . Hypertension   . Glaucoma(365)   . GERD (gastroesophageal reflux disease)   . Schizophrenia, schizo-affective   . Schizoaffective disorder, depressive type     "dx'd in the 1990's"    Past Surgical History  Procedure Laterality Date  . Incision and drainage / excision  thyroglossal cyst  06/30/11    w/central hyoid resection  . Fracture surgery  1996    jaw   . 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  Social History:  reports that he has never smoked. He has never used smokeless tobacco. He reports that  drinks alcohol. 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  Additional Social History:  Alcohol / Drug Use Pain Medications: see PTA meds list Prescriptions: see PTA meds list - pt attempted OD on Zyprexa Over the Counter: see PTA meds list History of alcohol / drug use?: No history of alcohol / drug abuse (pt denies)  CIWA:   COWS:    Allergies: No Known Allergies  Home Medications:  (Not in a hospital admission)  OB/GYN Status:  No LMP for male patient.  General Assessment Data Location of Assessment: [facility] Assessment Services Living Arrangements: Other (Comment) (caretaker Audrie Lia) Can pt return to current living arrangement?: Yes Transfer from: [facility] medical floor) Referral Source: Medical Floor Inpatient  Education Status Is patient currently in school?: No  Risk to self Suicidal Ideation: Yes-Currently Present Suicidal Intent: Yes-Currently Present Is patient at risk for suicide?: Yes Suicidal Plan?:  (pt attempted suicide by OD ) Access to Means: Yes Specify Access to Suicidal Means: prescribed meds What has been your use of drugs/alcohol within the last 12 months?: pt denies  Previous Attempts/Gestures: Yes How many times?: 1 (overdose sevearl yrs ago but couldn't remember year) Other Self Harm Risks: none Triggers for Past Attempts: None known Intentional Self Injurious Behavior: None Family Suicide History: Unknown Recent stressful life event(s):  (n/a) Persecutory voices/beliefs?:  No Depression: Yes Depression Symptoms: Despondent Substance abuse history and/or treatment for substance abuse?: No Suicide prevention information given to non-admitted patients: Not applicable  Risk to Others Homicidal Ideation: No Thoughts of Harm to Others: No Current Homicidal Intent: No Current Homicidal Plan: No Access to Homicidal Means: No Identified Victim: none History of harm to others?: No Assessment of Violence: None Noted Does patient have access to weapons?:  (unknown) Criminal Charges Pending?:  (unknown) Does patient have a court date:  (unknown)  Psychosis Hallucinations: None noted Delusions: None noted  Mental Status Report Appear/Hygiene: Other (Comment) (unremarkable) Eye Contact: Fair Motor Activity: Freedom of movement Speech: Logical/coherent Level of Consciousness: Quiet/awake Mood: Depressed;Anxious Affect: Anxious;Sad Anxiety Level: Moderate Thought Processes: Coherent;Relevant Judgement: Impaired Orientation: Person;Place;Time;Situation Obsessive Compulsive Thoughts/Behaviors: None  Cognitive Functioning Concentration: Normal Memory: Recent Intact;Remote Intact IQ: Average Insight: Poor Impulse Control: Poor Appetite: Fair Weight Loss: 0 Weight Gain: 0 Sleep:  (unknown) Total Hours of Sleep:  (unknown) Vegetative Symptoms: None  ADLScreening [facility] Assessment Services) Patient's cognitive ability adequate to safely complete daily activities?: Yes Patient able to express need for assistance with ADLs?: Yes Independently performs ADLs?: Yes (appropriate for developmental age)  Abuse/Neglect [facility]) Physical Abuse: Denies Verbal Abuse: Denies Sexual Abuse: Denies  Prior Inpatient Therapy Prior Inpatient Therapy: Yes Prior Therapy Dates: unknown date - several yrs ago Prior Therapy Facilty/Provider(s): unknown Reason for Treatment: suicide attempt by overdose  Prior Outpatient Therapy Prior Outpatient Therapy: No Prior Therapy  Dates: na Prior Therapy Facilty/Provider(s): na Reason for Treatment: na  ADL Screening (condition at time of admission) Patient's cognitive ability adequate to safely complete daily activities?: Yes Patient able to express need for assistance with ADLs?: Yes Independently performs ADLs?: Yes (appropriate for developmental age)       Abuse/Neglect Assessment (Assessment to be complete while patient is alone) Physical Abuse: Denies Verbal Abuse: Denies Sexual Abuse: Denies Exploitation of patient/patient's resources: Denies Self-Neglect: Denies     Merchant navy officer (For Healthcare) Advance Directive: Patient does not have advance directive    Additional Information 1:1 In Past 12 Months?: No CIRT Risk: No Elopement Risk: No Does patient have medical clearance?: Yes     Disposition:  Disposition Initial Assessment Completed for this Encounter: Yes Disposition of Patient: Inpatient treatment program (pt accepted to [facility] [place], [place])  On Site Evaluation by:   Reviewed with Physician:     Donnamarie Rossetti Marshall 07/07/2012 3:48 PM

## 2012-07-07 NOTE — Discharge Summary (Signed)
Physician Discharge Summary  Jimmy Marshall ZOX:[id number] DOB: July 30, 1955 DOA: 07/05/2012  PCP: Default, Provider, MD  Admit date: 07/05/2012 Discharge date: 07/07/2012  Time spent: 30 minutes  Recommendations for Outpatient Follow-up:  1. Follow up with your regular doctor about your blood pressure. We stopped the Prinizide this admission.  Discharge Diagnoses:  Active Problems:   Acute respiratory failure/inability to protect airway due to OD   Acute encephalopathy   Intentional Zyprexa overdose   Hypertension-controlled   Schizophrenia, schizoaffective   Fever, unspecified- presumed due to possible mild aspiration pneumonitis   GERD (gastroesophageal reflux disease)   Discharge Condition: stable  Diet recommendation: Regular  Filed Weights   07/05/12 0500 07/06/12 1515 07/06/12 2321  Weight: 83.4 kg (183 lb 13.8 oz) 85.8 kg (189 lb 2.5 oz) 86.3 kg (190 lb 4.1 oz)    History of present illness:  57 yo with history of schizophrenia who took 20 tabs of Zyprexa after stating "tonight we are going to die". Brought to [facility] ED and intubated for airway protection. Patient was eventually extubated in transfer to the step down unit. Team 1 assumed care of the patient on 07/07/2012.   Hospital Course:  Acute respiratory failure in setting of drug overdose. Because of acute encephalopathy from overdose patient required intubation for airway protection. Chest x-ray revealed no acute process in the lung. Patient was eventually extubated and transferred to the step down unit where he remained stable.  Intentional overdose on Zyprexa/schizophrenia Was admitted to the ICU due to high risk for QTC prolongation. EKGs were checked every 8 hours to follow QTC but these remained within normal limits. Regular monitoring of potassium and magnesium was done as well with recommendation to keep potassium greater than 4.0. Patient had no QTC prolongation. Because of his history of chronic paranoid type  schizophrenia psychiatry consultation was obtained this admission. Per their recommendations patient will require acute psychiatric hospitalization for crisis stabilization, safe and secure therapeutic milieu and medication management. They note patient from a psychiatric standpoint is not stable to be discharged home. Patient has been determined medically stable on 07/07/2012 and will be discharged to the psychiatric unit today if bed is available.  Hypertension Blood pressure well controlled during the hospitalization. Patient's Norvasc and Tenormin were resumed this admission post extubation. There was a report that the patient's Prinzide had been held prior to admission so this medication was not resumed.  Procedures:  None  Consultations:  Pulmonary critical care medicine  Psychiatry  Discharge Exam: Filed Vitals:   07/06/12 2321 07/07/12 0200 07/07/12 0341 07/07/12 0752  BP: 132/89 124/84 117/87 130/99  Pulse: 99 98 92 87  Temp: 100.5 F (38.1 C)  98.6 F (37 C) 98 F (36.7 C)  TempSrc: Axillary  Axillary Axillary  Resp: 23 13 17 15   Height:      Weight: 86.3 kg (190 lb 4.1 oz)     SpO2: 95% 95% 99% 98%   Exam: General: No acute respiratory distress Lungs: Clear to auscultation bilaterally without wheezes or crackles, RA Cardiovascular: Regular rate and rhythm without murmur gallop or rub normal S1 and S2, no peripheral edema or JVD Abdomen: Nontender, nondistended, soft, bowel sounds positive, no rebound, no ascites, no appreciable mass Musculoskeletal: No significant cyanosis, clubbing of bilateral lower extremities Neurological: Alert and oriented x 3, moves all extremities x 4 without focal neurological deficits, CN 2-13 intact   Discharge Instructions     Medication List    STOP taking these medications  lisinopril-hydrochlorothiazide 20-25 MG per tablet  Commonly known as:  PRINZIDE,ZESTORETIC      TAKE these medications       amLODipine 2.5 MG  tablet  Commonly known as:  NORVASC  Take 2.5 mg by mouth daily.     atenolol 25 MG tablet  Commonly known as:  TENORMIN  Take 25 mg by mouth daily.     OLANZapine 15 MG tablet  Commonly known as:  ZYPREXA  Take 30 mg by mouth at bedtime.     timolol 0.5 % ophthalmic solution  Commonly known as:  BETIMOL  Place 1 drop into both eyes 2 (two) times daily.       Follow-up Information   Follow up with Default, Provider, MD. (follow up regarding management of your high blood pressure 1-2 weeks after discharge from behavioral health)    Contact information:   [address] [place] [address] [phone]        The results of significant diagnostics from this hospitalization (including imaging, microbiology, ancillary and laboratory) are listed below for reference.    Significant Diagnostic Studies: Dg Chest Port 1 View  07/06/2012  *RADIOLOGY REPORT*  Clinical Data: Cough.  Rule out aspiration  PORTABLE CHEST - 1 VIEW  Comparison: 07/05/2012  Findings: Endotracheal tube has been removed.  NG has been removed. There is bibasilar atelectasis.  Negative for pulmonary edema or effusion.  IMPRESSION: Mild bibasilar atelectasis, slightly increased following extubation.   Original Report Authenticated By: Janeece Riggers, M.D.    Dg Chest Port 1 View  07/05/2012  *RADIOLOGY REPORT*  Clinical Data: Post intubation.  Drug overdose.  PORTABLE CHEST - 1 VIEW  Comparison: 03/19/2011  Findings: An endotracheal tube has been placed with tip measures 2.4 cm above the carina.  Enteric tube tip is in the left upper quadrant consistent with location in the stomach.  The shallow inspiration with elevation of the right hemidiaphragm.  Linear atelectasis in the right and left lung base.  No focal consolidation or airspace disease.  No pneumothorax.  No blunting of costophrenic angles.  Tortuous and calcified aorta.  IMPRESSION: Appliances appear to be in satisfactory location.  Shallow inspiration with  elevation of the right hemidiaphragm and linear atelectasis in the lung bases.   Original Report Authenticated By: Burman Nieves, M.D.     Microbiology: Recent Results (from the past 240 hour(s))  MRSA PCR SCREENING     Status: None   Collection Time    07/05/12  5:30 AM      Result Value Range Status   MRSA by PCR NEGATIVE  NEGATIVE Final   Comment:            The GeneXpert MRSA Assay (FDA     approved for NASAL specimens     only), is one component of a     comprehensive MRSA colonization     surveillance program. It is not     intended to diagnose MRSA     infection nor to guide or     monitor treatment for     MRSA infections.     Labs: Basic Metabolic Panel:  Recent Labs Lab 07/05/12 0405 07/05/12 0831 07/05/12 1722 07/06/12 0420 07/07/12 0525  NA 144  --  143 145 145  K 3.0*  --  4.2 3.7 3.8  CL 105  --  109 109 111  CO2 26  --  24 25 24   GLUCOSE 128*  --  111* 97 88  BUN  23  --  15 13 21   CREATININE 1.22  --  0.97 0.96 1.25  CALCIUM 10.1  --  9.2 9.3 9.0  MG  --  2.5  --   --   --   PHOS  --  3.6  --   --   --    Liver Function Tests:  Recent Labs Lab 07/05/12 0405 07/06/12 0420  AST 25 16  ALT 29 21  ALKPHOS 55 55  BILITOT 0.4 0.8  PROT 7.8 7.1  ALBUMIN 4.6 3.7    Recent Labs Lab 07/05/12 0405  LIPASE 34   No results found for this basename: AMMONIA,  in the last 168 hours CBC:  Recent Labs Lab 07/05/12 0443 07/06/12 0755  WBC 5.7 8.8  NEUTROABS 3.0  --   HGB 13.1 13.3  HCT 38.2* 39.6  MCV 82.7 84.3  PLT 214 201   Cardiac Enzymes: No results found for this basename: CKTOTAL, CKMB, CKMBINDEX, TROPONINI,  in the last 168 hours BNP: BNP (last 3 results) No results found for this basename: PROBNP,  in the last 8760 hours CBG:  Recent Labs Lab 07/05/12 0322 07/05/12 0806 07/05/12 1255 07/05/12 1609 07/05/12 2046  GLUCAP 111* 109* 99 103* 107*       Signed:  ELLIS,ALLISON L.ANP  [facility] 07/07/2012, 10:21  AM   I have examined the patient, reviewed the chart and modified the above note which I agree with.   Lynnelle Mesmer,MD [phone] 07/07/2012, 6:07 PM

## 2012-07-07 NOTE — Clinical Social Work Psych Note (Signed)
Psych CSW referred pt to [facility], [facility], [facility], [facility] and [facility].  The following places do not have beds available: -[facility], [facility], [facility], [place] [facility], [place], [place]  Psych CSW will continue to seek inpatient tx facility for pt.  Vickii Penna, LCSWA [phone]  Clinical Social Work

## 2012-07-07 NOTE — Progress Notes (Signed)
[facility] Progress Note [place] TEAM 1 - Stepdown/ICU TEAM   Jimmy Marshall OAC:[id number] DOB: October 22, 1955 DOA: 07/05/2012 PCP: Default, Provider, MD  Brief narrative: 57 yo with history of schizophrenia who took 20 tabs of Zyprexa after stating "tonight we are going to die". Brought to [facility] ED and intubated for airway protection. Patient was eventually extubated in transfer to the step down unit. Team 1 assumed care of the patient on 07/07/2012.   Assessment/Plan: Active Problems:   Acute respiratory failure/inability to protect airway 2/2 OD -stable- off O2 -had low grade fever likely 2/2 mild aspiration pneumonitis    Acute encephalopathy due to Intentional Zyprexa overdose  -resolved    Hypertension -controlled on Tenormin and Norvasc -PTA Prinizide had been dc'd preadmit by his PCP therefore we have elected NOT to resume here    Schizophrenia, schizoaffective/ Intentional Zyprexa overdose -Admit to ICU due to VDRF and concern over developing Qtc prolongation but EKG remained stable and K+/Mg was kept stable -Psych evaluated this pt and recommended inpt psych admit     GERD (gastroesophageal reflux disease)   DVT prophylaxis: Subcutaneous heparin Code Status: Full Family Communication: Patient Disposition Plan: Patient medically stable to discharge to behavioral health but unfortunately the behavioral health unit is a lot down due to Norovirus therefore we'll keep the patient in step down unit do to high risk of eloopement Isolation: None  Consultants: Psychiatry  Procedures: None  Antibiotics: None  HPI/Subjective: Patient alert and without any specific complaints. Affect seemed appropriate and he is not agitated at this time.   Objective: Blood pressure 139/87, pulse 73, temperature 97.8 F (36.6 C), temperature source Oral, resp. rate 15, height 5\' 11"  (1.803 m), weight 86.3 kg (190 lb 4.1 oz), SpO2 96.00%.  Intake/Output Summary (Last 24 hours)  at 07/07/12 1304 Last data filed at 07/07/12 0900  Gross per 24 hour  Intake   1900 ml  Output    700 ml  Net   1200 ml     Exam: General: No acute respiratory distress Lungs: Clear to auscultation bilaterally without wheezes or crackles, RA Cardiovascular: Regular rate and rhythm without murmur gallop or rub normal S1 and S2, no peripheral edema or JVD Abdomen: Nontender, nondistended, soft, bowel sounds positive, no rebound, no ascites, no appreciable mass Musculoskeletal: No significant cyanosis, clubbing of bilateral lower extremities Neurological: Alert and oriented x 3, moves all extremities x 4 without focal neurological deficits, CN 2-13 intact  Data Reviewed: Basic Metabolic Panel:  Recent Labs Lab 07/05/12 0405 07/05/12 0831 07/05/12 1722 07/06/12 0420 07/07/12 0525  NA 144  --  143 145 145  K 3.0*  --  4.2 3.7 3.8  CL 105  --  109 109 111  CO2 26  --  24 25 24   GLUCOSE 128*  --  111* 97 88  BUN 23  --  15 13 21   CREATININE 1.22  --  0.97 0.96 1.25  CALCIUM 10.1  --  9.2 9.3 9.0  MG  --  2.5  --   --   --   PHOS  --  3.6  --   --   --    Liver Function Tests:  Recent Labs Lab 07/05/12 0405 07/06/12 0420  AST 25 16  ALT 29 21  ALKPHOS 55 55  BILITOT 0.4 0.8  PROT 7.8 7.1  ALBUMIN 4.6 3.7    Recent Labs Lab 07/05/12 0405  LIPASE 34   No results found for this basename: AMMONIA,  in the last 168 hours CBC:  Recent Labs Lab 07/05/12 0443 07/06/12 0755  WBC 5.7 8.8  NEUTROABS 3.0  --   HGB 13.1 13.3  HCT 38.2* 39.6  MCV 82.7 84.3  PLT 214 201   Cardiac Enzymes: No results found for this basename: CKTOTAL, CKMB, CKMBINDEX, TROPONINI,  in the last 168 hours BNP (last 3 results) No results found for this basename: PROBNP,  in the last 8760 hours CBG:  Recent Labs Lab 07/05/12 0322 07/05/12 0806 07/05/12 1255 07/05/12 1609 07/05/12 2046  GLUCAP 111* 109* 99 103* 107*    Recent Results (from the past 240 hour(s))  MRSA PCR  SCREENING     Status: None   Collection Time    07/05/12  5:30 AM      Result Value Range Status   MRSA by PCR NEGATIVE  NEGATIVE Final   Comment:            The GeneXpert MRSA Assay (FDA     approved for NASAL specimens     only), is one component of a     comprehensive MRSA colonization     surveillance program. It is not     intended to diagnose MRSA     infection nor to guide or     monitor treatment for     MRSA infections.     Studies:  Recent x-ray studies have been reviewed in detail by the Attending Physician  Scheduled Meds:  Reviewed in detail by the Attending Physician   Junious Silk, ANP [facility] Office  [phone] Pager [phone]  On-Call/Text Page:      [email or web address]      password [email or web address]  If 7PM-7AM, please contact night-coverage [email or web address] Password [email or web address] 07/07/2012, 1:04 PM   LOS: 2 days   I have examined the patient, reviewed the chart and modified the above note which I agree with.   Casper Pagliuca,MD [phone] 07/07/2012, 6:01 PM

## 2012-07-08 ENCOUNTER — Encounter (HOSPITAL_COMMUNITY): Payer: Self-pay | Admitting: Psychiatry

## 2012-07-08 MED ORDER — TIMOLOL HEMIHYDRATE 0.5 % OP SOLN
1.0000 [drp] | Freq: Two times a day (BID) | OPHTHALMIC | Status: DC
  Administered 2012-07-08 – 2012-07-11 (×6): 1 [drp] via OPHTHALMIC
  Filled 2012-07-08 (×25): qty 0.1

## 2012-07-08 MED ORDER — OLANZAPINE 10 MG PO TABS
10.0000 mg | ORAL_TABLET | Freq: Every day | ORAL | Status: DC
  Administered 2012-07-08: 10 mg via ORAL
  Filled 2012-07-08 (×3): qty 1

## 2012-07-08 MED ORDER — AMLODIPINE BESYLATE 2.5 MG PO TABS
2.5000 mg | ORAL_TABLET | Freq: Every day | ORAL | Status: DC
  Administered 2012-07-08 – 2012-07-10 (×3): 2.5 mg via ORAL
  Filled 2012-07-08 (×5): qty 1

## 2012-07-08 MED ORDER — ATENOLOL 25 MG PO TABS
25.0000 mg | ORAL_TABLET | Freq: Every day | ORAL | Status: DC
  Administered 2012-07-08 – 2012-07-11 (×4): 25 mg via ORAL
  Filled 2012-07-08 (×5): qty 1

## 2012-07-08 NOTE — Progress Notes (Signed)
Took over Pt's care at 2330.  Pt is resting in bed with even and unlabored respirations.  Will continue to monitor.

## 2012-07-08 NOTE — [facility] Group Notes (Signed)
[facility] LCSW Aftercare Discharge Planning Group Note   07/08/2012 12:00 PM  Participation Quality:  Patient easily engaged in group.  Mood/Affect:  Appropriate, Depressed and Flat  Depression Rating:  Patient rated depression at four  Anxiety Rating:  Patient rating anxiety at four  Thoughts of Suicide:  No  Will you contract for safety?   Yes  Current AVH:  NoPlan for Discharge/Comments:  Patient to discharge home and will continue to follow with [facility] for medications.  He shared he has a male friend from [place], [place] who has been his support system but she can no longer afford to drive to [place].  Patient to be referred to a community support team (CST) for additional services.  Transportation Means:   Supports:  Patient has very limited support system and is fearful of losing that person due to financial restraints.  Jazmene Racz, Joesph July

## 2012-07-08 NOTE — Clinical Social Work Note (Signed)
Writer spoke with patient's friend in [place] who is patient's payee.  She shared she and patient went to school together and she has been looking after him.  She shared she had been giving patient money for his rent but he has not paid rent and is being evicted from his home.  She advised owner of home is putting the house up for sale.  Friend shared she is considering bringing patient to [facility] to stay with her for a while but is uncertain what she will do at this time.  She shared patient has a daughter and brother he live within thirty minutes of her.  She advised patient does not know his home is for sale.  She was informed we will advise patient of that situation.    Writer to wait on decision of friend regarding patient going to stay with her in [place] before making a referral for [employer] support.

## 2012-07-08 NOTE — [facility] Group Notes (Signed)
[facility] LCSW Group Therapy        Feelings Around Relapse        1:15-2:30 PM   07/08/2012 3:08 PM  Type of Therapy:  Group Therapy  Participation Level:  Active  Participation Quality:  Appropriate  Affect:  Appropriate  Cognitive:  Appropriate  Insight:  Engaged  Engagement in Therapy:  Engaged  Modes of Intervention:  Discussion, Education, Exploration, Problem-solving, Rapport Building and Socialization  Summary of Progress/Problems:  Patient reports relapse for him is not using the coping skills he knows he should put into place when he begins to have problems.  Patient shared he just wanted to "let go" and not use the skills that have benefited him in the past.  Wynn Banker 07/08/2012, 3:08 PM

## 2012-07-08 NOTE — Progress Notes (Signed)
Adult Psychoeducational Group Note  Date:  07/08/2012 Time:  10:00a Group Topic/Focus:  Early Warning Signs:   The focus of this group is to help patients identify signs or symptoms they exhibit before slipping into an unhealthy state or crisis.  Participation Level:  Active  Participation Quality:  Drowsy and Sharing  Affect:  Appropriate  Cognitive:  Appropriate  Insight: Good  Engagement in Group:  Engaged  Modes of Intervention:  Discussion  Additional Comments:  Patient do not have any substance issues but patient did state he has learn to cop with his schizophrenia disorder.  Casilda Carls 07/08/2012, 12:06 PM

## 2012-07-08 NOTE — [facility] Suicide Risk Assessment (Signed)
Suicide Risk Assessment  Admission Assessment     Nursing information obtained from:  Patient Demographic factors:  Male;Low socioeconomic status;Living alone;Unemployed Current Mental Status:  NA Loss Factors:  NA Historical Factors:  Prior suicide attempts;Family history of mental illness or substance abuse Risk Reduction Factors:  Positive social support  CLINICAL FACTORS:   Schizophrenia:   Depressive state  COGNITIVE FEATURES THAT CONTRIBUTE TO RISK:  Closed-mindedness Thought constriction (tunnel vision)    SUICIDE RISK:   Moderate:  Frequent suicidal ideation with limited intensity, and duration, some specificity in terms of plans, no associated intent, good self-control, limited dysphoria/symptomatology, some risk factors present, and identifiable protective factors, including available and accessible social support.  PLAN OF CARE: Supportive approach/coping skills/stress management                               Resume Zyprexa   I certify that inpatient services furnished can reasonably be expected to improve the patient's condition.  Nisaiah Bechtol A 07/08/2012, 4:31 PM

## 2012-07-08 NOTE — Progress Notes (Signed)
Jimmy Marshall is quiet, he is guarded and he makes poor eye contact. He stays in his room, mostly all day. HE takes his emds as ordered, he is concerned about his BP and he is relieved when he is started on his norvasc and atenolol.    A He completes his AM self inventory and on it he wrote he denied SI within the past 24 hrs, he rated his depression and hopelessness  "  / 3 " and states his DC plan is " work with a caregiver".    R Safety is in place and POC cont.

## 2012-07-08 NOTE — [facility] Suicide Risk Assessment (Signed)
[facility] INPATIENT:  Family/Significant Other Suicide Prevention Education  Suicide Prevention Education:  Education Completed; Jimmy Marshall, Friend, [phone];  has been identified by the patient as the family member/significant other with whom the patient will be residing, and identified as the person(s) who will aid the patient in the event of a mental health crisis (suicidal ideations/suicide attempt).  With written consent from the patient, the family member/significant other has been provided the following suicide prevention education, prior to the and/or following the discharge of the patient.  The suicide prevention education provided includes the following:  Suicide risk factors  Suicide prevention and interventions  National Suicide Hotline telephone number  [facility] assessment telephone number  [facility] Emergency Assistance 911  [facility] and/or Residential Mobile Crisis Unit telephone number  Request made of family/significant other to:  Remove weapons (e.g., guns, rifles, knives), all items previously/currently identified as safety concern.  Friend reports patient does not have guns.  Remove drugs/medications (over-the-counter, prescriptions, illicit drugs), all items previously/currently identified as a safety concern.  The family member/significant other verbalizes understanding of the suicide prevention education information provided.  The family member/significant other agrees to remove the items of safety concern listed above.  Wynn Banker 07/08/2012, 12:21 PM

## 2012-07-08 NOTE — Progress Notes (Signed)
Patient ID: Jimmy Marshall, male   DOB: [birth year], 57 y.o.   MRN: [id number] D: Pt. Lying in bed with eyes closed, resp. Even. A: Writer introduced self to client and asked him to report any concerns. R: Pt. No concerns, just wanted to sleep. Staff will continue to monitor q81min for safety.

## 2012-07-08 NOTE — H&P (Signed)
Psychiatric Admission Assessment Adult  Patient Identification:  Jimmy Marshall  Date of Evaluation:  07/08/2012  Chief Complaint:  Schizophrenia  History of Present Illness: This is a 57 year old African-American male. Admitted to [facility] from the [facility] ED with complaints of suicide attempt by overdose. Patient reports, I was taken to the [facility] on 07/05/12 because I attempted to kill myself. I took an overdose of Olanzapine that I was taking for my schizophrenia. The reason for my suicide attempt was because I felt hopeless in an instant. I have a lady that helped me with cooking, cleaning and taking my medicines. I live by myself. I always look forward to seeing her. But, she recently told me that she was not going to be coming to help me anymore. She said that the driving from [place] to [place] to help is getting to be too much for her. She worked for me last 07/04/12. I don't have anyone else to help me. To me, life was not worth living at that point. So, I decided that I'm going to end it. So, I took the pills. Olazapine works well for me. I would like to go back to taking it. I would not attempt to hurt myself no more. Besides, my only daughter told me to never try that again, I gave her my world. I'm feeling a lot better. I'm not depressed. The only thing is that when my schizophrenia is on, I hear voices, see stuff and smell stuff that does not make sence to me. But, this is not going on now. The last time that I heard voices was yesterday".  Elements:  Location:  [facility] adult unit. Quality:  "In an instant, I felt lonely and hopeless". Severity:  "I was severe at the time". Timing:  "This happened on 07/05/12". Duration:  "I have had schizophrenia x 30 years'. Context:  Auditory hallucinations, Visual hallucination, anxiety.  Associated Signs/Synptoms:  Depression Symptoms:  Denies any sign of depression at this time.  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  Psychiatric Specialty Exam: Physical Exam  Constitutional: He appears well-developed and well-nourished.  HENT:  Head: Normocephalic.  Eyes: Pupils are equal, round, and reactive to light. Left eye exhibits discharge.  Neck: Normal range of motion.  Cardiovascular: Normal rate.   Respiratory: Effort normal.  GI: Soft.  Musculoskeletal: Normal range of motion.  Neurological: He is alert.  Skin: Skin is warm and dry.  Psychiatric: His speech is normal. Thought content normal. His mood appears not anxious. His affect is not angry, not blunt, not labile and not inappropriate. He is actively hallucinating. Cognition and memory are normal. He expresses impulsivity. He does not exhibit a depressed mood.    Review of Systems  Constitutional: Negative.   HENT: Negative.   Eyes: Negative.   Respiratory: Negative.   Cardiovascular: Negative.        Hx Hypertension  Gastrointestinal: Negative.   Genitourinary: Negative.   Musculoskeletal: Negative.   Skin: Negative.   Neurological: Negative.   Endo/Heme/Allergies: Negative.   Psychiatric/Behavioral: Positive for hallucinations. Negative for depression, suicidal ideas, memory loss and substance abuse. The patient is not nervous/anxious and does not have insomnia.     Blood pressure 133/92, pulse 81, temperature 98.4 F (36.9 C), temperature source Oral, resp. rate 16, height 5\' 11"  (  1.803 m), weight 86.183 kg (190 lb).Body mass index is 26.51 kg/(m^2).  General Appearance: Disheveled  Eye Contact::  Good  Speech:  Clear and Coherent  Volume:  Normal  Mood:  Denies feeling or being depressed at this time.  Affect:  Constricted  Thought Process:  Coherent and Intact  Orientation:  Full (Time, Place, and Person)  Thought Content:  Hallucinations: Auditory   Suicidal Thoughts:  No  Homicidal Thoughts:  No  Memory:  Immediate;   Good Recent;   Good Remote;   Good  Judgement:  Fair  Insight:  Fair  Psychomotor Activity:  Normal  Concentration:  Good  Recall:  Good  Akathisia:  No  Handed:  Right  AIMS (if indicated):     Assets:  Desire for Improvement  Sleep:  Number of Hours: 6.5    Past Psychiatric History: Diagnosis: Schizophrenia   Hospitalizations: [facility]  Outpatient Care: None reported  Substance Abuse Care: None reported  Self-Mutilation: Denies  Suicidal Attempts: "Yes, by overdose"  Violent Behaviors: Denies   Past Medical History:   Past Medical History  Diagnosis Date  . Hypertension   . Glaucoma(365)   . GERD (gastroesophageal reflux disease)   . Schizophrenia, schizo-affective   . Schizoaffective disorder, depressive type     "dx'd in the 1990's"   Cardiac History:  HTN  Allergies:  No Known Allergies  PTA Medications: Prescriptions prior to admission  Medication Sig Dispense Refill  . amLODipine (NORVASC) 2.5 MG tablet Take 2.5 mg by mouth daily.      Marland Kitchen atenolol (TENORMIN) 25 MG tablet Take 25 mg by mouth daily.       . timolol (BETIMOL) 0.5 % ophthalmic solution Place 1 drop into both eyes 2 (two) times daily.          Previous Psychotropic Medications:  Medication/Dose  See medication lists               Substance Abuse History in the last 12 months:  Denies any alcohol/drug use  Consequences of Substance Abuse: Medical Consequences:  Liver damage, Possible death by overdose Legal Consequences:  Arrests, jail time, Loss of driving privilege. Family Consequences:  Family discord, divorce and or separation.  Social History:  reports that he has never smoked. He has never used smokeless tobacco. He reports that  drinks alcohol. 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 No pertinent family history.  Results for orders placed during the hospital encounter of 07/05/12 (from the past 72 hour(s))  GLUCOSE, CAPILLARY     Status: Abnormal   Collection Time    07/05/12  4:09 PM      Result Value Range   Glucose-Capillary 103 (*) 70 - 99 mg/dL   Comment 1  Notify RN    BASIC METABOLIC PANEL     Status: Abnormal   Collection Time    07/05/12  5:22 PM      Result Value Range   Sodium 143  135 - 145 mEq/L   Potassium 4.2  3.5 - 5.1 mEq/L   Chloride 109  96 - 112 mEq/L   CO2 24  19 - 32 mEq/L   Glucose, Bld 111 (*) 70 - 99 mg/dL   BUN 15  6 - 23 mg/dL   Creatinine, Ser 1.61  0.50 - 1.35 mg/dL   Calcium 9.2  8.4 - 09.6 mg/dL   GFR calc non Af Amer >90  >90 mL/min   GFR calc Af Amer >90  >90 mL/min   Comment:            The eGFR has been calculated     using the CKD EPI equation.     This calculation has not been     validated in all clinical     situations.     eGFR's persistently     <90 mL/min signify     possible Chronic Kidney Disease.  GLUCOSE, CAPILLARY     Status: Abnormal   Collection Time    07/05/12  8:46 PM      Result Value Range   Glucose-Capillary 107 (*) 70 - 99 mg/dL  COMPREHENSIVE METABOLIC PANEL     Status: None   Collection Time    07/06/12  4:20 AM       Result Value Range   Sodium 145  135 - 145 mEq/L   Potassium 3.7  3.5 - 5.1 mEq/L   Chloride 109  96 - 112 mEq/L   CO2 25  19 - 32 mEq/L   Glucose, Bld 97  70 - 99 mg/dL   BUN 13  6 - 23 mg/dL   Creatinine, Ser 0.45  0.50 - 1.35 mg/dL   Calcium 9.3  8.4 - 40.9 mg/dL   Total Protein 7.1  6.0 - 8.3 g/dL   Albumin 3.7  3.5 - 5.2 g/dL   AST 16  0 - 37 U/L   ALT 21  0 - 53 U/L   Alkaline Phosphatase 55  39 - 117 U/L   Total Bilirubin 0.8  0.3 - 1.2 mg/dL   GFR calc non Af Amer >90  >90 mL/min   GFR calc Af Amer >90  >90 mL/min   Comment:            The eGFR has been calculated     using the CKD EPI equation.     This calculation has not been     validated in all clinical     situations.     eGFR's persistently     <90 mL/min signify     possible Chronic Kidney Disease.  CBC     Status: Abnormal   Collection Time    07/06/12  7:55 AM      Result Value Range   WBC 8.8  4.0 - 10.5 K/uL   RBC 4.70  4.22 - 5.81 MIL/uL   Hemoglobin 13.3  13.0 - 17.0 g/dL   HCT 81.1  91.4 - 78.2 %   MCV 84.3  78.0 - 100.0 fL   MCH 28.3  26.0 - 34.0 pg   MCHC 33.6  30.0 - 36.0 g/dL   RDW 95.6 (*) 21.3 - 08.6 %   Platelets 201  150 - 400 K/uL  BASIC METABOLIC  PANEL     Status: Abnormal   Collection Time    07/07/12  5:25 AM      Result Value Range   Sodium 145  135 - 145 mEq/L   Potassium 3.8  3.5 - 5.1 mEq/L   Chloride 111  96 - 112 mEq/L   CO2 24  19 - 32 mEq/L   Glucose, Bld 88  70 - 99 mg/dL   BUN 21  6 - 23 mg/dL   Creatinine, Ser 4.09  0.50 - 1.35 mg/dL   Calcium 9.0  8.4 - 81.1 mg/dL   GFR calc non Af Amer 64 (*) >90 mL/min   GFR calc Af Amer 74 (*) >90 mL/min   Comment:            The eGFR has been calculated     using the CKD EPI equation.     This calculation has not been     validated in all clinical     situations.     eGFR's persistently     <90 mL/min signify     possible Chronic Kidney Disease.   Psychological Evaluations:  Assessment:   AXIS I:  Schizophrenia,   AXIS II:  Deferred AXIS III:   Past Medical History  Diagnosis Date  . Hypertension   . Glaucoma(365)   . GERD (gastroesophageal reflux disease)   . Schizophrenia, schizo-affective   . Schizoaffective disorder, depressive type     "dx'd in the 1990's"   AXIS IV:  occupational problems, other psychosocial or environmental problems and problems with primary support group AXIS V:  11-20 some danger of hurting self or others possible OR occasionally fails to maintain minimal personal hygiene OR gross impairment in communication  Treatment Plan/Recommendations: 1. Admit for crisis management and stabilization, estimated length of stay 3-5 days.  2. Medication management to reduce current symptoms to base line and improve the patient's overall level of functioning  3. Treat health problems as indicated.  4. Develop treatment plan to decrease risk of relapse upon discharge and the need for readmission.  5. Psycho-social education regarding relapse prevention and self care.  6. Health care follow up as needed for medical problems.  7. Review, reconcile, and reinstate any pertinent home medications for other health issues where appropriate. 8. Call for consults with hospitalist for any additional specialty patient care services as needed.  Treatment Plan Summary: Daily contact with patient to assess and evaluate symptoms and progress in treatment Medication management Supportive approach/coping skills/improve reality testing Current Medications:  Current Facility-Administered Medications  Medication Dose Route Frequency Provider Last Rate Last Dose  . acetaminophen (TYLENOL) tablet 650 mg  650 mg Oral Q6H PRN Karolee Stamps, NP      . alum & mag hydroxide-simeth (MAALOX/MYLANTA) 200-200-20 MG/5ML suspension 30 mL  30 mL Oral Q4H PRN Karolee Stamps, NP      . amLODipine (NORVASC) tablet 2.5 mg  2.5 mg Oral Daily Sanjuana Kava, NP      . atenolol (TENORMIN) tablet 25 mg  25 mg Oral Daily  Sanjuana Kava, NP      . hydrOXYzine (ATARAX/VISTARIL) tablet 50 mg  50 mg Oral QHS PRN Karolee Stamps, NP      . magnesium hydroxide (MILK OF MAGNESIA) suspension 30 mL  30 mL Oral Daily PRN Karolee Stamps, NP      . OLANZapine [facility]) tablet 10 mg  10 mg Oral QHS Sanjuana Kava, NP      .  timolol (BETIMOL) 0.5 % ophthalmic solution 1 drop  1 drop Both Eyes BID Sanjuana Kava, NP        Observation Level/Precautions:  15 minute checks  Laboratory:  Reviewed ED lab findings on file  Psychotherapy:  Group sessions  Medications:  See medication lists  Consultations: As needed   Discharge Concerns:  Safety/stability  Estimated LOS: 5-7 days  Other:     I certify that inpatient services furnished can reasonably be expected to improve the patient's condition.   Armandina Stammer I 4/4/20142:40 PM
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
## 2012-07-09 DIAGNOSIS — F209 Schizophrenia, unspecified: Principal | ICD-10-CM

## 2012-07-09 MED ORDER — OLANZAPINE 7.5 MG PO TABS
15.0000 mg | ORAL_TABLET | Freq: Every day | ORAL | Status: DC
  Administered 2012-07-09 – 2012-07-10 (×2): 15 mg via ORAL
  Filled 2012-07-09 (×3): qty 2
  Filled 2012-07-09: qty 6

## 2012-07-09 NOTE — Progress Notes (Signed)
[facility] MD Progress Note  07/09/2012 3:39 PM Jimmy Marshall  MRN:  [id number]  Subjective:  "I feel fine. Just did not sleep that much. Olanzapine normally helps me sleep well. Heard voices last 2 days ago"  Diagnosis:   Axis I: Schizophrenia, schizoaffective Axis II: Deferred Axis III:  Past Medical History  Diagnosis Date  . Hypertension   . Glaucoma(365)   . GERD (gastroesophageal reflux disease)   . Schizophrenia, schizo-affective   . Schizoaffective disorder, depressive type     "dx'd in the 1990's"   Axis IV: other psychosocial or environmental problems Axis V: 41-50 serious symptoms  ADL's:  Intact  Sleep: Fair  Appetite:  Good  Suicidal Ideation:  Plan:  Denies Intent:  Denies Means:  Denies Homicidal Ideation:  Plan:  denies Intent:  Denies Means:  Denies  AEB (as evidenced by):  Psychiatric Specialty Exam: Review of Systems  Constitutional: Negative.   HENT: Negative.   Eyes: Negative.   Respiratory: Negative.   Cardiovascular: Negative.   Gastrointestinal: Negative.   Genitourinary: Negative.   Musculoskeletal: Negative.   Skin: Negative.   Neurological: Negative.   Endo/Heme/Allergies: Negative.   Psychiatric/Behavioral: Negative.     Blood pressure 137/86, pulse 64, temperature 98.4 F (36.9 C), temperature source Oral, resp. rate 20, height 5\' 11"  (1.803 m), weight 86.183 kg (190 lb).Body mass index is 26.51 kg/(m^2).  General Appearance: Casual  Eye Contact::  Good  Speech:  Clear and Coherent  Volume:  Normal  Mood:  "I feel good"  Affect:  Congruent  Thought Process:  Intact  Orientation:  Full (Time, Place, and Person)  Thought Content:  WDL  Suicidal Thoughts:  No  Homicidal Thoughts:  No  Memory:  Immediate;   Good Recent;   Good Remote;   Good  Judgement:  Fair  Insight:  Fair  Psychomotor Activity:  Normal  Concentration:  Good  Recall:  Good  Akathisia:  No  Handed:  Right  AIMS (if indicated):     Assets:  Desire for  Improvement  Sleep:  Number of Hours: 6   Current Medications: Current Facility-Administered Medications  Medication Dose Route Frequency Provider Last Rate Last Dose  . acetaminophen (TYLENOL) tablet 650 mg  650 mg Oral Q6H PRN Karolee Stamps, NP      . alum & mag hydroxide-simeth (MAALOX/MYLANTA) 200-200-20 MG/5ML suspension 30 mL  30 mL Oral Q4H PRN Karolee Stamps, NP      . amLODipine (NORVASC) tablet 2.5 mg  2.5 mg Oral Daily Sanjuana Kava, NP   2.5 mg at 07/09/12 0908  . atenolol (TENORMIN) tablet 25 mg  25 mg Oral Daily Sanjuana Kava, NP   25 mg at 07/09/12 0908  . hydrOXYzine (ATARAX/VISTARIL) tablet 50 mg  50 mg Oral QHS PRN Karolee Stamps, NP      . magnesium hydroxide (MILK OF MAGNESIA) suspension 30 mL  30 mL Oral Daily PRN Karolee Stamps, NP      . OLANZapine [facility]) tablet 15 mg  15 mg Oral QHS Sanjuana Kava, NP      . timolol (BETIMOL) 0.5 % ophthalmic solution 1 drop  1 drop Both Eyes BID Sanjuana Kava, NP   1 drop at 07/09/12 0909    Lab Results: No results found for this or any previous visit (from the past 48 hour(s)).  Physical Findings: AIMS: Facial and Oral Movements Muscles of Facial Expression: None, normal Lips and Perioral Area: None, normal Jaw:  None, normal Tongue: None, normal,Extremity Movements Upper (arms, wrists, hands, fingers): None, normal Lower (legs, knees, ankles, toes): None, normal, Trunk Movements Neck, shoulders, hips: None, normal, Overall Severity Severity of abnormal movements (highest score from questions above): None, normal Incapacitation due to abnormal movements: None, normal Patient's awareness of abnormal movements (rate only patient's report): No Awareness, Dental Status Current problems with teeth and/or dentures?: No Does patient usually wear dentures?: No  CIWA:    COWS:     Treatment Plan Summary: Daily contact with patient to assess and evaluate symptoms and progress in treatment Medication management  Plan:  Supportive approach/coping skills/relapse prevention. Will increase Olanzapine from 10 mg to 15 mg bedtime for symptoms of hallucinations.. Encouraged out of room, participation in group sessions and application of coping skills when distressed. Will continue to monitor response to/adverse effects of medications in use to assure effectiveness. Continue to monitor mood, behavior and interaction with staff and other patients. Continue current plan of care.  Medical Decision Making Problem Points:  Established problem, stable/improving (1), Review of last therapy session (1) and Review of psycho-social stressors (1) Data Points:  Review of medication regiment & side effects (2) Review of new medications or change in dosage (2)  I certify that inpatient services furnished can reasonably be expected to improve the patient's condition.   Armandina Stammer I 07/09/2012, 3:39 PM

## 2012-07-09 NOTE — Progress Notes (Signed)
Jimmy Marshall is seen OOB UAL on the 300 hall today. HE remains quiet, sad and isolative, not interacting or speaking with the other pts. HE takes his meds as ordered,he denies physical and / or emotional pain, he completes his AM self inventory and on it he writes he denies having SI within the past 24 hrs, he rated his feelings of depression and hopelessness " 8 / 8 " and he states his DC plan is to " not allow personal problems to accumulate".   A He does attend groups, pays attention to the group discussions but does not participate in the discussion.   R Safety is maintained and POC includes  Increasing her zyprexa to 15 mg tonight.

## 2012-07-09 NOTE — Progress Notes (Signed)
goals  Group Note  Date:  07/09/2012 Time: 0930  Group Topic/Focus:  Identifying Goals :   The focus of this group is to help patients identify their personal goals. Pt stated his goal today is to contqact his caregiver and apologize to this individual.  Participation Level:  active Participation Quality: good Affect: flat Cognitive:  good  Insight:  good  Engagement in Group: engaged  Additional Comments: PDuke RN [facility]

## 2012-07-09 NOTE — [facility] Group Notes (Signed)
[facility] LCSW Group Therapy  07/09/2012 10am  Type of Therapy:  Group Therapy  Participation Level:  Active  Participation Quality:  Appropriate, Attentive, Sharing and Supportive  Affect:  Blunted  Cognitive:  Alert, Appropriate and Oriented  Insight:  Engaged  Engagement in Therapy:  Engaged  Modes of Intervention:  Exploration  Summary of Progress/Problems:  The main focus of today's process group was for the patient to identify ways in which they have in the past sabotaged their own recovery. Motivational Interviewing was utilized to ask the group members what they get out of their substance use, and how their behavior interferes with who they want to be.  The Stages of Change were explained, and members rated their motivation to change on a scale of 0 (no desire) to 10 (completely committed).  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  He recently had throat surgery and thus did not speak often, was very quiet.  His motivation to remain sober is 10 out of 10.  Sarina Ser 07/09/2012, 12:48 PM

## 2012-07-09 NOTE — Progress Notes (Signed)
D.  Pt pleasant on approach, states that he is feeling tired and requested his night time medication as soon as he can have it.  Positive for evening group.  Pt is appropriate on unit.  No complaints voiced.  Denies SI/HI/hallucinations at this time but has had some auditory hallucinations at times today.  A.  Support and encouragement offered, medication given as ordered  R.  Pt remains safe on unit, will continue to monitor.

## 2012-07-10 MED ORDER — AMLODIPINE BESYLATE 5 MG PO TABS
5.0000 mg | ORAL_TABLET | Freq: Every day | ORAL | Status: DC
  Administered 2012-07-10 – 2012-07-11 (×2): 5 mg via ORAL
  Filled 2012-07-10 (×3): qty 1

## 2012-07-10 MED ORDER — AMLODIPINE BESYLATE 5 MG PO TABS: 5.0000 mg | ORAL_TABLET | Freq: Every day | ORAL | Status: DC

## 2012-07-10 NOTE — Progress Notes (Signed)
Psychoeducational Group Note  Date:  07/10/2012 Time: 1015 Group Topic/Focus:  Making Healthy Choices:   The focus of this group is to help patients identify negative/unhealthy choices they were using prior to admission and identify positive/healthier coping strategies to replace them upon discharge.  Participation Level:  Active  Participation Quality:  Appropriate  Affect:  Appropriate  Cognitive:  Appropriate  Insight:  Engaged  Engagement in Group:  Engaged  Additional Comments:    Rich Brave 4:12 PM. 07/10/2012

## 2012-07-10 NOTE — Progress Notes (Signed)
Self Inventory  Group Note  Date:  07/10/2012 Time:  0900 Group Topic/Focus:  Identifying things to be thankful for:    The focus of this group is to help patients identify  Things / people / events in their lives that they are greatful for. Participation Level:  Active  Participation Quality:  Appropriate  Affect:  Appropriate  Cognitive:  Alert  Insight:  Engaged  Engagement in Group:  Engaged  Additional Comments:    PD RN BC  Rich Brave 9:30 AM. 07/10/2012

## 2012-07-10 NOTE — Progress Notes (Signed)
D.  Pt pleasant, brightens on approach.  Denied complaints, denied SI/HI/hallucinations at this time.  Positive for evening AA group.  Interacting appropriately within milieu.  Told day shift RN that he has learned a great deal from being here, re-directed inappropriate comments by peers in day time group. A.  Support, encouragement, and praise offered  R.  Pt remains safe on unit, will continue to monitor.

## 2012-07-10 NOTE — Progress Notes (Signed)
Patient ID: Jimmy Marshall, male   DOB: October 20, 1955, 57 y.o.   MRN: [id number] [facility] MD Progress Note  07/10/2012 3:06 PM Jimmy Marshall  MRN:  [id number]  Subjective:  "I am feeling fine. I have not heard the voices in 2 days. I am no longer suicidal. I hope that I get to be discharged on Monday (Tomorrow). I have a job that [employer] starting on Tuesday. I have not worked in many years. I know having something to do on daily basis will help me cope better. I'm also hoping that you guys will recommend that I get someone to come and help me around the house. I mean a home health aid worker".   Diagnosis:   Axis I: Schizophrenia, schizoaffective Axis II: Deferred Axis III:  Past Medical History  Diagnosis Date  . Hypertension   . Glaucoma(365)   . GERD (gastroesophageal reflux disease)   . Schizophrenia, schizo-affective   . Schizoaffective disorder, depressive type     "dx'd in the 1990's"   Axis IV: other psychosocial or environmental problems Axis V: 41-50 serious symptoms  ADL's:  Intact  Sleep: Fair  Appetite:  Good  Suicidal Ideation:  Plan:  Denies Intent:  Denies Means:  Denies Homicidal Ideation:  Plan:  denies Intent:  Denies Means:  Denies  AEB (as evidenced by):  Psychiatric Specialty Exam: Review of Systems  Constitutional: Negative.   HENT: Negative.   Eyes: Negative.   Respiratory: Negative.   Cardiovascular: Negative.   Gastrointestinal: Negative.   Genitourinary: Negative.   Musculoskeletal: Negative.   Skin: Negative.   Neurological: Negative.   Endo/Heme/Allergies: Negative.   Psychiatric/Behavioral: Negative.     Blood pressure 141/95, pulse 63, temperature 98.2 F (36.8 C), temperature source Oral, resp. rate 20, height 5\' 11"  (1.803 m), weight 86.183 kg (190 lb).Body mass index is 26.51 kg/(m^2).  General Appearance: Casual  Eye Contact::  Good  Speech:  Clear and Coherent  Volume:  Normal  Mood:  "I feel good"  Affect:  Congruent  Thought  Process:  Intact  Orientation:  Full (Time, Place, and Person)  Thought Content:  WDL  Suicidal Thoughts:  No  Homicidal Thoughts:  No  Memory:  Immediate;   Good Recent;   Good Remote;   Good  Judgement:  Fair  Insight:  Fair  Psychomotor Activity:  Normal  Concentration:  Good  Recall:  Good  Akathisia:  No  Handed:  Right  AIMS (if indicated):     Assets:  Desire for Improvement  Sleep:  Number of Hours: 6.25   Current Medications: Current Facility-Administered Medications  Medication Dose Route Frequency Provider Last Rate Last Dose  . acetaminophen (TYLENOL) tablet 650 mg  650 mg Oral Q6H PRN Karolee Stamps, NP      . alum & mag hydroxide-simeth (MAALOX/MYLANTA) 200-200-20 MG/5ML suspension 30 mL  30 mL Oral Q4H PRN Karolee Stamps, NP      . Melene Muller ON 07/11/2012] amLODipine (NORVASC) tablet 5 mg  5 mg Oral Daily Sanjuana Kava, NP      . amLODipine (NORVASC) tablet 5 mg  5 mg Oral Daily Sanjuana Kava, NP      . atenolol (TENORMIN) tablet 25 mg  25 mg Oral Daily Sanjuana Kava, NP   25 mg at 07/10/12 0741  . hydrOXYzine (ATARAX/VISTARIL) tablet 50 mg  50 mg Oral QHS PRN Karolee Stamps, NP      . magnesium hydroxide (MILK OF MAGNESIA) suspension 30 mL  30 mL Oral Daily PRN Karolee Stamps, NP      . OLANZapine [facility]) tablet 15 mg  15 mg Oral QHS Sanjuana Kava, NP   15 mg at 07/09/12 2123  . timolol (BETIMOL) 0.5 % ophthalmic solution 1 drop  1 drop Both Eyes BID Sanjuana Kava, NP   1 drop at 07/10/12 0741    Lab Results: No results found for this or any previous visit (from the past 48 hour(s)).  Physical Findings: AIMS: Facial and Oral Movements Muscles of Facial Expression: None, normal Lips and Perioral Area: None, normal Jaw: None, normal Tongue: None, normal,Extremity Movements Upper (arms, wrists, hands, fingers): None, normal Lower (legs, knees, ankles, toes): None, normal, Trunk Movements Neck, shoulders, hips: None, normal, Overall Severity Severity of  abnormal movements (highest score from questions above): None, normal Incapacitation due to abnormal movements: None, normal Patient's awareness of abnormal movements (rate only patient's report): No Awareness, Dental Status Current problems with teeth and/or dentures?: No Does patient usually wear dentures?: No  CIWA:    COWS:     Treatment Plan Summary: Daily contact with patient to assess and evaluate symptoms and progress in treatment Medication management  Plan: Supportive approach/coping skills/relapse prevention. Continue Olanzapine at15 mg bedtime for symptoms of hallucinations. Increase Norvasc from 2.5 mg to 5 mg daily for HTN Encouraged out of room, participation in group sessions and application of coping skills when distressed. Will continue to monitor response to/adverse effects of medications in use to assure effectiveness. Continue to monitor mood, behavior and interaction with staff and other patients. Continue current plan of care.  Medical Decision Making Problem Points:  Established problem, stable/improving (1), Review of last therapy session (1) and Review of psycho-social stressors (1) Data Points:  Review of medication regiment & side effects (2) Review of new medications or change in dosage (2)  I certify that inpatient services furnished can reasonably be expected to improve the patient's condition.   Armandina Stammer I 07/10/2012, 3:06 PM

## 2012-07-10 NOTE — Progress Notes (Signed)
Jimmy Marshall remains quiet, sad and depressed. HE is compliant with his POC, he attends his groups, is very much engaged in his POC and demonstrates much understanding and insight into his behaviors and illness.   A He shared with his peers in [employer] group today  that he has learned his disease " through these past 3 years" and he is healthier for it. He completed his AM self inventory and on it he wrote he denied SI within the past 24 hrs, he rated his depression and hopelessness " 8 / 8 " and stated his DC plan is to " try not to let little situations turn into big problems and not abuse meds".   R Safety is in place and POC contd with therapeuitc relationship fostered.

## 2012-07-10 NOTE — [facility] Group Notes (Signed)
[facility] LCSW Group Therapy  07/10/2012 10:00-11:00am  Summary of Progress/Problems: The main focus of today's process group was to identify the patient's current support system and decide on other supports that can be put in place. Four definitions/levels of support were discussed and an exercise was utilized to show how much stronger we become with additional supports providing accountability, improved physical and emotional health, and better problem-solving skills. An emphasis was placed on using counselor, doctor, therapy groups, 12-step groups, and problem-specific support groups to expand supports. The patient expressed that he has dealt with the diagnosis of schizophrenia since the age of 55, and talked about how important it was for him to be able to look at himself in the mirror.  He stated that the first thing that happened when a therapist started him looking at himself in the mirror 10 minutes a day is he started to take care of his hygiene.  Type of Therapy:  Group Therapy  Participation Level:  Active  Participation Quality:  Appropriate, Attentive and Sharing  Affect:  Appropriate  Cognitive:  Alert, Appropriate and Oriented  Insight:  Engaged  Engagement in Therapy:  Engaged  Modes of Intervention:  Education, Exploration and Support   Sarina Ser 07/10/2012, 12:19 PM

## 2012-07-10 NOTE — Progress Notes (Signed)
[facility] Group Notes:  (Nursing/MHT/Case Management/Adjunct)  Date:  07/09/2012  Time:  2100 Type of Therapy:  wrap up group  Participation Level:  Active  Participation Quality:  Appropriate, Attentive, Sharing and Supportive  Affect:  Appropriate  Cognitive:  Alert and Appropriate  Insight:  Good  Engagement in Group:  Engaged  Modes of Intervention:  Clarification, Education and Support  Summary of Progress/Problems:  Pt reported feeling very supported after his brother visited from [place] today.   Pt does want to leave as soon as possible due to a job interview he is really looking forward to on Tuesday April 8th.   Shelah Lewandowsky 07/10/2012, 3:57 AM

## 2012-07-11 DIAGNOSIS — F332 Major depressive disorder, recurrent severe without psychotic features: Secondary | ICD-10-CM

## 2012-07-11 MED ORDER — AMLODIPINE BESYLATE 5 MG PO TABS: 5.0000 mg | ORAL_TABLET | Freq: Every day | ORAL | Status: DC

## 2012-07-11 MED ORDER — OLANZAPINE 15 MG PO TABS: 15.0000 mg | ORAL_TABLET | Freq: Every day | ORAL | Status: DC

## 2012-07-11 MED ORDER — HYDROXYZINE HCL 50 MG PO TABS: 50.0000 mg | ORAL_TABLET | Freq: Every evening | ORAL | Status: DC | PRN

## 2012-07-11 MED ORDER — ATENOLOL 25 MG PO TABS: 25.0000 mg | ORAL_TABLET | Freq: Every day | ORAL | Status: DC

## 2012-07-11 MED ORDER — TIMOLOL HEMIHYDRATE 0.5 % OP SOLN: 1.0000 [drp] | Freq: Two times a day (BID) | OPHTHALMIC | Status: DC

## 2012-07-11 NOTE — [facility] Group Notes (Signed)
[facility] LCSW Group Therapy  07/11/2012 4:15 PM  Type of Therapy:  Group Therapy  Participation Level:  Active  Participation Quality:  Attentive and Sharing  Affect:  Appropriate  Cognitive:  Alert and Oriented  Insight:  Developing/Improving  Engagement in Therapy:  Engaged  Modes of Intervention:  Clarification, Confrontation, Discussion, Education, Exploration, Limit-setting, Orientation, Problem-solving, Rapport Building, Dance movement psychotherapist, Socialization and Support  Summary of Progress/Problems: The topic for group today was overcoming obstacles.  Pt discussed overcoming obstacles and what this means for pt. Pt stated that his obstacle is securing a job in the future. Pt reported that he has a potential interview tomorrow for a job and that he is in the initial phases of buying a house. Pt was observed to be receptive to his peers as they provided emotional support and encouraged him to keep a positive outlook on life. Pt verbalized his agreement and stated "I know things will get better with time". Pt ended the session in a stable mood.   Paulino Door, Korah Hufstedler C 07/11/2012, 4:15 PM

## 2012-07-11 NOTE — Progress Notes (Signed)
Patient did attend the first part of the evening speaker AA meeting. Pt left group and returned to his room. Pt stated, when asked, "I don't like that AA says once your an addict you are always labeled an addict."

## 2012-07-11 NOTE — Progress Notes (Signed)
Adult Psychoeducational Group Note  Date:  07/11/2012 Time:  1:40 PM  Group Topic/Focus:  Self Care:   The focus of this group is to help patients understand the importance of self-care in order to improve or restore emotional, physical, spiritual, interpersonal, and financial health.  Participation Level:  Active  Participation Quality:  Appropriate  Affect:  Appropriate  Cognitive:  Appropriate  Insight: Appropriate  Engagement in Group:  Engaged  Modes of Intervention:  Discussion  Additional Comments:  Pt was appropriate and sharing while attending group. Pt stated that he loves to schedule dates with his spouse and schedule time with his children. Pt also communicated that it is hard for him to ask for help when he needs it.   Sharyn Lull 07/11/2012, 1:40 PM

## 2012-07-11 NOTE — [facility] Suicide Risk Assessment (Signed)
Suicide Risk Assessment  Discharge Assessment     Demographic Factors:  Male  Mental Status Per Nursing Assessment::   On Admission:  NA  Current Mental Status by Physician: In full contact with reality. His mood is worried, affect is appropriate. He denies suicidal ideas, plans or intent. He is wanting to be discharged today. He is still dealing with the uncertainty of what is going to happen in terms of who can help him at home. His care taker is going to do it for a little while longer. He states he has a job interview through [employer] and he wants to be there. Thinks that having the structure of a job could keep him from thinking, worrying.   Loss Factors: Loss of significant relationship  Historical Factors: NA  Risk Reduction Factors:   Positive social support  Continued Clinical Symptoms:  Schizophrenia:   Depressive state  Cognitive Features That Contribute To Risk:  Closed-mindedness Thought constriction (tunnel vision)    Suicide Risk:  Mild:  Suicidal ideation of limited frequency, intensity, duration, and specificity.  There are no identifiable plans, no associated intent, mild dysphoria and related symptoms, good self-control (both objective and subjective assessment), few other risk factors, and identifiable protective factors, including available and accessible social support.  Discharge Diagnoses:   AXIS I:  Schizoaffective Disorder AXIS II:  Deferred AXIS III:   Past Medical History  Diagnosis Date  . Hypertension   . Glaucoma(365)   . GERD (gastroesophageal reflux disease)   . Schizophrenia, schizo-affective   . Schizoaffective disorder, depressive type     "dx'd in the 1990's"   AXIS IV:  problems with primary support group, housing problems AXIS V:  61-70 mild symptoms  Plan Of Care/Follow-up recommendations:  Activity:  as tolerated Diet:  regular Continue follow up outpatient basis/continue to take medications Is patient on multiple  antipsychotic therapies at discharge:  No   Has Patient had three or more failed trials of antipsychotic monotherapy by history:  No  Recommended Plan for Multiple Antipsychotic Therapies: N/A   Roshaunda Starkey A 07/11/2012, 1:19 PM

## 2012-07-11 NOTE — Progress Notes (Signed)
Patient denies SI/HI, denies A/V hallucinations. Patient verbalizes understanding of discharge instructions, follow up care and prescriptions. Patient given all belongings from [facility] locker. Patient escorted out by staff, transported by family. 

## 2012-07-11 NOTE — Progress Notes (Signed)
Adult Psychoeducational Group Note  Date:  07/11/2012 Time:  11:06 AM  Group Topic/Focus:  Coping With Mental Health Crisis:   The purpose of this group is to help patients identify strategies for coping with mental health crisis.  Group discusses possible causes of crisis and ways to manage them effectively.  Participation Level:  Active  Participation Quality:  Appropriate and Attentive  Affect:  Appropriate  Cognitive:  Alert and Appropriate  Insight: Good  Engagement in Group:  Engaged  Modes of Intervention:  Activity and Discussion  Additional Comments:  Pt  Participated in group and is here to recover from substance abuse  Shantese Raven T 07/11/2012, 11:06 AM

## 2012-07-11 NOTE — [facility] Group Notes (Signed)
[facility] LCSW Aftercare Discharge Planning Group Note   07/11/2012 8:45 AM  Participation Quality:  Appropriate  Mood/Affect:  Anxious, Yet Appropriate  Depression Rating:  3  Anxiety Rating:  6, Patient reports that is his baseline  Thoughts of Suicide:  No  Will you contract for safety?   NA  Current AVH:  Not at present, yet patient reports he has AVH at baseline  Plan for Discharge/Comments:  Patient to return home, follow up at [facility] and go to orientation class tomorrow at [facility] with Mrs [employer]: Payee or brother  Supports: Brother, Theme park manager and other relatives, Mrs Hyacinth Meeker and therapist at [employer], Julious Payer

## 2012-07-11 NOTE — Tx Team (Signed)
Interdisciplinary Treatment Plan Update (Adult)  Date: 07/11/2012  Time Reviewed: 11:13 AM   Progress in Treatment: Attending groups: Yes Participating in groups: Yes Taking medication as prescribed:  Yes Tolerating medication:  Yes Family/Significant othe contact made: Yes, with Payee Patient understands diagnosis: Yes Discussing patient identified problems/goals with staff: Yes Medical problems stabilized or resolved:  Yes Denies suicidal/homicidal ideation: Yes Patient has not harmed self or Others: Yes  New problem(s) identified: None Identified  Discharge Plan or Barriers:  CSW will refer to [facility]   Additional comments: N/A  Reason for Continuation of Hospitalization: NA   Estimated length of stay: Discharge today  For review of initial/current patient goals, please see plan of care.  Attendees: Patient:  Geovannie 07/11/2012 11:13 AM  Family:     Physician:  Geoffery Lyons 07/11/2012 11:13 AM   Nursing:  07/11/2012 11:13 AM   Clinical Social Worker Ronda Fairly 07/11/2012 11:13 AM   Other:  Berneice Heinrich, RN 07/11/2012 11:13AM   Other:  Georgina Quint, [place] Student 07/11/2012 11:13 AM   Other:     Other:      Scribe for Treatment Team:   Carney Bern, LCSWA  07/11/2012 11:13 AM

## 2012-07-15 NOTE — Progress Notes (Signed)
Patient Discharge Instructions:  After Visit Summary (AVS):   Faxed to:  07/15/12 Discharge Summary Note:   Faxed to:  07/15/12 Psychiatric Admission Assessment Note:   Faxed to:  07/15/12 Suicide Risk Assessment - Discharge Assessment:   Faxed to:  07/15/12 Faxed/Sent to the Next Level Care provider:  07/15/12 Faxed to [facility] @ [phone]  Jerelene Redden, 07/15/2012, 3:57 PM

## 2012-07-15 NOTE — Discharge Summary (Signed)
Physician Discharge Summary Note  Patient:  Jimmy Marshall is an 57 y.o., male MRN:  [id number] DOB:  25-May-1955 Patient phone:  [phone] (home)  Patient address:   [address] [place] [address],   Date of Admission:  07/07/2012 Date of Discharge: 07/11/2012  Reason for Admission:  Depression with suicide attempt by overdose  Discharge Diagnoses: Active Problems:   * No active hospital problems. *  Review of Systems  Constitutional: Negative.   HENT: Negative.   Eyes: Negative.   Respiratory: Negative.   Cardiovascular: Negative.   Gastrointestinal: Negative.   Genitourinary: Negative.   Musculoskeletal: Negative.   Skin: Negative.   Neurological: Negative.   Endo/Heme/Allergies: Negative.   Psychiatric/Behavioral: Positive for substance abuse.   Axis Diagnosis:   AXIS I:  Major Depression, Recurrent severe; Schizophrenia with psychosis AXIS II:  Deferred AXIS III:   Past Medical History  Diagnosis Date  . Hypertension   . Glaucoma(365)   . GERD (gastroesophageal reflux disease)   . Schizophrenia, schizo-affective   . Schizoaffective disorder, depressive type     "dx'd in the 1990's"   AXIS IV:  other psychosocial or environmental problems, problems related to social environment and problems with primary support group AXIS V:  61-70 mild symptoms  Level of Care:  OP  Hospital Course:  On admission:  Admitted to [facility] from the [facility] ED with complaints of suicide attempt by overdose. Patient reports, I was taken to the [facility] on 07/05/12 because I attempted to kill myself. I took an overdose of Olanzapine that I was taking for my schizophrenia. The reason for my suicide attempt was because I felt hopeless in an instant. I have a lady that helped me with cooking, cleaning and taking my medicines. I live by myself. I always look forward to seeing her. But, she recently told me that she was not going to be coming to help me anymore. She  said that the driving from [place] to [place] to help is getting to be too much for her. She worked for me last 07/04/12. I don't have anyone else to help me. To me, life was not worth living at that point. So, I decided that I'm going to end it. So, I took the pills. Olazapine works well for me. I would like to go back to taking it. I would not attempt to hurt myself no more. Besides, my only daughter told me to never try that again, I gave her my world. I'm feeling a lot better. I'm not depressed. The only thing is that when my schizophrenia is on, I hear voices, see stuff and smell stuff that does not make sence to me. But, this is not going on now. The last time that I heard voices was yesterday".  During hospitalization:  Rayquan attended and participated in groups, he obtained an interview for a job and wants to get back to work, excited about the prospect.  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  Medication managed:  Blood pressure medications continued, Vistaril 25 mg for anxiety ordered and Zyprexa 15 mg at bedtime for his Schizophrenia psychosis.  Patient denied suicidal/homicidal ideations and auditory/visual hallucinations, follow-up appointments encouraged to attend, Rx given at discharge.  Dedrick is mentally and physically stable for discharge.  Consults:  None  Significant Diagnostic Studies:  labs: Completed and reviewed, stable  Discharge Vitals:   Blood pressure 130/85, pulse 84, temperature 97.2 F (36.2 C), temperature source Oral, resp. rate 24, height 5\' 11"  (1.803 m), weight 86.183 kg (190 lb). Body mass index is 26.51 kg/(m^2). Lab Results:   No results found for this or any previous visit (from the past 72 hour(s)).  Physical Findings: AIMS: Facial and Oral Movements Muscles of Facial Expression: None, normal Lips and Perioral Area: None, normal Jaw: None,  normal Tongue: None, normal,Extremity Movements Upper (arms, wrists, hands, fingers): None, normal Lower (legs, knees, ankles, toes): None, normal, Trunk Movements Neck, shoulders, hips: None, normal, Overall Severity Severity of abnormal movements (highest score from questions above): None, normal Incapacitation due to abnormal movements: None, normal Patient's awareness of abnormal movements (rate only patient's report): No Awareness, Dental Status Current problems with teeth and/or dentures?: No Does patient usually wear dentures?: No  CIWA:    COWS:     Psychiatric Specialty Exam: See Psychiatric Specialty Exam and Suicide Risk Assessment completed by Attending Physician prior to discharge.  Discharge destination:  Home  Is patient on multiple antipsychotic therapies at discharge:  No   Has Patient had three or more failed trials of antipsychotic monotherapy by history:  No Recommended Plan for Multiple Antipsychotic Therapies:  N/A  Discharge Orders   Future Orders Complete By Expires     Activity as tolerated - No restrictions  As directed     Diet - low sodium heart healthy  As directed         Medication List    TAKE these medications     Indication   amLODipine 5 MG tablet  Commonly known as:  NORVASC  Take 1 tablet (5 mg total) by mouth daily.   Indication:  High Blood Pressure     atenolol 25 MG tablet  Commonly known as:  TENORMIN  Take 1 tablet (25 mg total) by mouth daily.   Indication:  High Blood Pressure     hydrOXYzine 50 MG tablet  Commonly known as:  ATARAX/VISTARIL  Take 1 tablet (50 mg total) by mouth at bedtime as needed for anxiety (sleep).      OLANZapine 15 MG tablet  Commonly known as:  ZYPREXA  Take 1 tablet (15 mg total) by mouth at bedtime.   Indication:  Schizophrenia     timolol 0.5 % ophthalmic solution  Commonly known as:  BETIMOL  Place 1 drop into both eyes 2 (two) times daily.   Indication:  Increased Pressure Within the Eye            Follow-up Information   Follow up with    [facility] On 07/14/2012. (You will see Dr. August Saucer on Thursday, July 14, 2012 at 8:15 AM)    Contact information:   [address]  [place], [place]   [address]  [phone]      Follow-up recommendations:  Activity:  As tolerated Diet:  Low-sodium heart healthy diet Continue to work on coping skills/stress management Comments:  Patient will continue his care at [facility].  Total Discharge Time:  Greater than 30 minutes.  SignedNanine Means, PMH-NP 07/15/2012, 3:30 PM

## 2012-10-23 IMAGING — PT NM PET TUM IMG INITIAL (PI) SKULL BASE T - THIGH
1 of 6 series · 2 of 25 positions shown · non-contrast
Comparison: CT neck dated 05/25/2011

CLINICAL DATA: Initial treatment strategy for laryngeal neoplasm.

NUCLEAR MEDICINE PET CT INITIAL (PI) SKULL BASE TO THIGH
TECHNIQUE: 18.9 mCi F-18 FDG was injected intravenously via the
left antecubital fossa.  Full-ring PET imaging was performed from
the skull base through the mid-thighs 63  minutes after injection.
CT data was obtained and used for attenuation correction and
anatomic localization only.  (This was not acquired as a diagnostic
CT examination.)
Fasting Blood Glucose:  93

[Series 2: ct neck · axial · 3.8mm · 0.98mm/px · z∈[-147,+0]mm · 2 of 91 slices shown]
[im 46/91  brain]
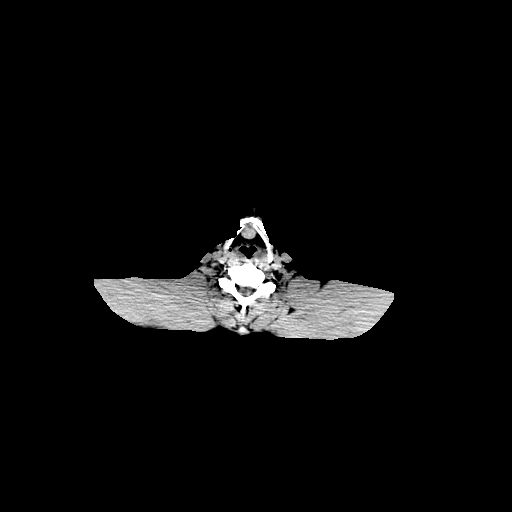
[im 91/91  brain]
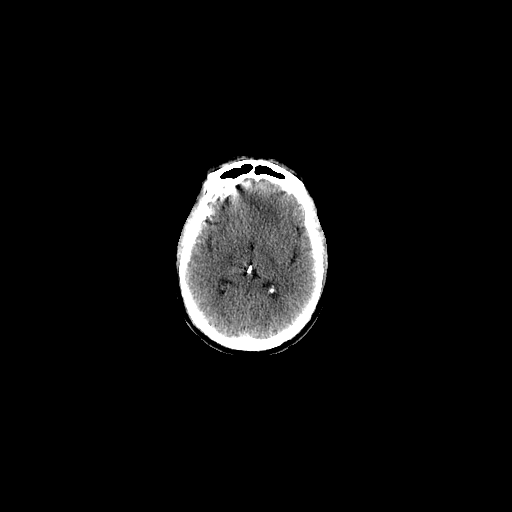

[2 of 25 positions shown; findings below may reference images not displayed]

FINDINGS: 1.6 x 1.4 cm rounded soft tissue lesion posterior to the
midline hyoid bone (series 2/image 41), without associated
hypermetabolism, likely reflecting a complex thyroglossal duct
cyst.

No suspicious hypermetabolic foci in the neck.  Symmetric vocal
cord uptake, likely physiologic.  No suspicious cervical
lymphadenopathy.  Mucous retention cyst in the left maxillary
sinus.

No suspicious hypermetabolic foci in the chest.  No suspicious
pulmonary nodules.  Mild aneurysmal dilatation of the descending
thoracic aorta measuring up to 4.1 cm (series 2/image 72).  No
suspicious mediastinal, hilar, or axillary lymphadenopathy.

No suspicious hypermetabolic foci in the abdomen or pelvis.
Numerous hepatic cysts.  Retroaortic left renal vein.  Normal
appendix.  No suspicious abdominopelvic lymphadenopathy.
IMPRESSION: 1.6 cm probable complex thyroglossal duct cyst, without associated
hypermetabolism.

No suspicious hypermetabolic foci in the neck, chest, abdomen, or
pelvis.

## 2013-11-13 ENCOUNTER — Emergency Department (HOSPITAL_COMMUNITY)
Admission: EM | Admit: 2013-11-13 | Discharge: 2013-11-13 | Disposition: A | Discharge status: Home / Self Care | Payer: No Typology Code available for payment source | Attending: Emergency Medicine | Admitting: Emergency Medicine

## 2013-11-13 ENCOUNTER — Encounter (HOSPITAL_COMMUNITY): Payer: Self-pay | Admitting: Emergency Medicine

## 2013-11-13 DIAGNOSIS — S0993XA Unspecified injury of face, initial encounter: Secondary | ICD-10-CM | POA: Insufficient documentation

## 2013-11-13 DIAGNOSIS — Y9389 Activity, other specified: Secondary | ICD-10-CM | POA: Insufficient documentation

## 2013-11-13 DIAGNOSIS — F259 Schizoaffective disorder, unspecified: Secondary | ICD-10-CM | POA: Insufficient documentation

## 2013-11-13 DIAGNOSIS — Y9289 Other specified places as the place of occurrence of the external cause: Secondary | ICD-10-CM | POA: Insufficient documentation

## 2013-11-13 DIAGNOSIS — Z8719 Personal history of other diseases of the digestive system: Secondary | ICD-10-CM | POA: Insufficient documentation

## 2013-11-13 DIAGNOSIS — IMO0002 Reserved for concepts with insufficient information to code with codable children: Secondary | ICD-10-CM | POA: Insufficient documentation

## 2013-11-13 DIAGNOSIS — Z79899 Other long term (current) drug therapy: Secondary | ICD-10-CM | POA: Insufficient documentation

## 2013-11-13 DIAGNOSIS — S199XXA Unspecified injury of neck, initial encounter: Secondary | ICD-10-CM

## 2013-11-13 DIAGNOSIS — H40009 Preglaucoma, unspecified, unspecified eye: Secondary | ICD-10-CM | POA: Insufficient documentation

## 2013-11-13 DIAGNOSIS — I10 Essential (primary) hypertension: Secondary | ICD-10-CM | POA: Diagnosis not present

## 2013-11-13 DIAGNOSIS — M542 Cervicalgia: Secondary | ICD-10-CM

## 2013-11-13 DIAGNOSIS — M545 Low back pain: Secondary | ICD-10-CM

## 2013-11-13 MED ORDER — TRAMADOL HCL 50 MG PO TABS: 50.0000 mg | ORAL_TABLET | Freq: Four times a day (QID) | ORAL | Status: DC | PRN

## 2013-11-13 MED ORDER — CYCLOBENZAPRINE HCL 10 MG PO TABS: 10.0000 mg | ORAL_TABLET | Freq: Every day | ORAL | Status: DC

## 2013-11-13 MED ORDER — IBUPROFEN 800 MG PO TABS
800.0000 mg | ORAL_TABLET | Freq: Three times a day (TID) | ORAL | Status: DC
Start: 2013-11-13 — End: 2021-05-16
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
## 2013-11-13 NOTE — Discharge Instructions (Signed)
Call for a follow up appointment with a Family or Primary Care Provider.  Return if Symptoms worsen.   Take medication as prescribed.  Ice your neck and back 3-4 times a day. Do not operate heavy machinery or drink alcohol taking Flexeril and or Ultram.
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
## 2013-11-14 NOTE — ED Provider Notes (Signed)
Medical screening examination/treatment/procedure(s) were performed by non-physician practitioner and as supervising physician I was immediately available for consultation/collaboration.   EKG Interpretation None        Orpah Greek, MD 11/14/13 [phone]

## 2014-02-02 ENCOUNTER — Emergency Department (HOSPITAL_COMMUNITY)
Admission: EM | Admit: 2014-02-02 | Discharge: 2014-02-02 | Disposition: A | Discharge status: Home / Self Care | Payer: No Typology Code available for payment source | Attending: Emergency Medicine | Admitting: Emergency Medicine

## 2014-02-02 ENCOUNTER — Encounter (HOSPITAL_COMMUNITY): Payer: Self-pay | Admitting: Emergency Medicine

## 2014-02-02 DIAGNOSIS — Z8719 Personal history of other diseases of the digestive system: Secondary | ICD-10-CM | POA: Insufficient documentation

## 2014-02-02 DIAGNOSIS — M546 Pain in thoracic spine: Secondary | ICD-10-CM | POA: Insufficient documentation

## 2014-02-02 DIAGNOSIS — H409 Unspecified glaucoma: Secondary | ICD-10-CM | POA: Insufficient documentation

## 2014-02-02 DIAGNOSIS — G8911 Acute pain due to trauma: Secondary | ICD-10-CM | POA: Insufficient documentation

## 2014-02-02 DIAGNOSIS — M545 Low back pain: Secondary | ICD-10-CM | POA: Insufficient documentation

## 2014-02-02 DIAGNOSIS — M5489 Other dorsalgia: Secondary | ICD-10-CM

## 2014-02-02 DIAGNOSIS — Z79899 Other long term (current) drug therapy: Secondary | ICD-10-CM | POA: Diagnosis not present

## 2014-02-02 DIAGNOSIS — F259 Schizoaffective disorder, unspecified: Secondary | ICD-10-CM | POA: Insufficient documentation

## 2014-02-02 DIAGNOSIS — I10 Essential (primary) hypertension: Secondary | ICD-10-CM | POA: Insufficient documentation

## 2014-02-02 DIAGNOSIS — M549 Dorsalgia, unspecified: Secondary | ICD-10-CM | POA: Diagnosis present

## 2014-02-02 DIAGNOSIS — M79604 Pain in right leg: Secondary | ICD-10-CM | POA: Insufficient documentation

## 2014-02-02 DIAGNOSIS — Z791 Long term (current) use of non-steroidal anti-inflammatories (NSAID): Secondary | ICD-10-CM | POA: Diagnosis not present

## 2014-02-02 DIAGNOSIS — M542 Cervicalgia: Secondary | ICD-10-CM | POA: Diagnosis not present

## 2014-02-02 MED ORDER — CYCLOBENZAPRINE HCL 10 MG PO TABS: 10.0000 mg | ORAL_TABLET | Freq: Three times a day (TID) | ORAL | Status: DC | PRN

## 2014-02-02 NOTE — Discharge Instructions (Signed)
Read the information below.  Use the prescribed medication as directed.  Please discuss all new medications with your pharmacist.  You may return to the Emergency Department at any time for worsening condition or any new symptoms that concern you.   If you develop fevers, loss of control of bowel or bladder, weakness or numbness in your legs, or are unable to walk, return to the ER for a recheck.    Back Pain, Adult Low back pain is very common. About 1 in 5 people have back pain.The cause of low back pain is rarely dangerous. The pain often gets better over time.About half of people with a sudden onset of back pain feel better in just 2 weeks. About 8 in 10 people feel better by 6 weeks.  CAUSES Some common causes of back pain include:  Strain of the muscles or ligaments supporting the spine.  Wear and tear (degeneration) of the spinal discs.  Arthritis.  Direct injury to the back. DIAGNOSIS Most of the time, the direct cause of low back pain is not known.However, back pain can be treated effectively even when the exact cause of the pain is unknown.Answering your caregiver's questions about your overall health and symptoms is one of the most accurate ways to make sure the cause of your pain is not dangerous. If your caregiver needs more information, he or she may order lab work or imaging tests (X-rays or MRIs).However, even if imaging tests show changes in your back, this usually does not require surgery. HOME CARE INSTRUCTIONS For many people, back pain returns.Since low back pain is rarely dangerous, it is often a condition that people can learn to [facility] their own.   Remain active. It is stressful on the back to sit or stand in one place. Do not sit, drive, or stand in one place for more than 30 minutes at a time. Take short walks on level surfaces as soon as pain allows.Try to increase the length of time you walk each day.  Do not stay in bed.Resting more than 1 or 2 days can  delay your recovery.  Do not avoid exercise or work.Your body is made to move.It is not dangerous to be active, even though your back may hurt.Your back will likely heal faster if you return to being active before your pain is gone.  Pay attention to your body when you bend and lift. Many people have less discomfortwhen lifting if they bend their knees, keep the load close to their bodies,and avoid twisting. Often, the most comfortable positions are those that put less stress on your recovering back.  Find a comfortable position to sleep. Use a firm mattress and lie on your side with your knees slightly bent. If you lie on your back, put a pillow under your knees.  Only take over-the-counter or prescription medicines as directed by your caregiver. Over-the-counter medicines to reduce pain and inflammation are often the most helpful.Your caregiver may prescribe muscle relaxant drugs.These medicines help dull your pain so you can more quickly return to your normal activities and healthy exercise.  Put ice on the injured area.  Put ice in a plastic bag.  Place a towel between your skin and the bag.  Leave the ice on for 15-20 minutes, 03-04 times a day for the first 2 to 3 days. After that, ice and heat may be alternated to reduce pain and spasms.  Ask your caregiver about trying back exercises and gentle massage. This may be of some  benefit.  Avoid feeling anxious or stressed.Stress increases muscle tension and can worsen back pain.It is important to recognize when you are anxious or stressed and learn ways to manage it.Exercise is a great option. SEEK MEDICAL CARE IF:  You have pain that is not relieved with rest or medicine.  You have pain that does not improve in 1 week.  You have new symptoms.  You are generally not feeling well. SEEK IMMEDIATE MEDICAL CARE IF:   You have pain that radiates from your back into your legs.  You develop new bowel or bladder control  problems.  You have unusual weakness or numbness in your arms or legs.  You develop nausea or vomiting.  You develop abdominal pain.  You feel faint. Document Released: 03/23/2005 Document Revised: 09/22/2011 Document Reviewed: 07/25/2013 [facility] Patient Information 2015 [place], [place]. This information is not intended to replace advice given to you by your health care provider. Make sure you discuss any questions you have with your health care provider.    Back Exercises Back exercises help treat and prevent back injuries. The goal is to increase your strength in your belly (abdominal) and back muscles. These exercises can also help with flexibility. Start these exercises when told by your doctor. HOME CARE Back exercises include: Pelvic Tilt.  Lie on your back with your knees bent. Tilt your pelvis until the lower part of your back is against the floor. Hold this position 5 to 10 sec. Repeat this exercise 5 to 10 times. Knee to Chest.  Pull 1 knee up against your chest and hold for 20 to 30 seconds. Repeat this with the other knee. This may be done with the other leg straight or bent, whichever feels better. Then, pull both knees up against your chest. Sit-Ups or Curl-Ups.  Bend your knees 90 degrees. Start with tilting your pelvis, and do a partial, slow sit-up. Only lift your upper half 30 to 45 degrees off the floor. Take at least 2 to 3 seonds for each sit-up. Do not do sit-ups with your knees out straight. If partial sit-ups are difficult, simply do the above but with only tightening your belly (abdominal) muscles and holding it as told. Hip-Lift.  Lie on your back with your knees flexed 90 degrees. Push down with your feet and shoulders as you raise your hips 2 inches off the floor. Hold for 10 seconds, repeat 5 to 10 times. Back Arches.  Lie on your stomach. Prop yourself up on bent elbows. Slowly press on your hands, causing an arch in your low back. Repeat 3 to 5  times. Shoulder-Lifts.  Lie face down with arms beside your body. Keep hips and belly pressed to floor as you slowly lift your head and shoulders off the floor. Do not overdo your exercises. Be careful in the beginning. Exercises may cause you some mild back discomfort. If the pain lasts for more than 15 minutes, stop the exercises until you see your doctor. Improvement with exercise for back problems is slow.  Document Released: 04/25/2010 Document Revised: 06/15/2011 Document Reviewed: 01/22/2011 [facility] Patient Information 2015 [place], [place]. This information is not intended to replace advice given to you by your health care provider. Make sure you discuss any questions you have with your health care provider.

## 2014-02-02 NOTE — ED Provider Notes (Signed)
Medical screening examination/treatment/procedure(s) were performed by non-physician practitioner and as supervising physician I was immediately available for consultation/collaboration.   EKG Interpretation None        [place], DO 02/02/14 2319
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
## 2020-05-15 ENCOUNTER — Telehealth: Payer: Self-pay | Admitting: *Deleted

## 2020-05-15 NOTE — Telephone Encounter (Signed)
Patient called and said that he was asked reason for visit when called to confirm appointment on Friday, meant to let her know that he is wanting his toenails clipped as well.

## 2020-05-17 ENCOUNTER — Other Ambulatory Visit: Payer: Self-pay

## 2020-05-17 ENCOUNTER — Ambulatory Visit (INDEPENDENT_AMBULATORY_CARE_PROVIDER_SITE_OTHER): Payer: Medicaid Other | Admitting: Podiatry

## 2020-05-17 DIAGNOSIS — M79675 Pain in left toe(s): Secondary | ICD-10-CM

## 2020-05-17 DIAGNOSIS — B351 Tinea unguium: Secondary | ICD-10-CM

## 2020-05-17 DIAGNOSIS — E119 Type 2 diabetes mellitus without complications: Secondary | ICD-10-CM

## 2020-05-17 DIAGNOSIS — M79674 Pain in right toe(s): Secondary | ICD-10-CM

## 2020-05-17 DIAGNOSIS — Z794 Long term (current) use of insulin: Secondary | ICD-10-CM

## 2020-05-21 ENCOUNTER — Encounter: Payer: Self-pay | Admitting: Podiatry

## 2020-05-21 NOTE — Progress Notes (Signed)
  Subjective:  Patient ID: Jimmy Marshall, male    DOB: 22-Jun-1955,  MRN: [id number]  Chief Complaint  Patient presents with  . Diabetes    PT stated that he is pre diabetic and his DR sent him here for a foot exam and nail trim    65 y.o. male returns for the above complaint.  Patient presents with thickened elongated dystrophic toenails x10.  Painful to touch.  He states he was recently diagnosed by his primary care physician that is a diabetic.  He does not know his A1c he denies any other acute complaints.  He has not seen anyone else prior to seeing me  Objective:  There were no vitals filed for this visit. Podiatric Exam: Vascular: dorsalis pedis and posterior tibial pulses are palpable bilateral. Capillary return is immediate. Temperature gradient is WNL. Skin turgor WNL  Sensorium: Normal Semmes Weinstein monofilament test. Normal tactile sensation bilaterally. Nail Exam: Pt has thick disfigured discolored nails with subungual debris noted bilateral entire nail hallux through fifth toenails.  Pain on palpation to the nails. Ulcer Exam: There is no evidence of ulcer or pre-ulcerative changes or infection. Orthopedic Exam: Muscle tone and strength are WNL. No limitations in general ROM. No crepitus or effusions noted. HAV  B/L.  Hammer toes 2-5  B/L. Skin: No Porokeratosis. No infection or ulcers    Assessment & Plan:   1. Pain due to onychomycosis of toenails of both feet   2. Controlled type 2 diabetes mellitus without complication, with long-term current use of insulin ([place])     Patient was evaluated and treated and all questions answered.  Onychomycosis with pain  -Nails palliatively debrided as below. -Educated on self-care  Procedure: Nail Debridement Rationale: pain  Type of Debridement: manual, sharp debridement. Instrumentation: Nail nipper, rotary burr. Number of Nails: 10  Procedures and Treatment: Consent by patient was obtained for treatment procedures. The  patient understood the discussion of treatment and procedures well. All questions were answered thoroughly reviewed. Debridement of mycotic and hypertrophic toenails, 1 through 5 bilateral and clearing of subungual debris. No ulceration, no infection noted.  Return Visit-Office Procedure: Patient instructed to return to the office for a follow up visit 3 months for continued evaluation and treatment.  Jimmy Marshall, DPM    No follow-ups on file.

## 2020-06-17 ENCOUNTER — Encounter: Payer: Self-pay | Admitting: Dietician

## 2020-06-17 ENCOUNTER — Other Ambulatory Visit: Payer: Self-pay

## 2020-06-17 ENCOUNTER — Encounter: Payer: Medicaid Other | Attending: Internal Medicine | Admitting: Dietician

## 2020-06-17 DIAGNOSIS — E119 Type 2 diabetes mellitus without complications: Secondary | ICD-10-CM | POA: Insufficient documentation

## 2020-06-17 NOTE — Patient Instructions (Signed)
Increase vegetables in your diet when you are able. Great job on the changes that you have made!  Choosing unsweetened beverages (water)  Walking most days  Taking your medication consistency  Decreasing your sugar and chips  Plan:  Aim for 3-4 Carb Choices per meal (45-60 grams) +/- 1 either way  Aim for 0-1 Carbs per snack if hungry  Include protein in moderation with your meals and snacks Consider reading food labels for Total Carbohydrate of foods Continue  increasing your activity level by walking for 30 minutes daily as tolerated Continue checking BG at alternate times per day  Continue taking medication as directed by MD

## 2020-06-17 NOTE — Progress Notes (Signed)
Diabetes Self-Management Education  Visit Type: First/Initial  Appt. Start Time: 1400 Appt. End Time: 6948  06/17/2020  [name], identified by name and date of birth, is a 65 y.o. male with a diagnosis of Diabetes: Type 2.   ASSESSMENT Patient is here today alone. He states that he used to eat a lot of sugar.  He has stopped this. He has stopped coolade. Walking 30 minutes 5 days per week.  History includes newly diagnosed type 2 diabetes, HTN, GERD, depression, Schizophrenia, vitamin D deficiency He has food insecurity and goes to the food pantry at times. Labs noted to include:  A1C > 14% 05/07/2020, cholesterol 126, HDL 32, LDL 71, Triglycerides 152, eGFR 93 05/2020 Medications include:  MVI, vitamin C, Metformin, Lantus 10 units q HS, remeron, zyprexa  Weight hx: 202 lbs 06/17/2020 200-206 lbs UBW  Patient lives alone.  He is on disability.  Got out of prison 03/2021.  Used to eat a lot of chips there and did not eat meals well except for vegetables. Rarely eats meat due to the expense.   Cooks his own meals (simple foods), eats out occasionally.    Height _0  (1.803 m), weight 202 lb (91.6 kg). Body mass index is 28.17 kg/m.   Diabetes Self-Management Education - 06/17/20 1418      Visit Information   Visit Type First/Initial      Initial Visit   Diabetes Type Type 2    Are you taking your medications as prescribed? Yes    Date Diagnosed 05/2020      Health Coping   How would you rate your overall health? Good      Psychosocial Assessment   Patient Belief/Attitude about Diabetes Motivated to manage diabetes    Self-care barriers None;Lack of material resources    Self-management support Doctor's office    Other persons present Patient    Special Needs None    Preferred Learning Style No preference indicated    Learning Readiness Ready    How often do you need to have someone help you when you read instructions, pamphlets, or other written  materials from your doctor or pharmacy? 1 - Never    What is the last grade level you completed in school? 12      Pre-Education Assessment   Patient understands the diabetes disease and treatment process. Needs Instruction    Patient understands incorporating nutritional management into lifestyle. Needs Instruction    Patient undertands incorporating physical activity into lifestyle. Needs Instruction    Patient understands using medications safely. Needs Instruction    Patient understands monitoring blood glucose, interpreting and using results Needs Instruction    Patient understands prevention, detection, and treatment of acute complications. Needs Instruction    Patient understands prevention, detection, and treatment of chronic complications. Needs Instruction    Patient understands how to develop strategies to address psychosocial issues. Needs Instruction    Patient understands how to develop strategies to promote health/change behavior. Needs Instruction      Complications   Last HgB A1C per patient/outside source 14 %   >14 05/2020   How often do you check your blood sugar? 1-2 times/day    Fasting Blood glucose range (mg/dL) 70-129    Postprandial Blood glucose range (mg/dL) 70-129    Number of hypoglycemic episodes per month 0    Number of hyperglycemic episodes per week 0    Have you had a dilated eye exam in the past 12 months? No  appointment next week   Have you had a dental exam in the past 12 months? No    Are you checking your feet? No      Dietary Intake   Breakfast 3 eggs with cheese, 8 oz buttermilk or whole milk, 8-9 grapes OR ramen noodles OR whole grain cereal, whole milk, 8-9 grapes   8 am   Snack (morning) none    Lunch canned beans, franks OR chicken sandwich (fast food) OR cucumbers, salad with french dressing, sweet pickles, banana,   12   Snack (afternoon) grapes    Dinner baked beans, collard greens, potato salad, strawberries and whipped cream or  watermelon (1#) OR occasionally pepperoni pizza (1/2)   6:30   Snack (evening) none    Beverage(s) water, buttermilk, whole milk      Exercise   Exercise Type Light (walking / raking leaves)    How many days per week to you exercise? 5    How many minutes per day do you exercise? 30    Total minutes per week of exercise 150      Patient Education   Previous Diabetes Education No    Disease state  Definition of diabetes, type 1 and 2, and the diagnosis of diabetes    Nutrition management  Food label reading, portion sizes and measuring food.;Role of diet in the treatment of diabetes and the relationship between the three main macronutrients and blood glucose level;Meal options for control of blood glucose level and chronic complications.    Physical activity and exercise  Role of exercise on diabetes management, blood pressure control and cardiac health.    Medications Reviewed patients medication for diabetes, action, purpose, timing of dose and side effects.;Taught/reviewed insulin injection, site rotation, insulin storage and needle disposal.    Monitoring Purpose and frequency of SMBG.;Identified appropriate SMBG and/or A1C goals.;Taught/discussed recording of test results and interpretation of SMBG.;Daily foot exams;Yearly dilated eye exam    Acute complications Taught treatment of hypoglycemia - the 15 rule.    Chronic complications Relationship between chronic complications and blood glucose control      Individualized Goals (developed by patient)   Nutrition General guidelines for healthy choices and portions discussed    Physical Activity Exercise 5-7 days per week;30 minutes per day    Medications take my medication as prescribed    Monitoring  test my blood glucose as discussed    Reducing Risk examine blood glucose patterns;increase portions of healthy fats      Post-Education Assessment   Patient understands the diabetes disease and treatment process. Demonstrates  understanding / competency    Patient understands incorporating nutritional management into lifestyle. Needs Review    Patient undertands incorporating physical activity into lifestyle. Demonstrates understanding / competency    Patient understands using medications safely. Demonstrates understanding / competency    Patient understands monitoring blood glucose, interpreting and using results Demonstrates understanding / competency    Patient understands prevention, detection, and treatment of acute complications. Demonstrates understanding / competency    Patient understands prevention, detection, and treatment of chronic complications. Demonstrates understanding / competency    Patient understands how to develop strategies to address psychosocial issues. Demonstrates understanding / competency    Patient understands how to develop strategies to promote health/change behavior. Needs Review      Outcomes   Expected Outcomes Demonstrated interest in learning. Expect positive outcomes    Future DMSE 4-6 wks    Program Status Not Completed  Individualized Plan for Diabetes Self-Management Training:   Learning Objective:  Patient will have a greater understanding of diabetes self-management. Patient education plan is to attend individual and/or group sessions per assessed needs and concerns.   Plan:   Patient Instructions  Increase vegetables in your diet when you are able. Great job on the changes that you have made!  Choosing unsweetened beverages (water)  Walking most days  Taking your medication consistency  Decreasing your sugar and chips  Plan:  Aim for 3-4 Carb Choices per meal (45-60 grams) +/- 1 either way  Aim for 0-1 Carbs per snack if hungry  Include protein in moderation with your meals and snacks Consider reading food labels for Total Carbohydrate of foods Continue  increasing your activity level by walking for 30 minutes daily as tolerated Continue checking  BG at alternate times per day  Continue taking medication as directed by MD      Expected Outcomes:  Demonstrated interest in learning. Expect positive outcomes  Education material provided: ADA - How to Thrive: A Guide for Your Journey with Diabetes, Food label handouts, Meal plan card and Snack sheet  If problems or questions, patient to contact team via:  Phone  Future DSME appointment: 4-6 wks

## 2020-07-15 ENCOUNTER — Encounter: Payer: Self-pay | Admitting: Dietician

## 2020-07-15 ENCOUNTER — Other Ambulatory Visit: Payer: Self-pay

## 2020-07-15 ENCOUNTER — Encounter: Payer: Medicaid Other | Attending: Internal Medicine | Admitting: Dietician

## 2020-07-15 DIAGNOSIS — E119 Type 2 diabetes mellitus without complications: Secondary | ICD-10-CM | POA: Insufficient documentation

## 2020-07-15 NOTE — Progress Notes (Signed)
Diabetes Self-Management Education  Visit Type:  Follow-up  Appt. Start Time: 1615 Appt. End Time: 6063  07/16/2020  [name], identified by name and date of birth, is a 65 y.o. male with a diagnosis of Diabetes:  .    ASSESSMENT Patient is here today alone.  He was last seen by this RD 06/17/2020.  Since last visit he cut out rice, candy, watermelon, whole milk (changed to 2%), and stopped added sugar. He previously cut out coolade, and regular soda. He drinks 20 oz cranberry juice per day. Continues to go on a brisk walk 5 times per week for 20-30 minutes. Checking his blood glucose - 90 fasting and highest has been 120 at HS.  History includes newly diagnosed type 2 diabetes, HTN, GERD, depression, Schizophrenia, vitamin D deficiency He has food insecurity and goes to the food pantry at times. Labs noted to include:  A1C > 14% 05/07/2020, cholesterol 126, HDL 32, LDL 71, Triglycerides 152, eGFR 93 05/2020 Medications include:  MVI, vitamin C, Metformin, Lantus 10 units q HS, remeron, zyprexa  Weight hx: 200 lbs 07/15/2020  202 lbs 06/17/2020 200-206 lbs UBW  Patient lives alone.  He is on disability.  Got out of prison 03/2021.  Used to eat a lot of chips there and did not eat meals well except for vegetables. Rarely eats meat due to the expense.   Cooks his own meals (simple foods), eats out occasionally.    Weight 200 lb (90.7 kg). Body mass index is 27.89 kg/m.    Diabetes Self-Management Education - 07/16/20 0925      Psychosocial Assessment   Patient Belief/Attitude about Diabetes Motivated to manage diabetes    Self-care barriers Lack of material resources    Learning Readiness Change in progress      Pre-Education Assessment   Patient understands the diabetes disease and treatment process. Needs Review    Patient understands incorporating nutritional management into lifestyle. Needs Review    Patient undertands incorporating physical activity into  lifestyle. Needs Review    Patient understands using medications safely. Needs Review    Patient understands monitoring blood glucose, interpreting and using results Needs Review    Patient understands prevention, detection, and treatment of acute complications. Needs Review    Patient understands prevention, detection, and treatment of chronic complications. Needs Review    Patient understands how to develop strategies to address psychosocial issues. Needs Review    Patient understands how to develop strategies to promote health/change behavior. Needs Review      Complications   How often do you check your blood sugar? 1-2 times/day    Fasting Blood glucose range (mg/dL) 70-129    Postprandial Blood glucose range (mg/dL) 70-129    Number of hypoglycemic episodes per month 0    Number of hyperglycemic episodes per week 0      Dietary Intake   Breakfast Ramen or oatmeal or cheese, eggs, grits or cereal with 2% milk and banana    Lunch tuna or hot dogs, slaw or bologna sandwich, grapes/strawberries, peas/beets or pizza    Dinner Dinty Moore Beef Stew or spaghetti or salmon, baked potato, vegetables OR pinto or lima beans and vegetables OR fried chicken, salad    Snack (evening) none    Beverage(s) water, cranberry juice, OJ, 2% milk      Exercise   Exercise Type Light (walking / raking leaves)    How many days per week to you exercise? 5  How many minutes per day do you exercise? 30    Total minutes per week of exercise 150      Patient Education   Previous Diabetes Education Yes (please comment)   05/2020   Nutrition management  Meal options for control of blood glucose level and chronic complications.    Medications Reviewed patients medication for diabetes, action, purpose, timing of dose and side effects.    Monitoring Identified appropriate SMBG and/or A1C goals.      Individualized Goals (developed by patient)   Nutrition General guidelines for healthy choices and portions  discussed    Physical Activity Exercise 5-7 days per week;30 minutes per day    Medications take my medication as prescribed    Monitoring  test my blood glucose as discussed    Reducing Risk examine blood glucose patterns;increase portions of healthy fats;do foot checks daily    Health Coping discuss diabetes with (comment)   MD, RD, CDCES     Patient Self-Evaluation of Goals - Patient rates self as meeting previously set goals (% of time)   Nutrition >75%    Physical Activity >75%    Medications >75%    Monitoring >75%    Problem Solving >75%    Reducing Risk >75%    Health Coping >75%      Post-Education Assessment   Patient understands the diabetes disease and treatment process. Demonstrates understanding / competency    Patient understands incorporating nutritional management into lifestyle. Needs Review    Patient undertands incorporating physical activity into lifestyle. Demonstrates understanding / competency    Patient understands using medications safely. Demonstrates understanding / competency    Patient understands monitoring blood glucose, interpreting and using results Demonstrates understanding / competency    Patient understands prevention, detection, and treatment of acute complications. Demonstrates understanding / competency    Patient understands prevention, detection, and treatment of chronic complications. Demonstrates understanding / competency    Patient understands how to develop strategies to address psychosocial issues. Demonstrates understanding / competency    Patient understands how to develop strategies to promote health/change behavior. Needs Review      Outcomes   Program Status Completed      Subsequent Visit   Since your last visit have you continued or begun to take your medications as prescribed? Yes    Since your last visit have you experienced any weight changes? Loss    Weight Loss (lbs) 2    Since your last visit, are you checking your blood  glucose at least once a day? Yes           Learning Objective:  Patient will have a greater understanding of diabetes self-management. Patient education plan is to attend individual and/or group sessions per assessed needs and concerns.   Plan:   Patient Instructions  Doristine Devoid job on the changes that you have made!  Continue these changes. Continue your brisk waking.  Consider decreasing the portion size of juice.  Juice increases blood sugar. Bake rather than fry most often  (Rotisorie chicken would be a better option than Banquet) Continue to increase your vegetables as able.    Expected Outcomes:  Demonstrated interest in learning. Expect positive outcomes  Education material provided:   If problems or questions, patient to contact team via:  Phone  Future DSME appointment: - 3-4 months

## 2020-07-15 NOTE — Patient Instructions (Addendum)
Great job on the changes that you have made!  Continue these changes. Continue your brisk waking.  Consider decreasing the portion size of juice.  Juice increases blood sugar. Bake rather than fry most often  ([employer] chicken would be a better option than Banquet) Continue to increase your vegetables as able.

## 2020-11-15 ENCOUNTER — Ambulatory Visit: Payer: Medicaid Other | Admitting: Podiatry

## 2021-05-12 ENCOUNTER — Emergency Department (HOSPITAL_COMMUNITY): Payer: Medicaid Other

## 2021-05-12 ENCOUNTER — Other Ambulatory Visit: Payer: Self-pay

## 2021-05-12 ENCOUNTER — Inpatient Hospital Stay (HOSPITAL_COMMUNITY)
Admission: EM | Admit: 2021-05-12 | Discharge: 2021-05-16 | DRG: 300 | Disposition: A | Discharge status: Home / Self Care | Payer: Medicaid Other | Attending: Internal Medicine | Admitting: Internal Medicine

## 2021-05-12 ENCOUNTER — Encounter (HOSPITAL_COMMUNITY): Payer: Self-pay

## 2021-05-12 DIAGNOSIS — R066 Hiccough: Secondary | ICD-10-CM | POA: Diagnosis present

## 2021-05-12 DIAGNOSIS — I10 Essential (primary) hypertension: Secondary | ICD-10-CM | POA: Diagnosis present

## 2021-05-12 DIAGNOSIS — F251 Schizoaffective disorder, depressive type: Secondary | ICD-10-CM | POA: Diagnosis present

## 2021-05-12 DIAGNOSIS — F1290 Cannabis use, unspecified, uncomplicated: Secondary | ICD-10-CM | POA: Diagnosis present

## 2021-05-12 DIAGNOSIS — K7689 Other specified diseases of liver: Secondary | ICD-10-CM | POA: Diagnosis present

## 2021-05-12 DIAGNOSIS — G4700 Insomnia, unspecified: Secondary | ICD-10-CM | POA: Diagnosis present

## 2021-05-12 DIAGNOSIS — I7103 Dissection of thoracoabdominal aorta: Principal | ICD-10-CM | POA: Diagnosis present

## 2021-05-12 DIAGNOSIS — K219 Gastro-esophageal reflux disease without esophagitis: Secondary | ICD-10-CM | POA: Diagnosis present

## 2021-05-12 DIAGNOSIS — F1490 Cocaine use, unspecified, uncomplicated: Secondary | ICD-10-CM | POA: Diagnosis present

## 2021-05-12 DIAGNOSIS — R112 Nausea with vomiting, unspecified: Secondary | ICD-10-CM | POA: Diagnosis present

## 2021-05-12 DIAGNOSIS — I161 Hypertensive emergency: Secondary | ICD-10-CM | POA: Diagnosis present

## 2021-05-12 DIAGNOSIS — Z79899 Other long term (current) drug therapy: Secondary | ICD-10-CM | POA: Diagnosis not present

## 2021-05-12 DIAGNOSIS — I7102 Dissection of abdominal aorta: Secondary | ICD-10-CM

## 2021-05-12 DIAGNOSIS — I7100 Dissection of unspecified site of aorta: Secondary | ICD-10-CM | POA: Diagnosis present

## 2021-05-12 DIAGNOSIS — E119 Type 2 diabetes mellitus without complications: Secondary | ICD-10-CM | POA: Diagnosis present

## 2021-05-12 DIAGNOSIS — Z7984 Long term (current) use of oral hypoglycemic drugs: Secondary | ICD-10-CM | POA: Diagnosis not present

## 2021-05-12 DIAGNOSIS — Z20822 Contact with and (suspected) exposure to covid-19: Secondary | ICD-10-CM | POA: Diagnosis present

## 2021-05-12 DIAGNOSIS — M549 Dorsalgia, unspecified: Secondary | ICD-10-CM

## 2021-05-12 DIAGNOSIS — R079 Chest pain, unspecified: Secondary | ICD-10-CM

## 2021-05-12 LAB — CBC
HCT: 40.2 % (ref 39.0–52.0)
Hemoglobin: 12.7 g/dL — ABNORMAL LOW (ref 13.0–17.0)
MCH: 28.1 pg (ref 26.0–34.0)
MCHC: 31.6 g/dL (ref 30.0–36.0)
MCV: 88.9 fL (ref 80.0–100.0)
Platelets: 298 10*3/uL (ref 150–400)
RBC: 4.52 MIL/uL (ref 4.22–5.81)
RDW: 17.7 % — ABNORMAL HIGH (ref 11.5–15.5)
WBC: 14.2 10*3/uL — ABNORMAL HIGH (ref 4.0–10.5)
nRBC: 0 % (ref 0.0–0.2)

## 2021-05-12 LAB — RESP PANEL BY RT-PCR (FLU A&B, COVID) ARPGX2
Influenza A by PCR: NEGATIVE
Influenza B by PCR: NEGATIVE
SARS Coronavirus 2 by RT PCR: NEGATIVE

## 2021-05-12 LAB — RAPID URINE DRUG SCREEN, HOSP PERFORMED
Amphetamines: NOT DETECTED
Barbiturates: NOT DETECTED
Benzodiazepines: NOT DETECTED
Cocaine: POSITIVE — AB
Opiates: NOT DETECTED
Tetrahydrocannabinol: POSITIVE — AB

## 2021-05-12 LAB — HEPATIC FUNCTION PANEL
ALT: 73 U/L — ABNORMAL HIGH (ref 0–44)
AST: 41 U/L (ref 15–41)
Albumin: 4.2 g/dL (ref 3.5–5.0)
Alkaline Phosphatase: 50 U/L (ref 38–126)
Bilirubin, Direct: 0.3 mg/dL — ABNORMAL HIGH (ref 0.0–0.2)
Indirect Bilirubin: 0.6 mg/dL (ref 0.3–0.9)
Total Bilirubin: 0.9 mg/dL (ref 0.3–1.2)
Total Protein: 6.9 g/dL (ref 6.5–8.1)

## 2021-05-12 LAB — BASIC METABOLIC PANEL
Anion gap: 14 (ref 5–15)
BUN: 13 mg/dL (ref 8–23)
CO2: 20 mmol/L — ABNORMAL LOW (ref 22–32)
Calcium: 9.4 mg/dL (ref 8.9–10.3)
Chloride: 107 mmol/L (ref 98–111)
Creatinine, Ser: 1.14 mg/dL (ref 0.61–1.24)
GFR, Estimated: >60 mL/min (ref >60)
Glucose, Bld: 151 mg/dL — ABNORMAL HIGH (ref 70–99)
Potassium: 3.3 mmol/L — ABNORMAL LOW (ref 3.5–5.1)
Sodium: 141 mmol/L (ref 135–145)

## 2021-05-12 LAB — TROPONIN I (HIGH SENSITIVITY)
Troponin I (High Sensitivity): 14 ng/L (ref <18)
Troponin I (High Sensitivity): 12 ng/L (ref <18)

## 2021-05-12 LAB — LIPASE, BLOOD: Lipase: 35 U/L (ref 11–51)

## 2021-05-12 LAB — GLUCOSE, CAPILLARY: Glucose-Capillary: 125 mg/dL — ABNORMAL HIGH (ref 70–99)

## 2021-05-12 MED ORDER — FOLIC ACID 1 MG PO TABS: 1.0000 mg | ORAL_TABLET | Freq: Every day | ORAL | Status: DC

## 2021-05-12 MED ORDER — PANTOPRAZOLE SODIUM 40 MG PO TBEC
40.0000 mg | DELAYED_RELEASE_TABLET | Freq: Every day | ORAL | Status: DC
  Administered 2021-05-13 – 2021-05-16 (×4): 40 mg via ORAL
  Filled 2021-05-12 (×4): qty 1

## 2021-05-12 MED ORDER — ONDANSETRON HCL 4 MG/2ML IJ SOLN
4.0000 mg | Freq: Once | INTRAMUSCULAR | Status: AC
Start: 2021-05-12 — End: 2021-05-12
  Administered 2021-05-12: 4 mg via INTRAVENOUS
  Filled 2021-05-12: qty 2

## 2021-05-12 MED ORDER — LORAZEPAM 2 MG/ML IJ SOLN
1.0000 mg | Freq: Once | INTRAMUSCULAR | Status: AC
  Administered 2021-05-12: 1 mg via INTRAVENOUS
  Filled 2021-05-12: qty 1

## 2021-05-12 MED ORDER — INSULIN ASPART 100 UNIT/ML IJ SOLN
0.0000 [IU] | INTRAMUSCULAR | Status: DC
  Administered 2021-05-13: 1 [IU] via SUBCUTANEOUS

## 2021-05-12 MED ORDER — THIAMINE HCL 100 MG/ML IJ SOLN
100.0000 mg | Freq: Every day | INTRAMUSCULAR | Status: DC
  Administered 2021-05-12: 100 mg via INTRAVENOUS
  Filled 2021-05-12: qty 2

## 2021-05-12 MED ORDER — POLYETHYLENE GLYCOL 3350 17 G PO PACK: 17.0000 g | PACK | Freq: Every day | ORAL | Status: DC | PRN

## 2021-05-12 MED ORDER — LOSARTAN POTASSIUM 50 MG PO TABS
50.0000 mg | ORAL_TABLET | Freq: Every day | ORAL | Status: DC
  Administered 2021-05-13 – 2021-05-16 (×5): 50 mg via ORAL
  Filled 2021-05-12 (×5): qty 1

## 2021-05-12 MED ORDER — ASPIRIN 81 MG PO CHEW
324.0000 mg | CHEWABLE_TABLET | Freq: Once | ORAL | Status: AC
  Administered 2021-05-12: 324 mg via ORAL
  Filled 2021-05-12: qty 4

## 2021-05-12 MED ORDER — AMLODIPINE BESYLATE 10 MG PO TABS
10.0000 mg | ORAL_TABLET | Freq: Every day | ORAL | Status: DC
  Administered 2021-05-13 – 2021-05-16 (×5): 10 mg via ORAL
  Filled 2021-05-12 (×5): qty 1

## 2021-05-12 MED ORDER — FENTANYL CITRATE PF 50 MCG/ML IJ SOSY
50.0000 ug | PREFILLED_SYRINGE | Freq: Once | INTRAMUSCULAR | Status: AC
  Administered 2021-05-12: 50 ug via INTRAVENOUS
  Filled 2021-05-12: qty 1

## 2021-05-12 MED ORDER — DOCUSATE SODIUM 100 MG PO CAPS: 100.0000 mg | ORAL_CAPSULE | Freq: Two times a day (BID) | ORAL | Status: DC | PRN

## 2021-05-12 MED ORDER — ENOXAPARIN SODIUM 40 MG/0.4ML IJ SOSY
40.0000 mg | PREFILLED_SYRINGE | INTRAMUSCULAR | Status: DC
  Administered 2021-05-12 – 2021-05-15 (×4): 40 mg via SUBCUTANEOUS
  Filled 2021-05-12 (×4): qty 0.4

## 2021-05-12 MED ORDER — FENTANYL CITRATE PF 50 MCG/ML IJ SOSY: 50.0000 ug | PREFILLED_SYRINGE | INTRAMUSCULAR | Status: DC | PRN

## 2021-05-12 MED ORDER — LORAZEPAM 1 MG PO TABS: 1.0000 mg | ORAL_TABLET | ORAL | Status: DC | PRN

## 2021-05-12 MED ORDER — LORAZEPAM 2 MG/ML IJ SOLN
1.0000 mg | INTRAMUSCULAR | Status: DC | PRN
  Administered 2021-05-12 – 2021-05-13 (×2): 1 mg via INTRAVENOUS
  Filled 2021-05-12 (×2): qty 1

## 2021-05-12 MED ORDER — THIAMINE HCL 100 MG PO TABS: 100.0000 mg | ORAL_TABLET | Freq: Every day | ORAL | Status: DC

## 2021-05-12 MED ORDER — ONDANSETRON HCL 4 MG/2ML IJ SOLN
4.0000 mg | Freq: Four times a day (QID) | INTRAMUSCULAR | Status: DC | PRN
  Administered 2021-05-14: 4 mg via INTRAVENOUS
  Filled 2021-05-12: qty 2

## 2021-05-12 MED ORDER — ADULT MULTIVITAMIN W/MINERALS CH
1.0000 | ORAL_TABLET | Freq: Every day | ORAL | Status: DC
  Administered 2021-05-13 – 2021-05-16 (×4): 1 via ORAL
  Filled 2021-05-12 (×4): qty 1

## 2021-05-12 MED ORDER — MIRTAZAPINE 15 MG PO TABS
15.0000 mg | ORAL_TABLET | Freq: Every day | ORAL | Status: DC
  Administered 2021-05-13 – 2021-05-15 (×4): 15 mg via ORAL
  Filled 2021-05-12 (×5): qty 1

## 2021-05-12 MED ORDER — IOHEXOL 350 MG/ML SOLN
100.0000 mL | Freq: Once | INTRAVENOUS | Status: AC | PRN
  Administered 2021-05-12: 100 mL via INTRAVENOUS

## 2021-05-12 MED ORDER — NITROGLYCERIN IN D5W 200-5 MCG/ML-% IV SOLN
0.0000 ug/min | INTRAVENOUS | Status: DC
  Administered 2021-05-12 – 2021-05-13 (×2): 5 ug/min via INTRAVENOUS
  Filled 2021-05-12 (×3): qty 250

## 2021-05-12 NOTE — ED Notes (Signed)
Blue and gold tube sent to lab as save tubes

## 2021-05-12 NOTE — ED Provider Notes (Signed)
[facility] EMERGENCY DEPARTMENT Provider Note   CSN: [id number] Arrival date & time: 05/12/21  1409     History  Chief Complaint  Patient presents with   Chest Pain    Jimmy Marshall is a 66 y.o. male.  HPI 66 year old male presents with severe chest pain.  Started about an hour prior to arrival.  He states that this morning he donated plasma.  At around 12 he took 2 Cialis tablets.  Then an hour after that or so he developed the chest pain.  He endorses a prior history of hypertension but denies a history of diabetes.  He denies any smoking.  No shortness of breath or radiation of the pain.  And is in his left anterior chest.  He has been sweaty and clammy and was brought immediately back from triage.  Home Medications Prior to Admission medications   Medication Sig Start Date End Date Taking? Authorizing Provider  amLODipine (NORVASC) 10 MG tablet Take 10 mg by mouth daily. 02/27/20  Yes [provider]  losartan (COZAAR) 50 MG tablet Take 50 mg by mouth daily.   Yes [provider]  metFORMIN (GLUCOPHAGE) 500 MG tablet Take 500 mg by mouth daily with breakfast.   Yes [provider]  mirtazapine (REMERON) 15 MG tablet Take 15 mg by mouth at bedtime. 02/27/20  Yes [provider]  Multiple Vitamin (MULTIVITAMIN WITH MINERALS) TABS tablet Take 1 tablet by mouth daily.   Yes [provider]  OLANZapine (ZYPREXA) 15 MG tablet Take 1 tablet (15 mg total) by mouth at bedtime. 07/11/12  Yes Patrecia Pour, NP  omeprazole (PRILOSEC) 20 MG capsule Take 20 mg by mouth daily.   Yes [provider]  tadalafil (CIALIS) 20 MG tablet Take 20 mg by mouth daily as needed for erectile dysfunction. 12/11/20  Yes [provider]  amLODipine (NORVASC) 5 MG tablet Take 1 tablet (5 mg total) by mouth daily. Patient not taking: Reported on 06/17/2020 07/11/12   Patrecia Pour, NP  ASPIRIN LOW DOSE 81 MG EC tablet Take 81 mg by  mouth daily. Patient not taking: Reported on 06/17/2020 02/27/20   [provider]  atenolol (TENORMIN) 25 MG tablet Take 1 tablet (25 mg total) by mouth daily. Patient not taking: Reported on 06/17/2020 07/11/12   Patrecia Pour, NP  atorvastatin (LIPITOR) 40 MG tablet Take 40 mg by mouth daily. Patient not taking: Reported on 06/17/2020 02/27/20   [provider]  cyclobenzaprine (FLEXERIL) 10 MG tablet Take 1 tablet (10 mg total) by mouth at bedtime. Patient not taking: Reported on 05/12/2021 11/13/13   Harvie Heck, PA-C  cyclobenzaprine (FLEXERIL) 10 MG tablet Take 1 tablet (10 mg total) by mouth 3 (three) times daily as needed for muscle spasms (or pain). Patient not taking: Reported on 05/12/2021 02/02/14   Clayton Bibles, PA-C  hydrOXYzine (ATARAX/VISTARIL) 50 MG tablet Take 1 tablet (50 mg total) by mouth at bedtime as needed for anxiety (sleep). Patient not taking: Reported on 05/12/2021 07/11/12   Patrecia Pour, NP  ibuprofen (ADVIL,MOTRIN) 800 MG tablet Take 1 tablet (800 mg total) by mouth 3 (three) times daily with meals. Patient not taking: Reported on 06/17/2020 11/13/13   Harvie Heck, PA-C  insulin glargine (LANTUS) 100 UNIT/ML injection 10 units Patient not taking: Reported on 05/12/2021    [provider]  metoprolol tartrate (LOPRESSOR) 25 MG tablet Take 25 mg by mouth 2 (two) times daily. Patient not taking: Reported  on 06/17/2020 02/27/20   [provider]  sildenafil (REVATIO) 20 MG tablet 1 tab(s) Patient not taking: Reported on 05/12/2021 08/02/13   [provider]  timolol (BETIMOL) 0.5 % ophthalmic solution Place 1 drop into both eyes 2 (two) times daily. Patient not taking: Reported on 05/12/2021 07/11/12   Patrecia Pour, NP  traMADol (ULTRAM) 50 MG tablet Take 1 tablet (50 mg total) by mouth every 6 (six) hours as needed. Patient not taking: Reported on 05/12/2021 11/13/13   Harvie Heck, PA-C  vitamin C (ASCORBIC ACID) 250 MG tablet Take  250 mg by mouth daily. Patient not taking: Reported on 05/12/2021    [provider]      Allergies    Patient has no known allergies.    Review of Systems   Review of Systems  Constitutional:  Negative for fever.  Respiratory:  Negative for cough and shortness of breath.   Cardiovascular:  Positive for chest pain. Negative for leg swelling.  Gastrointestinal:  Negative for abdominal pain.  Musculoskeletal:  Negative for back pain.   Physical Exam Updated Vital Signs BP (!) 164/96    Pulse (!) 49    Temp 98 F (36.7 C)    Resp 18    Ht 5\' 11"  (1.803 m)    Wt 91 kg    SpO2 93%    BMI 27.98 kg/m  Physical Exam Vitals and nursing note reviewed.  Constitutional:      General: He is in acute distress (in severe pain, at times clutching his chest).     Appearance: He is well-developed. He is diaphoretic.  HENT:     Head: Normocephalic and atraumatic.  Cardiovascular:     Rate and Rhythm: Regular rhythm. Bradycardia present.     Pulses:          Radial pulses are 2+ on the right side and 2+ on the left side.       Dorsalis pedis pulses are 2+ on the right side and 2+ on the left side.     Heart sounds: Normal heart sounds.  Pulmonary:     Effort: Pulmonary effort is normal.     Breath sounds: Normal breath sounds.  Abdominal:     Palpations: Abdomen is soft.     Tenderness: There is no abdominal tenderness.  Skin:    General: Skin is warm.  Neurological:     Mental Status: He is alert.    ED Results / Procedures / Treatments   Labs (all labs ordered are listed, but only abnormal results are displayed) Labs Reviewed  BASIC METABOLIC PANEL - Abnormal; Notable for the following components:      Result Value   Potassium 3.3 (*)    CO2 20 (*)    Glucose, Bld 151 (*)    All other components within normal limits  CBC - Abnormal; Notable for the following components:   WBC 14.2 (*)    Hemoglobin 12.7 (*)    RDW 17.7 (*)    All other components within normal limits   RAPID URINE DRUG SCREEN, HOSP PERFORMED  LIPASE, BLOOD  HEPATIC FUNCTION PANEL  TROPONIN I (HIGH SENSITIVITY)  TROPONIN I (HIGH SENSITIVITY)    EKG EKG Interpretation  Date/Time:  Monday May 12 2021 15:13:27 EST Ventricular Rate:  49 PR Interval:  194 QRS Duration: 106 QT Interval:  489 QTC Calculation: 442 R Axis:   55 Text Interpretation: Sinus or ectopic atrial bradycardia Ventricular premature complex Lead 3 ST  depressions earlier was probably related to lead placement Confirmed by Sherwood Gambler [phone]) on 05/12/2021 4:03:00 PM  Radiology DG Chest Portable 1 View  Result Date: 05/12/2021 CLINICAL DATA:  Chest pain. EXAM: PORTABLE CHEST 1 VIEW COMPARISON:  July 06, 2012. FINDINGS: Mild cardiomegaly. Both lungs are clear. The visualized skeletal structures are unremarkable. IMPRESSION: No active disease. Electronically Signed   By: Marijo Conception M.D.   On: 05/12/2021 15:01    Procedures .Critical Care Performed by: Sherwood Gambler, MD Authorized by: Sherwood Gambler, MD   Critical care provider statement:    Critical care time (minutes):  30   Critical care time was exclusive of:  Separately billable procedures and treating other patients   Critical care was necessary to treat or prevent imminent or life-threatening deterioration of the following conditions:  Cardiac failure   Critical care was time spent personally by me on the following activities:  Development of treatment plan with patient or surrogate, discussions with consultants, evaluation of patient's response to treatment, examination of patient, ordering and review of laboratory studies, ordering and review of radiographic studies, ordering and performing treatments and interventions, pulse oximetry, re-evaluation of patient's condition and review of old charts    Medications Ordered in ED Medications  aspirin chewable tablet 324 mg (324 mg Oral Given 05/12/21 1444)  fentaNYL (SUBLIMAZE) injection 50 mcg (50  mcg Intravenous Given 05/12/21 1442)  LORazepam (ATIVAN) injection 1 mg (1 mg Intravenous Given 05/12/21 1523)    ED Course/ Medical Decision Making/ A&P Clinical Course as of 05/12/21 1653  Mon May 12, 2021  1600 Patient did admit that he's been using cocaine, most recently this morning. Given this he was given IV ativan and now looks much better. Chest pain seems to be resolved, though is now complaining of abd pain. Will continue with CT dissection study and add on lfts and lipase.  [SG]    Clinical Course User Index [SG] Sherwood Gambler, MD                           Medical Decision Making Amount and/or Complexity of Data Reviewed Labs: ordered. Radiology: ordered.  Risk OTC drugs. Prescription drug management.   Patient appears ill on arrival.  Even with IV fentanyl and aspirin he still feels quite ill.  ECGs were repeated multiple times and while one time there seem to be 1 flipped T wave/ST segment, there were never any findings of STEMI or obvious acute coronary ischemia.  At this point, I discussed about cocaine use as above and thus he was given Ativan and now is feeling way better.  CT scan for dissection is pending given his uncontrolled hypertension and cocaine abuse. Labs otherwise show a normal troponin and mild hypokalemia. CXR personally viewed and shows no emergent findings on my read. Care to Dr. Langston Masker.        Final Clinical Impression(s) / ED Diagnoses Final diagnoses:  None    Rx / DC Orders ED Discharge Orders     None         Sherwood Gambler, MD 05/12/21 1654

## 2021-05-12 NOTE — ED Triage Notes (Signed)
Pt arrives POV for eval of chest pain. Pt arrives to triage clutching his chest, grey and profusely diaphoretic. Pt reports around noon he took 2 cialis, normal dose is 1. Started w/ severe chest pain at 1220 and has been constant since. Unable to get comfortable in triage.

## 2021-05-12 NOTE — ED Notes (Signed)
Pt back from CT

## 2021-05-12 NOTE — H&P (Signed)
NAME:  Jimmy Marshall, MRN:  [id number], DOB:  Feb 27, 1956, LOS: 0 ADMISSION DATE:  05/12/2021, CONSULTATION DATE:  05/12/21 REFERRING MD:  Regenia Skeeter, CHIEF COMPLAINT:  Abdominal pain, chest pain   History of Present Illness:  66 yo man with a history of here with abdominal pain and chest pain.   On Arrival, chest pain, ashen, diaphoretic.    Today donated plasma.  Took cialis x 2 today around noon.   Chest pain started at 1220.   Also took cocaine this morning.   He said he does cocaine rarely and only took "a small amount" .   He tells me his pain is mostly resolved, still at about 10 percent of what it was.  He has been feeling nauseous.  He is complaining of hunger and thirst.  Did not take his po meds today.   In ed: episode of emesis.   WBC slightly elevated from BL>  Utox pos for cocaine and THC ASA, fentanyl 50, ativan 1gm x 2 , zofran, thiamine given.   Seen by Vascular surgery.  Concern for Type B aortic dissection.    Pertinent  Medical History  GERD DM Glaucoma HTN Schizoaffective d/o.  Hx of thyroglossal cyst, s/p excision 2013  Home meds: norvasc, cozaar, metformin, remeron, mvi, zyprexa, omeprazole.  NOT TAKING:  Asa, atenolol, atorvastatin, flexeril, hydroxyzine, ibuprofen, metoprolol, timolol, ultram, vitamin c sildenafil, vitamin C  Significant Hospital Events: Including procedures, antibiotic start and stop dates in addition to other pertinent events     Interim History / Subjective:   Objective   Blood pressure (!) 130/117, pulse 93, temperature 98 F (36.7 C), resp. rate (!) 22, height 5\' 11"  (1.803 m), weight 91 kg, SpO2 92 %.        Intake/Output Summary (Last 24 hours) at 05/12/2021 2033 Last data filed at 05/12/2021 2011 Gross per 24 hour  Intake --  Output 200 ml  Net -200 ml   Filed Weights   05/12/21 1427  Weight: 91 kg    Examination: General: NAD, oriented.  Looks somewhat uncomfortable. Complaining of hunger.  HENT: NCAT Lungs:  CTAB Cardiovascular: RRR no mgr  Abdomen: nt, nd, nbs Extremities: normal pulses and perfusion  Neuro: a and o    CT Abd: IMPRESSION: 1. 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 2. The dissection is involving the upper right renal artery and the left renal artery with patency of both the true and false lumens. 3. Small amount of pericardial fluid superiorly extending to the beginning of the dissection. 4. Minimal enlargement of the ascending thoracic aorta with a maximum diameter of 4.1 cm. Recommend annual imaging followup by CTA or MRA. This recommendation follows 2010 ACCF/AHA/AATS/ACR/ASA/SCA/SCAI/SIR/STS/SVM Guidelines for the Diagnosis and Management of Patients with Thoracic Aortic Disease. Circulation. 2010; 121: Y403-K742. Aortic aneurysm NOS (ICD10-I71.9) 5. Smaller, more inferior right renal artery without dissection. 6. Mild enlargement of the central pulmonary arteries. This can be seen with pulmonary arterial hypertension. 7. Multiple liver cysts. 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  Goal systolic BP <595.  Goal HR <70.  Started on Nitro gtt by vascular surgery.   Plan to re-image  aorta in 48 hours.  No urgent surgery plan.  ICU for bp monitoring and control.   Bradycardia. 40s-50s: unclear cause.  Looks like sinus brady to me.  Denies having taken any of his medications today.    DM: ISS Hold metformin  HTN: norvasc and cozaar.   GERD: omeprazole  Insomnia: remeron qHS.   Liver cysts.  Check transaminases.   ETOH history: cont CIWA protocol.  No signs of withdrawal now on my exam.    Nausea/emesis: zofran prn.    Best Practice (right click and "Reselect all SmartList Selections" daily)   Diet/type: Regular consistency (see orders) DVT prophylaxis: LMWH GI prophylaxis: PPI Lines: N/A Foley:  N/A Code Status:  full code Last date of multidisciplinary goals of care discussion []   Labs   CBC: Recent Labs  Lab 05/12/21 1430  WBC 14.2*  HGB 12.7*  HCT 40.2  MCV 88.9  PLT 878    Basic Metabolic Panel: Recent Labs  Lab 05/12/21 1430  NA 141  K 3.3*  CL 107  CO2 20*  GLUCOSE 151*  BUN 13  CREATININE 1.14  CALCIUM 9.4   GFR: Estimated Creatinine Clearance: 76.5 mL/min (by C-G formula based on SCr of 1.14 mg/dL). Recent Labs  Lab 05/12/21 1430  WBC 14.2*    Liver Function Tests: No results for input(s): AST, ALT, ALKPHOS, BILITOT, PROT, ALBUMIN in the last 168 hours. No results for input(s): LIPASE, AMYLASE in the last 168 hours. No results for input(s): AMMONIA in the last 168 hours.  ABG    Component Value Date/Time   PHART 7.366 07/05/2012 0447   PCO2ART 45.7 (H) 07/05/2012 0447   PO2ART 135.0 (H) 07/05/2012 0447   HCO3 26.2 (H) 07/05/2012 0447   TCO2 28 07/05/2012 0447   O2SAT 99.0 07/05/2012 0447     Coagulation Profile: No results for input(s): INR, PROTIME in the last 168 hours.  Cardiac Enzymes: No results for input(s): CKTOTAL, CKMB, CKMBINDEX, TROPONINI in the last 168 hours.  HbA1C: No results found for: HGBA1C  CBG: No results for input(s): GLUCAP in the last 168 hours.  Review of Systems:   Review of Systems  Constitutional:  Negative for fever.  HENT:  Negative for hearing loss.   Eyes:  Negative for blurred vision.  Respiratory:  Negative for cough.   Cardiovascular:  Negative for chest pain.  Gastrointestinal:  Positive for abdominal pain. Negative for heartburn.  Genitourinary:  Negative for dysuria.  Musculoskeletal:  Negative for myalgias.  Skin:  Negative for rash.  Endo/Heme/Allergies:  Does not bruise/bleed easily.   Psychiatric/Behavioral:  Negative for depression.     Past Medical History:  He,  has a past medical history of GERD (gastroesophageal reflux disease), Glaucoma, Hypertension, Schizoaffective disorder, depressive type ([place]), and Schizophrenia, schizo-affective ([place]).   Surgical History:   Past Surgical History:  Procedure Laterality Date   FRACTURE SURGERY  1996   jaw    INCISION AND DRAINAGE / EXCISION THYROGLOSSAL CYST  06/30/11   w/central hyoid resection   THYROGLOSSAL DUCT CYST  06/30/2011   Procedure: THYROGLOSSAL DUCT CYST;  Surgeon: Jerrell Belfast, MD;  Location: [facility];  Service: ENT;  Laterality: N/A;  excision of thyroglossal duct cyst     Social History:   reports that he has never smoked. He has never used smokeless tobacco. He reports current alcohol use. He reports current drug use. Drugs: Marijuana and Cocaine.   Family History:  His family history is not on file.   Allergies  No Known Allergies   Home Medications  Prior to Admission medications   Medication Sig Start Date End Date Taking? Authorizing Provider  amLODipine (NORVASC) 10 MG tablet Take 10 mg by mouth daily. 02/27/20  Yes [provider]  losartan (COZAAR) 50 MG tablet Take 50 mg by mouth daily.   Yes [provider]  metFORMIN (GLUCOPHAGE) 500 MG tablet Take 500 mg by mouth daily with breakfast.   Yes [provider]  mirtazapine (REMERON) 15 MG tablet Take 15 mg by mouth at bedtime. 02/27/20  Yes [provider]  Multiple Vitamin (MULTIVITAMIN WITH MINERALS) TABS tablet Take 1 tablet by mouth daily.   Yes [provider]  OLANZapine (ZYPREXA) 15 MG tablet Take 1 tablet (15 mg total) by mouth at bedtime. 07/11/12  Yes Patrecia Pour, NP  omeprazole (PRILOSEC) 20 MG capsule Take 20 mg by mouth daily.   Yes [provider]  tadalafil (CIALIS) 20 MG tablet Take 20 mg by mouth daily as needed for erectile dysfunction. 12/11/20  Yes [provider]  amLODipine (NORVASC) 5 MG tablet Take 1 tablet (5 mg total) by mouth daily. Patient not taking: Reported on 06/17/2020 07/11/12   Patrecia Pour, NP  ASPIRIN LOW DOSE 81 MG EC tablet Take 81 mg by mouth daily. Patient not taking: Reported on 06/17/2020 02/27/20   [provider]  atenolol (TENORMIN) 25 MG tablet Take 1 tablet (25 mg total) by mouth daily. Patient not taking: Reported on 06/17/2020 07/11/12   Patrecia Pour, NP  atorvastatin (LIPITOR) 40 MG tablet Take 40 mg by mouth daily. Patient not taking: Reported on 06/17/2020 02/27/20   [provider]  cyclobenzaprine (FLEXERIL) 10 MG tablet Take 1 tablet (10 mg total) by mouth at bedtime. Patient not taking: Reported on 05/12/2021 11/13/13   Harvie Heck, PA-C  cyclobenzaprine (FLEXERIL) 10 MG tablet Take 1 tablet (10 mg total) by mouth 3 (three) times daily as needed for muscle spasms (or pain). Patient not taking: Reported on 05/12/2021 02/02/14   Clayton Bibles, PA-C  hydrOXYzine (ATARAX/VISTARIL) 50 MG tablet Take 1 tablet (50 mg total) by mouth at bedtime as needed for anxiety (sleep). Patient not taking: Reported on 05/12/2021 07/11/12   Patrecia Pour, NP  ibuprofen (ADVIL,MOTRIN) 800 MG tablet Take 1 tablet (800 mg total) by mouth 3 (three) times daily with meals. Patient not taking: Reported on 06/17/2020 11/13/13   Harvie Heck, PA-C  insulin glargine (LANTUS) 100 UNIT/ML injection 10 units Patient not taking: Reported on 05/12/2021    [provider]  metoprolol tartrate (LOPRESSOR) 25 MG tablet Take 25 mg by mouth 2 (two) times daily. Patient not taking: Reported on 06/17/2020 02/27/20   [provider]  sildenafil (REVATIO) 20 MG tablet 1 tab(s) Patient not taking: Reported on 05/12/2021 08/02/13   [provider]  timolol (BETIMOL) 0.5 % ophthalmic solution Place 1 drop into both eyes 2 (two) times daily. Patient not taking: Reported on 05/12/2021 07/11/12   Patrecia Pour, NP  traMADol (ULTRAM)  50 MG tablet Take 1 tablet (50 mg total) by mouth every 6 (six) hours as needed. Patient not taking: Reported on 05/12/2021 11/13/13   Harvie Heck, PA-C  vitamin C (ASCORBIC ACID) 250 MG tablet Take 250 mg by mouth daily. Patient not taking: Reported on 05/12/2021    [provider]     Critical care time: 45 min

## 2021-05-12 NOTE — Consult Note (Signed)
Hospital Consult    Reason for Consult: Concern for type B aortic dissection Requesting Physician: Dr. Langston Masker MRN #:  [id number]  History of Present Illness: This is a 66 y.o. male who presented to the [facility], ED earlier today with new onset chest pain and back pain.  He admitted to cocaine use prior to the onset of his pain. He underwent CT angio chest abdomen pelvis, demonstrated concern for type B aortic dissection.  Vascular surgery was called for further management.  On exam, Jimmy Marshall was fidgety, unable to have a linear thought process greater than a few sentences.  He denied back pain and said his chest pain had improved to 1/10 from 10 out of 10.  He denies having pain like this before.  He uses cocaine on a irregular basis.  He denied abdominal pain, bilateral lower extremity pain.  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 Authorizing Provider  amLODipine (NORVASC) 10 MG tablet Take 10 mg by mouth daily. 02/27/20  Yes [provider]  losartan (COZAAR) 50 MG tablet Take 50 mg by mouth daily.   Yes [provider]  metFORMIN (GLUCOPHAGE) 500 MG tablet Take 500 mg by mouth daily with breakfast.   Yes [provider]  mirtazapine (REMERON) 15 MG tablet  Take 15 mg by mouth at bedtime. 02/27/20  Yes [provider]  Multiple Vitamin (MULTIVITAMIN WITH MINERALS) TABS tablet Take 1 tablet by mouth daily.   Yes [provider]  OLANZapine (ZYPREXA) 15 MG tablet Take 1 tablet (15 mg total) by mouth at bedtime. 07/11/12  Yes Patrecia Pour, NP  omeprazole (PRILOSEC) 20 MG capsule Take 20 mg by mouth daily.   Yes [provider]  tadalafil (CIALIS) 20 MG tablet Take 20 mg by mouth daily as needed for erectile dysfunction. 12/11/20  Yes [provider]  amLODipine (NORVASC) 5 MG tablet Take 1 tablet (5 mg total) by mouth daily. Patient not taking: Reported on 06/17/2020 07/11/12   Patrecia Pour, NP  ASPIRIN LOW DOSE 81 MG EC tablet Take 81 mg by mouth daily. Patient not taking: Reported on 06/17/2020 02/27/20   [provider]  atenolol (TENORMIN) 25 MG tablet Take 1 tablet (25 mg total) by mouth daily. Patient not taking: Reported on 06/17/2020 07/11/12   Patrecia Pour, NP  atorvastatin (LIPITOR) 40 MG tablet Take 40 mg by mouth daily. Patient not taking: Reported on 06/17/2020 02/27/20   [provider]  cyclobenzaprine (FLEXERIL) 10 MG tablet Take 1 tablet (10 mg total) by mouth at bedtime. Patient not taking: Reported on 05/12/2021 11/13/13   Harvie Heck, PA-C  cyclobenzaprine (FLEXERIL) 10 MG tablet Take 1 tablet (10 mg total) by mouth 3 (three)  times daily as needed for muscle spasms (or pain). Patient not taking: Reported on 05/12/2021 02/02/14   Clayton Bibles, PA-C  hydrOXYzine (ATARAX/VISTARIL) 50 MG tablet Take 1 tablet (50 mg total) by mouth at bedtime as needed for anxiety (sleep). Patient not taking: Reported on 05/12/2021 07/11/12   Patrecia Pour, NP  ibuprofen (ADVIL,MOTRIN) 800 MG tablet Take 1 tablet (800 mg total) by mouth 3 (three) times daily with meals. Patient not taking: Reported on 06/17/2020 11/13/13   Harvie Heck, PA-C  insulin glargine (LANTUS) 100 UNIT/ML injection 10 units Patient  not taking: Reported on 05/12/2021    [provider]  metoprolol tartrate (LOPRESSOR) 25 MG tablet Take 25 mg by mouth 2 (two) times daily. Patient not taking: Reported on 06/17/2020 02/27/20   [provider]  sildenafil (REVATIO) 20 MG tablet 1 tab(s) Patient not taking: Reported on 05/12/2021 08/02/13   [provider]  timolol (BETIMOL) 0.5 % ophthalmic solution Place 1 drop into both eyes 2 (two) times daily. Patient not taking: Reported on 05/12/2021 07/11/12   Patrecia Pour, NP  traMADol (ULTRAM) 50 MG tablet Take 1 tablet (50 mg total) by mouth every 6 (six) hours as needed. Patient not taking: Reported on 05/12/2021 11/13/13   Harvie Heck, PA-C  vitamin C (ASCORBIC ACID) 250 MG tablet Take 250 mg by mouth daily. Patient not taking: Reported on 05/12/2021    [provider]    Social History   Socioeconomic History   Marital status: Divorced    Spouse name: Not on file   Number of children: Not on file   Years of education: Not on file   Highest education level: Not on file  Occupational History   Not on file  Tobacco Use   Smoking status: Never   Smokeless tobacco: Never  Substance and Sexual Activity   Alcohol use: Yes    Comment: "stopped drinking 1999"   Drug use: Yes    Types: Marijuana, Cocaine    Comment: "last marijuana 1990's; last  recreational cocaine 1999"   Sexual activity: Never  Other Topics Concern   Not on file  Social History Narrative   Not on file   Social Determinants of Health   Financial Resource Strain: Not on file  Food Insecurity: Not on file  Transportation Needs: Not on file  Physical Activity: Not on file  Stress: Not on file  Social Connections: Not on file  Intimate Partner Violence: Not on file   History reviewed. No pertinent family history.  ROS: Otherwise negative unless mentioned in HPI  Physical Examination  Vitals:   05/12/21 1645 05/12/21 1851  BP: (!) 155/86 (!) 140/97  Pulse: (!) 43  (!) 46  Resp: 16 (!) 23  Temp:    SpO2: 100% 98%   Body mass index is 27.98 kg/m.  General:  WDWN in NAD Gait: Not observed HENT: WNL, normocephalic Pulmonary: normal non-labored breathing, without Rales, rhonchi,  wheezing Cardiac: regular Abdomen:  soft, NT/ND, no masses Skin: without rashes Vascular Exam/Pulses: 2+ dorsalis pedis bilaterally, 2+ femoral, 2+ radial Extremities: without ischemic changes, without Gangrene , without cellulitis; without open wounds;  Musculoskeletal: no muscle wasting or atrophy  Neurologic: A&O X 3;  No focal weakness or paresthesias are detected; speech is fluent/normal Psychiatric:  The pt has Normal affect. Lymph:  Unremarkable  CBC    Component Value Date/Time   WBC 14.2 (H) 05/12/2021 1430   RBC 4.52 05/12/2021 1430   HGB 12.7 (L) 05/12/2021  1430   HCT 40.2 05/12/2021 1430   PLT 298 05/12/2021 1430   MCV 88.9 05/12/2021 1430   MCH 28.1 05/12/2021 1430   MCHC 31.6 05/12/2021 1430   RDW 17.7 (H) 05/12/2021 1430   LYMPHSABS 2.4 07/05/2012 0443   MONOABS 0.3 07/05/2012 0443   EOSABS 0.0 07/05/2012 0443   BASOSABS 0.0 07/05/2012 0443    BMET    Component Value Date/Time   NA 141 05/12/2021 1430   K 3.3 (L) 05/12/2021 1430   CL 107 05/12/2021 1430   CO2 20 (L) 05/12/2021 1430   GLUCOSE 151 (H) 05/12/2021 1430   BUN 13 05/12/2021 1430   CREATININE 1.14 05/12/2021 1430   CALCIUM 9.4 05/12/2021 1430   GFRNONAA >60 05/12/2021 1430   GFRAA 74 (L) 07/07/2012 0525    COAGS: Lab Results  Component Value Date   INR 1.02 07/05/2012     Non-Invasive Vascular Imaging:      ASSESSMENT/PLAN: This is a 66 y.o. 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  Intramural hematoma occurs with disruption of the vasa vasorum of the aorta.  Cocaine abuse is a common etiology for both dissection and intramural hematoma formation.  His back pain  has significantly improved, blood pressure now 133/80.  Heart rate 43BPM. He appears to be withdrawing from cocaine, and possibly other substances.  Jimmy Marshall would benefit from ICU admission with goal systolic blood pressure less than 159mmhg. Due to his low heart rate, I recommended nitro drip.  Heart rate less than 70.  My plan is to re-image his aorta at the 48-hour mark to assess for interval changes. No plan for urgent surgery. Patient can have a regular diet. Recommend daily labs.  He was asked to notify nursing immediately should new onset back chest or abdominal pain occur. I will continue to follow and appreciate the ICU's assistance   Cassandria Santee MD MS [facility] [phone] 05/12/2021  7:43 PM

## 2021-05-12 NOTE — Progress Notes (Signed)
[facility] Progress Note Patient Name: Zeeshan Korte DOB: 1955/09/13 MRN: [id number]   Date of Service  05/12/2021  HPI/Events of Note  Brief HPI:  H and P, Notes and labs reviewed.   Data: reviewed PVC's. Sinus brady . Qtc 442 K 3.3, Cr 1.14, LFT ok. Trop x 2 wnl. Wbc 14 K Glucose 151 RVP pending to do.  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  Goal systolic BP <449.  Goal HR <70.  Started on Nitro gtt by vascular surgery.   Plan to re-image aorta in 48 hours.  No urgent surgery plan.  ICU for bp monitoring and control.   Bradycardia: Watch for now as long as BP stable. And asymptomatic. Get TSH if not done. Electrolytes follow through for AM.' Trend troponin.  DM - hold oral meds. Ssi. Goal < 180  ETOH hx. CIWA protocol. Cocaine/THC +.   VTELovenox.  CBG goals < 180  eICU Interventions  As aSSI and TSH ordered Replace potassium if not done from ED get stat Mag/K level. bove     Intervention Category Major Interventions: Other: (Type 2 aortuc disection) Evaluation Type: New Patient Evaluation  Elmer Sow 05/12/2021, 11:06 PM

## 2021-05-12 NOTE — ED Notes (Addendum)
Pt requested to use the bathroom as they needed to have a bowel movement. Pt assisted to restroom with no issue. Pt walked back to room without help from staff. Pt was heard--to what appeared to be vomiting--pt was checked on immediately and pt had vomited all over room. Pt vomited approximately 253mL Pt was asked what type of bowel movement they had and pt stated it was consistent and hard. EMT unable to observe.

## 2021-05-12 NOTE — ED Provider Notes (Signed)
66 yo male here with chest pain, discomfort.  Reports use of cocaine Sunday and today.  Also used cialis at home.  Pain improved with fentanyl and ativan here.  Initial trop wnl.  Pending CT chest, 2nd trop, reassessment of pain.  6 pm -I spoke to the radiologist reports there is some concern for a type B dissection, beginning at the left subclavian and extending into the abdomen.  It is not clear if this is acute or chronic.  I reassessed the patient and he reports that his pain overall significantly improved, however it is still present.  He said it started in his left chest and feels that it moved down to the abdomen, not radiating to the back.  He is currently hungry and asking for food.  Will maintain n.p.o. status while I reach out to vascular surgeon on-call.  His vital signs otherwise remain within fairly normal limits, some mild hypertension but also sinus bradycardia.  Clinical Course as of 05/12/21 2127  Mon May 12, 2021  1600 Patient did admit that he's been using cocaine, most recently this morning. Given this he was given IV ativan and now looks much better. Chest pain seems to be resolved, though is now complaining of abd pain. Will continue with CT dissection study and add on lfts and lipase.  [SG]  1933 Consult placed with Dr Laurine Blazer vascular surgery who will come evaluate patient - patient reassessed - reporting pain minimal/gone, but he did get nauseous and vomit here. [MT]  2023 Dr Unk Lightning vascular surgeon recommending IV infusion for BP control - nitro would be okay as patient has some bradycardia and beta blockers might be too much.  Admit to ICU for monitoring, likely CIWA withdrawal protocol.  Paged ICU team [MT]  2100 Admitted to ICU team [MT]    Clinical Course User Index [MT] Jimmy Marshall, Carola Rhine, MD [SG] Sherwood Gambler, MD      Wyvonnia Dusky, MD 05/12/21 2127

## 2021-05-12 NOTE — ED Notes (Signed)
Patient transported to CT 

## 2021-05-13 DIAGNOSIS — I7102 Dissection of abdominal aorta: Secondary | ICD-10-CM | POA: Diagnosis not present

## 2021-05-13 LAB — HEMOGLOBIN A1C
Hgb A1c MFr Bld: 5.3 % (ref 4.8–5.6)
Mean Plasma Glucose: 105.41 mg/dL

## 2021-05-13 LAB — CBC
HCT: 35.2 % — ABNORMAL LOW (ref 39.0–52.0)
Hemoglobin: 11.7 g/dL — ABNORMAL LOW (ref 13.0–17.0)
MCH: 28.3 pg (ref 26.0–34.0)
MCHC: 33.2 g/dL (ref 30.0–36.0)
MCV: 85 fL (ref 80.0–100.0)
Platelets: 259 10*3/uL (ref 150–400)
RBC: 4.14 MIL/uL — ABNORMAL LOW (ref 4.22–5.81)
RDW: 17.3 % — ABNORMAL HIGH (ref 11.5–15.5)
WBC: 12.5 10*3/uL — ABNORMAL HIGH (ref 4.0–10.5)
nRBC: 0 % (ref 0.0–0.2)

## 2021-05-13 LAB — URINALYSIS, ROUTINE W REFLEX MICROSCOPIC
Bacteria, UA: NONE SEEN
Bilirubin Urine: NEGATIVE
Glucose, UA: 50 mg/dL — AB
Ketones, ur: NEGATIVE mg/dL
Leukocytes,Ua: NEGATIVE
Nitrite: NEGATIVE
Protein, ur: 30 mg/dL — AB
Specific Gravity, Urine: 1.017 (ref 1.005–1.030)
pH: 6 (ref 5.0–8.0)

## 2021-05-13 LAB — CBC WITH DIFFERENTIAL/PLATELET
Abs Immature Granulocytes: 0.08 10*3/uL — ABNORMAL HIGH (ref 0.00–0.07)
Basophils Absolute: 0 10*3/uL (ref 0.0–0.1)
Basophils Relative: 0 %
Eosinophils Absolute: 0 10*3/uL (ref 0.0–0.5)
Eosinophils Relative: 0 %
HCT: 34.3 % — ABNORMAL LOW (ref 39.0–52.0)
Hemoglobin: 11.9 g/dL — ABNORMAL LOW (ref 13.0–17.0)
Immature Granulocytes: 1 %
Lymphocytes Relative: 12 %
Lymphs Abs: 1.8 10*3/uL (ref 0.7–4.0)
MCH: 29 pg (ref 26.0–34.0)
MCHC: 34.7 g/dL (ref 30.0–36.0)
MCV: 83.5 fL (ref 80.0–100.0)
Monocytes Absolute: 1 10*3/uL (ref 0.1–1.0)
Monocytes Relative: 7 %
Neutro Abs: 11.7 10*3/uL — ABNORMAL HIGH (ref 1.7–7.7)
Neutrophils Relative %: 80 %
Platelets: UNDETERMINED 10*3/uL (ref 150–400)
RBC: 4.11 MIL/uL — ABNORMAL LOW (ref 4.22–5.81)
RDW: 17.2 % — ABNORMAL HIGH (ref 11.5–15.5)
Smear Review: UNDETERMINED
WBC: 14.5 10*3/uL — ABNORMAL HIGH (ref 4.0–10.5)
nRBC: 0 % (ref 0.0–0.2)

## 2021-05-13 LAB — POTASSIUM: Potassium: 3.5 mmol/L (ref 3.5–5.1)

## 2021-05-13 LAB — COMPREHENSIVE METABOLIC PANEL
ALT: 65 U/L — ABNORMAL HIGH (ref 0–44)
AST: 42 U/L — ABNORMAL HIGH (ref 15–41)
Albumin: 4 g/dL (ref 3.5–5.0)
Alkaline Phosphatase: 45 U/L (ref 38–126)
Anion gap: 12 (ref 5–15)
BUN: 16 mg/dL (ref 8–23)
CO2: 22 mmol/L (ref 22–32)
Calcium: 9 mg/dL (ref 8.9–10.3)
Chloride: 103 mmol/L (ref 98–111)
Creatinine, Ser: 1.01 mg/dL (ref 0.61–1.24)
GFR, Estimated: >60 mL/min (ref >60)
Glucose, Bld: 111 mg/dL — ABNORMAL HIGH (ref 70–99)
Potassium: 3.4 mmol/L — ABNORMAL LOW (ref 3.5–5.1)
Sodium: 137 mmol/L (ref 135–145)
Total Bilirubin: 1.5 mg/dL — ABNORMAL HIGH (ref 0.3–1.2)
Total Protein: 6.2 g/dL — ABNORMAL LOW (ref 6.5–8.1)

## 2021-05-13 LAB — PROTIME-INR
INR: 1.2 (ref 0.8–1.2)
Prothrombin Time: 15.2 seconds (ref 11.4–15.2)

## 2021-05-13 LAB — GLUCOSE, CAPILLARY
Glucose-Capillary: 102 mg/dL — ABNORMAL HIGH (ref 70–99)
Glucose-Capillary: 120 mg/dL — ABNORMAL HIGH (ref 70–99)
Glucose-Capillary: 127 mg/dL — ABNORMAL HIGH (ref 70–99)
Glucose-Capillary: 132 mg/dL — ABNORMAL HIGH (ref 70–99)

## 2021-05-13 LAB — CREATININE, SERUM
Creatinine, Ser: 1 mg/dL (ref 0.61–1.24)
GFR, Estimated: >60 mL/min (ref >60)

## 2021-05-13 LAB — CK: Total CK: 1156 U/L — ABNORMAL HIGH (ref 49–397)

## 2021-05-13 LAB — HIV ANTIBODY (ROUTINE TESTING W REFLEX): HIV Screen 4th Generation wRfx: NONREACTIVE

## 2021-05-13 LAB — TSH: TSH: 0.573 u[IU]/mL (ref 0.350–4.500)

## 2021-05-13 LAB — MRSA NEXT GEN BY PCR, NASAL: MRSA by PCR Next Gen: NOT DETECTED

## 2021-05-13 LAB — PROCALCITONIN: Procalcitonin: 0.52 ng/mL

## 2021-05-13 LAB — MAGNESIUM: Magnesium: 2.1 mg/dL (ref 1.7–2.4)

## 2021-05-13 MED ORDER — CARVEDILOL 6.25 MG PO TABS
6.2500 mg | ORAL_TABLET | Freq: Two times a day (BID) | ORAL | Status: DC
  Filled 2021-05-13: qty 1

## 2021-05-13 MED ORDER — HYDRALAZINE HCL 10 MG PO TABS
10.0000 mg | ORAL_TABLET | Freq: Three times a day (TID) | ORAL | Status: DC
  Administered 2021-05-13: 10 mg via ORAL
  Filled 2021-05-13: qty 1

## 2021-05-13 MED ORDER — ACETAMINOPHEN 160 MG/5ML PO SOLN
650.0000 mg | ORAL | Status: DC | PRN
  Administered 2021-05-14: 650 mg via ORAL
  Filled 2021-05-13: qty 20.3

## 2021-05-13 MED ORDER — POTASSIUM CHLORIDE CRYS ER 20 MEQ PO TBCR
40.0000 meq | EXTENDED_RELEASE_TABLET | Freq: Once | ORAL | Status: AC
  Administered 2021-05-13: 40 meq via ORAL
  Filled 2021-05-13: qty 2

## 2021-05-13 MED ORDER — CARVEDILOL 6.25 MG PO TABS
6.2500 mg | ORAL_TABLET | Freq: Two times a day (BID) | ORAL | Status: DC
  Administered 2021-05-13: 6.25 mg via ORAL
  Filled 2021-05-13: qty 1

## 2021-05-13 MED ORDER — ACETAMINOPHEN 160 MG/5ML PO SOLN: 650.0000 mg | ORAL | Status: DC | PRN

## 2021-05-13 MED ORDER — CHLORHEXIDINE GLUCONATE CLOTH 2 % EX PADS
6.0000 | MEDICATED_PAD | Freq: Every day | CUTANEOUS | Status: DC
  Administered 2021-05-13 – 2021-05-15 (×5): 6 via TOPICAL

## 2021-05-13 MED ORDER — HYDRALAZINE HCL 25 MG PO TABS
25.0000 mg | ORAL_TABLET | Freq: Three times a day (TID) | ORAL | Status: DC
  Administered 2021-05-13 – 2021-05-16 (×9): 25 mg via ORAL
  Filled 2021-05-13 (×9): qty 1

## 2021-05-13 NOTE — Progress Notes (Addendum)
°  Progress Note    05/13/2021 8:02 AM * No surgery found *  Subjective:  denies any chest pain. Very fidgety    Vitals:   05/13/21 0600 05/13/21 0700  BP: 137/65   Pulse: 61   Resp: (!) 21   Temp:  (!) 100.7 F (38.2 C)  SpO2: 99%    Physical Exam: Cardiac:  regular Lungs:  non labored Extremities:  well perfused and warm with palpable radial pulses, palpable Dp pulses bilaterally Abdomen:  soft, distended, non tender Neurologic: alert and oriented  CBC    Component Value Date/Time   WBC 12.5 (H) 05/13/2021 0057   RBC 4.14 (L) 05/13/2021 0057   HGB 11.7 (L) 05/13/2021 0057   HCT 35.2 (L) 05/13/2021 0057   PLT 259 05/13/2021 0057   MCV 85.0 05/13/2021 0057   MCH 28.3 05/13/2021 0057   MCHC 33.2 05/13/2021 0057   RDW 17.3 (H) 05/13/2021 0057   LYMPHSABS 2.4 07/05/2012 0443   MONOABS 0.3 07/05/2012 0443   EOSABS 0.0 07/05/2012 0443   BASOSABS 0.0 07/05/2012 0443    BMET    Component Value Date/Time   NA 141 05/12/2021 1430   K 3.5 05/13/2021 0057   CL 107 05/12/2021 1430   CO2 20 (L) 05/12/2021 1430   GLUCOSE 151 (H) 05/12/2021 1430   BUN 13 05/12/2021 1430   CREATININE 1.00 05/13/2021 0057   CALCIUM 9.4 05/12/2021 1430   GFRNONAA >60 05/13/2021 0057   GFRAA 74 (L) 07/07/2012 0525    INR    Component Value Date/Time   INR 1.2 05/13/2021 0548     Intake/Output Summary (Last 24 hours) at 05/13/2021 0802 Last data filed at 05/13/2021 0600 Gross per 24 hour  Intake 57.55 ml  Output 700 ml  Net -642.45 ml      Assessment/Plan:  66 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 May need to increase to keep SBP lower. Has been in the 130-170 range No surgery indicated at this time Will re image in 24-48 hours Febrile. Leukocytosis 12.5 Will continue to closely monitor Medical management per primary  DVT prophylaxis:  heparin gtt   Karoline Caldwell, PA-C [facility] [phone] 05/13/2021 8:02 AM  VASCULAR STAFF ADDENDUM: I have independently interviewed and examined the patient. I agree with the above.  Will continue to follow.  Unsure of whether is he is withdrawing v known baseline psych history.   Cassandria Santee, MD [facility] Phone Number: [phone] 05/13/2021 8:21 AM

## 2021-05-13 NOTE — H&P (Addendum)
This is a progress note     NAME:  Jimmy Marshall, MRN:  [id number], DOB:  January 25, 1956, LOS: 1 ADMISSION DATE:  05/12/2021, CONSULTATION DATE:  05/12/21 REFERRING MD:  Regenia Skeeter, CHIEF COMPLAINT:  Abdominal pain, chest pain   History of Present Illness:  66 yo man with a history of here with abdominal pain and chest pain.   On Arrival, chest pain, ashen, diaphoretic.    Today donated plasma.  Took cialis x 2 today around noon.   Chest pain started at 1220.   Also took cocaine this morning.   He said he does cocaine rarely and only took "a small amount" .   He tells me his pain is mostly resolved, still at about 10 percent of what it was.  He has been feeling nauseous.  He is complaining of hunger and thirst.  Did not take his po meds today.   In ed: episode of emesis.   WBC slightly elevated from BL>  Utox pos for cocaine and THC ASA, fentanyl 50, ativan 1gm x 2 , zofran, thiamine given.   Seen by Vascular surgery.  Concern for Type B aortic dissection.    Pertinent  Medical History  GERD DM Glaucoma HTN Schizoaffective d/o.  Hx of thyroglossal cyst, s/p excision 2013  Home meds: norvasc, cozaar, metformin, remeron, mvi, zyprexa, omeprazole.  NOT TAKING:  Asa, atenolol, atorvastatin, flexeril, hydroxyzine, ibuprofen, metoprolol, timolol, ultram, vitamin c sildenafil, vitamin C  Significant Hospital Events: Including procedures, antibiotic start and stop dates in addition to other pertinent events   2/6 admitted  Interim History / Subjective:  No events, seems restless. Febrile WBC up  Objective   Blood pressure 133/72, pulse 65, temperature (!) 100.7 F (38.2 C), temperature source Oral, resp. rate (!) 27, height 5\' 11"  (1.803 m), weight 86 kg, SpO2 92 %.        Intake/Output Summary (Last 24 hours) at 05/13/2021 1004 Last data filed at 05/13/2021 0800 Gross per 24 hour  Intake 74.6 ml  Output 700 ml  Net -625.4 ml    Filed Weights   05/12/21 1427 05/13/21 0515   Weight: 91 kg 86 kg    Examination: Constitutional: restless man laying in bed  Eyes: EOMI, pupils equal Ears, nose, mouth, and throat: MMM, trachea midline Cardiovascular: RRR, ext warm Respiratory: Clear, no wheezing Gastrointestinal: soft, +BS Skin: No rashes, normal turgor Neurologic: fidgety but neurologically nonfocal Psychiatric: Aox3  UDS cocaine and [facility]  Resolved Hospital Problem list     Assessment & Plan:  Hypertensive emergency with Type B dissection in setting of active cocaine use; symptoms seem improved; renal function preserved R/o sepsis (fevers, white count) Distant hx of alcohol abuse Question baseline psych disorder Hx DM2, no longer has this, A1c 5%  - D/C CIWA - Continue amlodipine, losartan, add hydralazine, bradycardia limits BB options; wean of nitro gtt; goal SBP <120 and HR < 70 - DC fingersticks - Repeat imaging per VVS - Check blood cx, UA, Pct, CK  Best Practice (right click and "Reselect all SmartList Selections" daily)   Diet/type: Regular consistency (see orders) DVT prophylaxis: LMWH GI prophylaxis: PPI Lines: N/A Foley:  N/A Code Status:  full code Last date of multidisciplinary goals of care discussion [pending]   Patient critically ill due to hypertensive emergency Interventions to address this today nitro drip titration Risk of deterioration without these interventions is high  I personally spent 31 minutes providing critical care not including any separately billable procedures  Erskine Emery  MD [facility]  Prefer epic messenger for cross cover needs If after hours, please call E-link

## 2021-05-13 NOTE — Progress Notes (Signed)
[facility] ADULT ICU REPLACEMENT PROTOCOL   The patient does apply for the [facility] Adult ICU Electrolyte Replacment Protocol based on the criteria listed below:   1.Exclusion criteria: TCTS patients, ECMO patients, and Dialysis patients 2. Is GFR >/= 30 ml/min? Yes.    Patient's GFR today is >60 3. Is SCr </= 2? Yes.   Patient's SCr is 1.00 mg/dL 4. Did SCr increase >/= 0.5 in 24 hours? No. 5.Pt's weight >40kg  Yes.   6. Abnormal electrolyte(s): K+ 3.5  7. Electrolytes replaced per protocol 8.  Call MD STAT for K+ </= 2.5, Phos </= 1, or Mag </= 1 Physician:  n/a   Darlys Gales 05/13/2021 3:03 AM

## 2021-05-14 ENCOUNTER — Inpatient Hospital Stay (HOSPITAL_COMMUNITY): Payer: Medicaid Other

## 2021-05-14 DIAGNOSIS — I7102 Dissection of abdominal aorta: Secondary | ICD-10-CM | POA: Diagnosis not present

## 2021-05-14 LAB — PHOSPHORUS: Phosphorus: 2.6 mg/dL (ref 2.5–4.6)

## 2021-05-14 LAB — CBC
HCT: 33.6 % — ABNORMAL LOW (ref 39.0–52.0)
Hemoglobin: 11.1 g/dL — ABNORMAL LOW (ref 13.0–17.0)
MCH: 27.7 pg (ref 26.0–34.0)
MCHC: 33 g/dL (ref 30.0–36.0)
MCV: 83.8 fL (ref 80.0–100.0)
Platelets: 237 10*3/uL (ref 150–400)
RBC: 4.01 MIL/uL — ABNORMAL LOW (ref 4.22–5.81)
RDW: 17 % — ABNORMAL HIGH (ref 11.5–15.5)
WBC: 14.7 10*3/uL — ABNORMAL HIGH (ref 4.0–10.5)
nRBC: 0 % (ref 0.0–0.2)

## 2021-05-14 LAB — MAGNESIUM: Magnesium: 2.3 mg/dL (ref 1.7–2.4)

## 2021-05-14 LAB — HEPATIC FUNCTION PANEL
ALT: 57 U/L — ABNORMAL HIGH (ref 0–44)
AST: 40 U/L (ref 15–41)
Albumin: 3.6 g/dL (ref 3.5–5.0)
Alkaline Phosphatase: 43 U/L (ref 38–126)
Bilirubin, Direct: 0.2 mg/dL (ref 0.0–0.2)
Indirect Bilirubin: 1.2 mg/dL — ABNORMAL HIGH (ref 0.3–0.9)
Total Bilirubin: 1.4 mg/dL — ABNORMAL HIGH (ref 0.3–1.2)
Total Protein: 6 g/dL — ABNORMAL LOW (ref 6.5–8.1)

## 2021-05-14 LAB — BASIC METABOLIC PANEL
Anion gap: 10 (ref 5–15)
BUN: 14 mg/dL (ref 8–23)
CO2: 24 mmol/L (ref 22–32)
Calcium: 8.9 mg/dL (ref 8.9–10.3)
Chloride: 101 mmol/L (ref 98–111)
Creatinine, Ser: 1.06 mg/dL (ref 0.61–1.24)
GFR, Estimated: >60 mL/min (ref >60)
Glucose, Bld: 130 mg/dL — ABNORMAL HIGH (ref 70–99)
Potassium: 3.5 mmol/L (ref 3.5–5.1)
Sodium: 135 mmol/L (ref 135–145)

## 2021-05-14 LAB — PROCALCITONIN: Procalcitonin: 0.56 ng/mL

## 2021-05-14 MED ORDER — POTASSIUM CHLORIDE CRYS ER 20 MEQ PO TBCR
40.0000 meq | EXTENDED_RELEASE_TABLET | Freq: Once | ORAL | Status: AC
  Administered 2021-05-14: 40 meq via ORAL
  Filled 2021-05-14: qty 2

## 2021-05-14 MED ORDER — ACETAMINOPHEN 325 MG PO TABS
650.0000 mg | ORAL_TABLET | ORAL | Status: DC | PRN
  Administered 2021-05-14 – 2021-05-15 (×3): 650 mg via ORAL
  Filled 2021-05-14 (×4): qty 2

## 2021-05-14 MED ORDER — IOHEXOL 350 MG/ML SOLN
100.0000 mL | Freq: Once | INTRAVENOUS | Status: AC | PRN
  Administered 2021-05-14: 100 mL via INTRAVENOUS

## 2021-05-14 MED ORDER — DIAZEPAM 5 MG PO TABS
5.0000 mg | ORAL_TABLET | Freq: Three times a day (TID) | ORAL | Status: DC | PRN
  Administered 2021-05-14 – 2021-05-15 (×2): 5 mg via ORAL
  Filled 2021-05-14 (×2): qty 1

## 2021-05-14 MED ORDER — CARVEDILOL 12.5 MG PO TABS
12.5000 mg | ORAL_TABLET | Freq: Two times a day (BID) | ORAL | Status: DC
  Administered 2021-05-14 – 2021-05-16 (×5): 12.5 mg via ORAL
  Filled 2021-05-14 (×5): qty 1

## 2021-05-14 NOTE — Progress Notes (Signed)
NAME:  Jimmy Marshall, MRN:  [id number], DOB:  1955/06/29, LOS: 2 ADMISSION DATE:  05/12/2021, CONSULTATION DATE:  05/12/21 REFERRING MD:  Regenia Skeeter, CHIEF COMPLAINT:  Abdominal pain, chest pain   History of Present Illness:  66 yo man with a history of here with abdominal pain and chest pain.   On Arrival, chest pain, ashen, diaphoretic.    Today donated plasma.  Took cialis x 2 today around noon.   Chest pain started at 1220.   Also took cocaine this morning.   He said he does cocaine rarely and only took "a small amount" .   He tells me his pain is mostly resolved, still at about 10 percent of what it was.  He has been feeling nauseous.  He is complaining of hunger and thirst.  Did not take his po meds today.   In ed: episode of emesis.   WBC slightly elevated from BL>  Utox pos for cocaine and THC ASA, fentanyl 50, ativan 1gm x 2 , zofran, thiamine given.   Seen by Vascular surgery.  Concern for Type B aortic dissection.    Pertinent  Medical History  GERD DM Glaucoma HTN Schizoaffective d/o.  Hx of thyroglossal cyst, s/p excision 2013  Home meds: norvasc, cozaar, metformin, remeron, mvi, zyprexa, omeprazole.  NOT TAKING:  Asa, atenolol, atorvastatin, flexeril, hydroxyzine, ibuprofen, metoprolol, timolol, ultram, vitamin c sildenafil, vitamin C   UDS cocaine and THC  Significant Hospital Events: Including procedures, antibiotic start and stop dates in addition to other pertinent events   2/6 admitted  Interim History / Subjective:  Hypertensive overnight. NTG drip ongoing. Emesis overnight as well.  Fevers to 102.1 No respiratory or urinary complaints. No derm complaints.   Objective   Blood pressure 117/86, pulse 96, temperature 99.2 F (37.3 C), temperature source Oral, resp. rate 13, height 5\' 11"  (1.803 m), weight 85.9 kg, SpO2 90 %.        Intake/Output Summary (Last 24 hours) at 05/14/2021 6073 Last data filed at 05/14/2021 0600 Gross per 24 hour  Intake  1037.91 ml  Output 1775 ml  Net -737.09 ml    Filed Weights   05/12/21 1427 05/13/21 0515 05/14/21 0500  Weight: 91 kg 86 kg 85.9 kg    Examination:  Constitutional: middle aged male in NAD resting inbed Eyes: PERRL, EOMI Ears, nose, mouth, and throat: MMM, trachea midline Cardiovascular: RRR, no edema.  Respiratory: Clear, no distress Gastrointestinal: Soft, non-tender.  Skin: Grossly intact. No rash. No edema. No erythema.  Neurologic: Alert, oriented, non-focal.    Resolved Hospital Problem list     Assessment & Plan:  Hypertensive emergency with Type B dissection in setting of active cocaine use; symptoms seem improved; renal function preserved - SBP goal < 160mmHg and HR < 70 - NTG drip at 85 this morning (up from 50 yesterday) - Continue amlodipine, hydralazine, losartan - Increase coreg to 12.5 mg BID - Repeat imaging per VVS  R/o sepsis (fevers, white count. May simply be a result of the dissection) - Blood cx pending - UA non-infectious - Trend - PCT 0.5  Distant hx of alcohol abuse - CIWA DC  Question baseline psych disorder Hx DM2, no longer has this, A1c 5% -  monitor   Best Practice (right click and "Reselect all SmartList Selections" daily)   Diet/type: Regular consistency (see orders) DVT prophylaxis: LMWH GI prophylaxis: PPI Lines: N/A Foley:  N/A Code Status:  full code Last date of multidisciplinary goals of  care discussion [pending]  Critical care time: 38 minutes  Georgann Housekeeper, AGACNP-BC [facility]  See Amion for personal pager PCCM on call pager [phone] until 7pm. Please call Elink 7p-7a. [phone]  05/14/2021 7:15 AM

## 2021-05-14 NOTE — Care Management (Signed)
°  Transition of Care [facility]) Screening Note   Patient Details  Name: Jimmy Marshall Date of Birth: 15-Oct-1955   Transition of Care [facility]) CM/SW Contact:    Carles Collet, RN Phone Number: 05/14/2021, 1:34 PM    Transition of Care Department [facility]) has reviewed patient and we will continue to monitor patient advancement through interdisciplinary progression rounds. If new patient transition needs arise, please place a TOC consult.

## 2021-05-14 NOTE — Social Work (Signed)
CSW noted substance use consult and met with pt at bedside. Pt states he has only used cocaine twice in the last year and before that it had been about 20 years since he has had any. Pt states that given his current medical condition, it will likely be another 20 years before he uses again. He declines resources at this time. CSW advised that social work is available for any further needs.

## 2021-05-14 NOTE — Progress Notes (Signed)
2000: Upon assessment pt's IV found to be laying on top of skin with medication leaking down patients arm. New IV placed and nitro gtt restarted.   0000 05/14/21: From 2200 on nitro gtt was titrated upward with no effect on blood pressure. At this assessment time the recently placed IV was found removed. Pt turns a lot in bed and is unintentionally removing PIVs. New IV placed and nitro restarted.   7282: Pt called me to beside as he had episode of emesis. This was only 5 minutes after drinking tylenol solution. Pt stated he did not feel nauseous but just like he had nothing on his stomach. He ate some saltine crackers and reported feeling better.

## 2021-05-14 NOTE — Progress Notes (Addendum)
°  Progress Note    05/14/2021 9:00 AM * No surgery found *  Subjective:  Occasional chest pain but much improved since admission.  Feverish with chills overnight   Vitals:   05/14/21 0645 05/14/21 0732  BP: 117/86   Pulse: 96   Resp: 13   Temp:  (!) 101.6 F (38.7 C)  SpO2:     Physical Exam: Lungs:  non labored Extremities:  palpable DP pulses Abdomen:  soft, NT, ND Neurologic: A&O  CBC    Component Value Date/Time   WBC 14.7 (H) 05/14/2021 0556   RBC 4.01 (L) 05/14/2021 0556   HGB 11.1 (L) 05/14/2021 0556   HCT 33.6 (L) 05/14/2021 0556   PLT 237 05/14/2021 0556   MCV 83.8 05/14/2021 0556   MCH 27.7 05/14/2021 0556   MCHC 33.0 05/14/2021 0556   RDW 17.0 (H) 05/14/2021 0556   LYMPHSABS 1.8 05/13/2021 0548   MONOABS 1.0 05/13/2021 0548   EOSABS 0.0 05/13/2021 0548   BASOSABS 0.0 05/13/2021 0548    BMET    Component Value Date/Time   NA 135 05/14/2021 0556   K 3.5 05/14/2021 0556   CL 101 05/14/2021 0556   CO2 24 05/14/2021 0556   GLUCOSE 130 (H) 05/14/2021 0556   BUN 14 05/14/2021 0556   CREATININE 1.06 05/14/2021 0556   CALCIUM 8.9 05/14/2021 0556   GFRNONAA >60 05/14/2021 0556   GFRAA 74 (L) 07/07/2012 0525    INR    Component Value Date/Time   INR 1.2 05/13/2021 0548     Intake/Output Summary (Last 24 hours) at 05/14/2021 0900 Last data filed at 05/14/2021 0600 Gross per 24 hour  Intake 780.86 ml  Output 1775 ml  Net -994.14 ml     Assessment/Plan:  66 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 I agree with the above.  Demetruis was examined at bedside and found to be asymptomatic Follow-up CT reviewed  demonstrating no appreciable size change in intramural hematoma.  This appears to be from zone 2 to zone 5 of the aorta. The current depth of hematoma is less than 11 mm.  Greater than 11 mm is associated with a high risk of aneurysmal degeneration. Raylin will be best treated with aggressive medical management and anti-impulse control, being normal heart rate and blood pressure less than 120 mmHg.  Acute surgery in this patient population is suboptimal due to the high risk of retrograde dissection. I will see him in 1 month with repeat CT to assess for progression.   I had a long discussion with Brayln - should his back pain or chest pain return, it would likely result in surgery-likely carotid subclavian bypass, thoracic endovascular aortic repair.   Cassandria Santee, MD [facility] Phone Number: [phone] 05/14/2021 2:07 PM

## 2021-05-15 DIAGNOSIS — I7102 Dissection of abdominal aorta: Secondary | ICD-10-CM | POA: Diagnosis not present

## 2021-05-15 LAB — CBC
HCT: 35.5 % — ABNORMAL LOW (ref 39.0–52.0)
Hemoglobin: 11.5 g/dL — ABNORMAL LOW (ref 13.0–17.0)
MCH: 27.6 pg (ref 26.0–34.0)
MCHC: 32.4 g/dL (ref 30.0–36.0)
MCV: 85.1 fL (ref 80.0–100.0)
Platelets: 273 10*3/uL (ref 150–400)
RBC: 4.17 MIL/uL — ABNORMAL LOW (ref 4.22–5.81)
RDW: 17.3 % — ABNORMAL HIGH (ref 11.5–15.5)
WBC: 14.3 10*3/uL — ABNORMAL HIGH (ref 4.0–10.5)
nRBC: 0 % (ref 0.0–0.2)

## 2021-05-15 LAB — BASIC METABOLIC PANEL
Anion gap: 8 (ref 5–15)
BUN: 21 mg/dL (ref 8–23)
CO2: 24 mmol/L (ref 22–32)
Calcium: 8.9 mg/dL (ref 8.9–10.3)
Chloride: 106 mmol/L (ref 98–111)
Creatinine, Ser: 1.24 mg/dL (ref 0.61–1.24)
GFR, Estimated: >60 mL/min (ref >60)
Glucose, Bld: 103 mg/dL — ABNORMAL HIGH (ref 70–99)
Potassium: 3.9 mmol/L (ref 3.5–5.1)
Sodium: 138 mmol/L (ref 135–145)

## 2021-05-15 LAB — HEPATIC FUNCTION PANEL
ALT: 46 U/L — ABNORMAL HIGH (ref 0–44)
AST: 25 U/L (ref 15–41)
Albumin: 3.2 g/dL — ABNORMAL LOW (ref 3.5–5.0)
Alkaline Phosphatase: 44 U/L (ref 38–126)
Bilirubin, Direct: 0.2 mg/dL (ref 0.0–0.2)
Indirect Bilirubin: 0.8 mg/dL (ref 0.3–0.9)
Total Bilirubin: 1 mg/dL (ref 0.3–1.2)
Total Protein: 5.9 g/dL — ABNORMAL LOW (ref 6.5–8.1)

## 2021-05-15 LAB — PROCALCITONIN: Procalcitonin: 0.59 ng/mL

## 2021-05-15 LAB — MAGNESIUM: Magnesium: 2.7 mg/dL — ABNORMAL HIGH (ref 1.7–2.4)

## 2021-05-15 LAB — PHOSPHORUS: Phosphorus: 3 mg/dL (ref 2.5–4.6)

## 2021-05-15 NOTE — Progress Notes (Signed)
°  Progress Note    05/15/2021 10:12 AM Hospital Day 2  Subjective:  denies chest or back pain.  Says he has been up and had a sponge bath.  Looking forward to shower.   Tm 99.3 now afebrile HR 70's-90's  46'N-629'B systolic 28% RA  Vitals:   05/15/21 0715 05/15/21 0800  BP:  93/74  Pulse:  90  Resp:  17  Temp: (!) 101.2 F (38.4 C)   SpO2:  92%    Physical Exam: Cardiac:  regular Lungs:  non labored Abdomen:  soft, NT Extremities:  easily palpable bilateral radial and DP pulses.   CBC    Component Value Date/Time   WBC 14.3 (H) 05/15/2021 0134   RBC 4.17 (L) 05/15/2021 0134   HGB 11.5 (L) 05/15/2021 0134   HCT 35.5 (L) 05/15/2021 0134   PLT 273 05/15/2021 0134   MCV 85.1 05/15/2021 0134   MCH 27.6 05/15/2021 0134   MCHC 32.4 05/15/2021 0134   RDW 17.3 (H) 05/15/2021 0134   LYMPHSABS 1.8 05/13/2021 0548   MONOABS 1.0 05/13/2021 0548   EOSABS 0.0 05/13/2021 0548   BASOSABS 0.0 05/13/2021 0548    BMET    Component Value Date/Time   NA 138 05/15/2021 0134   K 3.9 05/15/2021 0134   CL 106 05/15/2021 0134   CO2 24 05/15/2021 0134   GLUCOSE 103 (H) 05/15/2021 0134   BUN 21 05/15/2021 0134   CREATININE 1.24 05/15/2021 0134   CALCIUM 8.9 05/15/2021 0134   GFRNONAA >60 05/15/2021 0134   GFRAA 74 (L) 07/07/2012 0525    INR    Component Value Date/Time   INR 1.2 05/13/2021 0548     Intake/Output Summary (Last 24 hours) at 05/15/2021 1012 Last data filed at 05/14/2021 1900 Gross per 24 hour  Intake 505.42 ml  Output 400 ml  Net 105.42 ml      Assessment/Plan:  66 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 -BP well controlled -he will f/u with Dr. Virl Cagey in 1 month with CTA.     Leontine Locket, PA-C [facility] [phone] 05/15/2021 10:12 AM  VASCULAR STAFF ADDENDUM: I have independently interviewed and examined the patient. I agree with the above.  Pt doing  well this morning. No pain. Will follow up in the office Appreciate ICU and hospital medicine involvement.   Cassandria Santee, MD [facility] Phone Number: [phone] 05/15/2021 10:12 AM

## 2021-05-15 NOTE — Progress Notes (Signed)
PCCM INTERVAL PROGRESS NOTE   Patient desaturated to 86% on ambulatory saturation assessment while on room air. Referral for home oxygen has been placed and once this is obtained we will plan to discharge.    Georgann Housekeeper, AGACNP-BC [facility]  See Amion for personal pager PCCM on call pager [phone] until 7pm. Please call Elink 7p-7a. [phone]  05/15/2021 3:50 PM

## 2021-05-15 NOTE — Progress Notes (Signed)
SATURATION QUALIFICATIONS: (This note is used to comply with regulatory documentation for home oxygen)  Patient Saturations on Room Air at Rest = 93%  Patient Saturations on Room Air while Ambulating = 86%  Patient Saturations on 2 Liters of oxygen while Ambulating = 92%  Please briefly explain why patient needs home oxygen: While ambulating without supplemental O2 today at 1230, patient became short of breath and O2 sat dropped to 86%. Patient stood still, took slow deep breaths to regain O2 levels, and began walking again.

## 2021-05-15 NOTE — Progress Notes (Addendum)
° ° ° °  NAME:  Jimmy Marshall, MRN:  [id number], DOB:  10/01/1955, LOS: 3 ADMISSION DATE:  05/12/2021, CONSULTATION DATE:  05/12/21 REFERRING MD:  Regenia Skeeter, CHIEF COMPLAINT:  Abdominal pain, chest pain   History of Present Illness:  66 yo man with a history of here with abdominal pain and chest pain.   On Arrival, chest pain, ashen, diaphoretic.  On day of admission he donated plasma. Took cialis x 2 today around noon.  Chest pain started at 1220.  Also took cocaine that morning.   He said he does cocaine rarely and only took "a small amount". CTA with type B dissection. Vascular recommending medical management. PCCM admitted. UDS cocaine and THC  Pertinent  Medical History  GERD DM Glaucoma HTN Schizoaffective d/o.  Hx of thyroglossal cyst, s/p excision 2013  Significant Hospital Events: Including procedures, antibiotic start and stop dates in addition to other pertinent events   2/6 admitted with type B dissection.  2/8 unable to wean off NTG despite orals. Repeat CT did not warrant intervention.  2/9 off NTG infusion.   Interim History / Subjective:  Hiccups. No acute events overnight. Off NTG drip.   Objective   Blood pressure 104/63, pulse 82, temperature 99.3 F (37.4 C), temperature source Oral, resp. rate 20, height 5\' 11"  (1.803 m), weight 85.9 kg, SpO2 95 %.        Intake/Output Summary (Last 24 hours) at 05/15/2021 5188 Last data filed at 05/14/2021 1900 Gross per 24 hour  Intake 806.09 ml  Output 400 ml  Net 406.09 ml    Filed Weights   05/12/21 1427 05/13/21 0515 05/14/21 0500  Weight: 91 kg 86 kg 85.9 kg    Examination:   Constitutional: middle aged male in NAD Eyes: PERRL Ears, nose, mouth, and throat: MMM, trachea midline.  Cardiovascular: RRR, rate 90, no MRG Respiratory: Clear bilateral breath sounds. No distress.  Gastrointestinal: Soft, non-tender, non-distended Skin: Grossly intact.  Neurologic: Alert, oriented, non-focal.   Significant labs reviewed  by me Mag 2.7, WBC 14 CT angio 2/9  reviewed by me: aortic intramural hematoma vs. Clotted dissection now with some blood pooling that does not warrant immediate intervention.   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 Medical management.   R/o sepsis (fevers, white count. May simply be a result of the dissection. PCT 0.5) - Blood cx pending - No role for antibiotics at this time.   Distant hx of alcohol abuse - CIWA DC  Question baseline psych disorder Hx DM2, no longer has this, A1c 5% -  monitor  Will plan to transfer out of ICU today.   Best Practice (right click and "Reselect all SmartList Selections" daily)   Diet/type: Regular consistency (see orders) DVT prophylaxis: LMWH GI prophylaxis: PPI Lines: N/A Foley:  N/A Code Status:  full code Last date of multidisciplinary goals of care discussion [pending]    Georgann Housekeeper, AGACNP-BC [place] for personal pager PCCM on call pager [phone] until 7pm. Please call Elink 7p-7a. [phone]  05/15/2021 7:12 AM

## 2021-05-15 NOTE — Discharge Summary (Incomplete)
Physician Discharge Summary       Patient ID: Jimmy Marshall MRN: [id number] DOB/AGE: 11/18/1955 66 y.o.  Admit date: 05/12/2021 Discharge date: 05/15/2021  Discharge Diagnoses:  Principal Problem:   Aortic dissection (HCC)   History of Present Illness/Hospital Course:  66 year old male with past medical history significant for diabetes, hypertension, schizoaffective disorder, and GERD.  He presented most emergency department on 2/6 with complaints of chest and abdominal pain.  Upon arrival to the ED he was noted to be ashen and diaphoretic.  Patient reported that earlier in the day he donated plasma, took 2 Cialis, and took cocaine at some point the morning.  He was hypertensive with systolic blood pressure 025.  Due to complaints of chest pain with hypertension and cocaine use he was sent for CT angiogram of the chest which demonstrated type B aortic dissection.  He was seen by vascular surgery in the emergency department who recommended medical management.  He was initially started on nitroglycerin infusion for blood pressure control.  Oral agents were started as well.  It was difficult for him to liberate from nitroglycerin infusion after multiple additions and up titrations of oral antihypertensives.  As of 2/8 he was able to come off the nitroglycerin infusion.  Repeat CT on 2/8 also demonstrated insignificant change.  Course complicated by fevers however no other infectious findings.  Fevers felt to be secondary to aortic dissection.  On 2/9 after vascular surgery evaluation he was deemed a candidate for discharge.      Discharge Plan by active problems   Hypertensive emergency with Type B dissection in setting of active cocaine use; symptoms seem improved; renal function preserved - Continue amlodipine, hydralazine, losartan, coreg - Follow-up with vascular surgery Dr. Virl Cagey in 1 month.  Appointment arranged.  Hypoxia: Patient no distress on room air with sats in the low 90s,  however, desaturated to 86% briefly with ambulation. -Discharged on 2 L home oxygen based on ambulatory desaturation assessment. -Have arranged 1 week follow-up in the pulmonary clinic.  Substance abuse: -Abstain from any agents known to increase blood pressure, specifically cocaine.    Significant Hospital tests/ studies   CTA on admission: Type 3 aortic dissection extending from the left subclavian artery to the renal veins with thrombosis of a small false lumen with stenosis of the true lumen.The dissection is involving the upper right renal artery and the left renal artery with patency of both the true and false lumens. Small amount of pericardial fluid superiorly extending to the beginning of the dissection.  Consults  Vascular Surgery  Discharge Exam: BP 119/74    Pulse 86    Temp (!) 102.6 F (39.2 C) (Oral)    Resp (!) 22    Ht 5\' 11"  (1.803 m)    Wt 85.9 kg    SpO2 94%    BMI 26.41 kg/m   Constitutional: middle aged male in NAD Eyes: PERRL Ears, nose, mouth, and throat: MMM, trachea midline.  Cardiovascular: RRR, rate 90, no MRG Respiratory: Clear bilateral breath sounds. No distress.  Gastrointestinal: Soft, non-tender, non-distended Skin: Grossly intact.  Neurologic: Alert, oriented, non-focal.   Labs at discharge Lab Results  Component Value Date   CREATININE 1.24 05/15/2021   BUN 21 05/15/2021   NA 138 05/15/2021   K 3.9 05/15/2021   CL 106 05/15/2021   CO2 24 05/15/2021   Lab Results  Component Value Date   WBC 14.3 (H) 05/15/2021   HGB 11.5 (L) 05/15/2021   HCT 35.5 (  L) 05/15/2021   MCV 85.1 05/15/2021   PLT 273 05/15/2021   Lab Results  Component Value Date   ALT 46 (H) 05/15/2021   AST 25 05/15/2021   ALKPHOS 44 05/15/2021   BILITOT 1.0 05/15/2021   Lab Results  Component Value Date   INR 1.2 05/13/2021   INR 1.02 07/05/2012    Current radiology studies CT Angio Chest/Abd/Pel for Dissection W and/or W/WO  Result Date: 05/14/2021 CLINICAL  DATA:  Aortic dissection, considering treatment change EXAM: CT ANGIOGRAPHY CHEST, ABDOMEN AND PELVIS TECHNIQUE: Non-contrast CT of the chest was initially obtained. Multidetector CT imaging through the chest, abdomen and pelvis was performed using the standard protocol during bolus administration of intravenous contrast. Multiplanar reconstructed images and MIPs were obtained and reviewed to evaluate the vascular anatomy. RADIATION DOSE REDUCTION: This exam was performed according to the departmental dose-optimization program which includes automated exposure control, adjustment of the mA and/or kV according to patient size and/or use of iterative reconstruction technique. CONTRAST:  158mL OMNIPAQUE IOHEXOL 350 MG/ML SOLN COMPARISON:  None. FINDINGS: CTA CHEST FINDINGS Cardiovascular: The ascending aorta measures 4.1 cm in maximum diameter, unchanged. Extensive intramural hematoma extending from the aortic arch near the brachiocephalic origin throughout the descending thoracic aorta and into the abdomen to the level of the celiac axis and right renal artery. Point of clarification: This was previously described as an aortic dissection, however the abnormality is visible noncontrast CT and therefore favored to represent an intramural hematoma rather than dissection with a completely thrombosed false lumen. There is newly visible contrast pulling at the medial aspect of the distal aortic arch measuring 6 mm in width (series 6, image 57). There is a likely feeding intercostal artery. Additional tiny probable blood pole at aortic hiatus of the diaphragm with feeding intercostal artery (series 6, image 137). Normal cardiac size. Trace pericardial effusion. No evidence of pulmonary embolism. Mediastinum/Nodes: No lymphadenopathy. The thyroid is unremarkable. The esophagus is unremarkable. The trachea is unremarkable. Lungs/Pleura: Right middle lobe collapse. Lingular and left basilar subsegmental atelectasis. Right  basilar scarring/linear atelectasis. No suspicious pulmonary nodules or masses. Musculoskeletal: No acute osseous abnormality. No suspicious lytic or blastic lesions. Multilevel degenerative changes of the spine. Review of the MIP images confirms the above findings. CTA ABDOMEN AND PELVIS FINDINGS VASCULAR Aorta: Extension of the intramural hematoma or dissection with thrombosed false lumen to the level of the celiac axis and right renal artery. The true lumen is widely patent. Celiac: Patent without dissection or stenosis. SMA: Patent without dissection or stenosis. Renals: Patent without stenosis. Decreased conspicuity of the linear defect seen on prior CTA. IMA: Moderate ostial stenosis. Inflow: There is ectasias of the left common iliac artery measuring up to 2.1 cm. No significant stenosis. No dissection. Veins: No obvious venous abnormality within the limitations of this arterial phase study. Review of the MIP images confirms the above findings. NON-VASCULAR Hepatobiliary: Unchanged multiple hepatic cysts. The gallbladder is unremarkable. Pancreas: Unremarkable. No pancreatic ductal dilatation or surrounding inflammatory changes. Spleen: There are multiple enhancing lesions in the spleen following blood pool consistent with hemangiomas. Adrenals/Urinary Tract: Unchanged bilateral adrenal thickening and 5 mm low density left adrenal nodule statistically likely to be an adenoma. Stomach/Bowel: The stomach is within normal limits. There is no evidence of bowel obstruction. The appendix is normal. Colonic diverticulosis. No evidence of diverticulitis. Lymphatic: No lymphadenopathy. Reproductive: Unremarkable. Other: Small fat containing inguinal hernias bilaterally. No bowel containing hernia. No ascites. No free air. Musculoskeletal: No acute osseous abnormality.  No suspicious lytic or blastic lesions. Multilevel degenerative changes of the spine, severe at L5-S1. Review of the MIP images confirms the above  findings. IMPRESSION: Type B intramural hematoma with arch involvement extending from the brachiocephalic takeoff and distally into the abdomen to the level of the celiac and right renal artery, unchanged in extent from recent CTA. Newly visible contrast pooling in the medial aspect of the distal aortic arch measuring 6 mm, with visible feeding intercostal artery and additional tiny area at the aortic hiatus of the diaphragm medially. Given visible feeding vessels, these are favored to represent intramural blood pools and less likely ulcer like projections. Recommend short-term follow-up CT angiogram to ensure no progression to dissection. Unchanged mildly dilated ascending aorta measuring 4.1 cm, which can be reassessed on follow-up exam, at least annually. Right middle lobe collapse. These results were called by telephone at the time of interpretation on 05/14/2021 at 1:22 pm to provider Dr. Erskine Emery, who verbally acknowledged these results. Electronically Signed   By: Maurine Simmering M.D.   On: 05/14/2021 13:32    Disposition:     Allergies as of 05/15/2021   No Known Allergies   Med Rec must be completed prior to using this [facility]***        Durable Medical Equipment  (From admission, onward)           Start     Ordered   05/15/21 1320  For home use only DME oxygen  Once       Question Answer Comment  Length of Need 6 Months   Mode or (Route) Nasal cannula   Liters per Minute 2   Frequency Continuous (stationary and portable oxygen unit needed)   Oxygen delivery system Gas      05/15/21 1321            Follow-up Information     Hunsucker, Bonna Gains, MD Follow up on 05/27/2021.   Specialty: Pulmonary Disease Why: 3:30 Contact information: [address] [address] [phone]         Broadus John, MD Follow up on 06/13/2021.   Specialty: Vascular Surgery Why: 2:20 PM Contact information: [place]  [address] [phone]         [facility] Follow up.   Why: Oxygen to be delivered to the room prior to transition home. Contact information: [place] [address] [phone]                 Discharged Condition: {condition:18240}  *** minutes of time have been dedicated to discharge assessment, planning and discharge instructions.   Signed:  Georgann Housekeeper, AGACNP-BC [place] [facility]  See Amion for personal pager PCCM on call pager [phone] until 7pm. Please call Elink 7p-7a. [phone]  05/15/2021 4:47 PM

## 2021-05-15 NOTE — Care Management (Signed)
1630 05-15-21 Case Manager received consult for home oxygen. Case Manager spoke with the patient and he is agreeable to the referral being submitted to [employer]. Case Manager asked RN to place the pulmonary note in Epic for insurance purposes. Referral made and [employer] will deliver the oxygen to the room prior to transition home. No further needs identified by Case Manager at this time.

## 2021-05-15 NOTE — Progress Notes (Signed)
°  Progress Note    05/15/2021 7:17 AM Hospital Day 2  Subjective:  denies chest or back pain.  Says he has been up and had a sponge bath.  Looking forward to shower.   Tm 99.3 now afebrile HR 70's-90's  41'L-244'W systolic 10% RA  Vitals:   05/15/21 0600 05/15/21 0715  BP: 104/63   Pulse: 82   Resp: 20   Temp:  98.2 F (36.8 C)  SpO2: 95%     Physical Exam: Cardiac:  regular Lungs:  non labored Abdomen:  soft, NT Extremities:  easily palpable bilateral radial and DP pulses.   CBC    Component Value Date/Time   WBC 14.3 (H) 05/15/2021 0134   RBC 4.17 (L) 05/15/2021 0134   HGB 11.5 (L) 05/15/2021 0134   HCT 35.5 (L) 05/15/2021 0134   PLT 273 05/15/2021 0134   MCV 85.1 05/15/2021 0134   MCH 27.6 05/15/2021 0134   MCHC 32.4 05/15/2021 0134   RDW 17.3 (H) 05/15/2021 0134   LYMPHSABS 1.8 05/13/2021 0548   MONOABS 1.0 05/13/2021 0548   EOSABS 0.0 05/13/2021 0548   BASOSABS 0.0 05/13/2021 0548    BMET    Component Value Date/Time   NA 138 05/15/2021 0134   K 3.9 05/15/2021 0134   CL 106 05/15/2021 0134   CO2 24 05/15/2021 0134   GLUCOSE 103 (H) 05/15/2021 0134   BUN 21 05/15/2021 0134   CREATININE 1.24 05/15/2021 0134   CALCIUM 8.9 05/15/2021 0134   GFRNONAA >60 05/15/2021 0134   GFRAA 74 (L) 07/07/2012 0525    INR    Component Value Date/Time   INR 1.2 05/13/2021 0548     Intake/Output Summary (Last 24 hours) at 05/15/2021 0717 Last data filed at 05/14/2021 1900 Gross per 24 hour  Intake 806.09 ml  Output 400 ml  Net 406.09 ml     Assessment/Plan:  66 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 -BP well controlled -he will f/u with Dr. Virl Cagey in 1 month with CTA.     Leontine Locket, PA-C [facility] [phone] 05/15/2021 7:17 AM

## 2021-05-16 DIAGNOSIS — I7102 Dissection of abdominal aorta: Secondary | ICD-10-CM

## 2021-05-16 LAB — HEPATIC FUNCTION PANEL
ALT: 39 U/L (ref 0–44)
AST: 21 U/L (ref 15–41)
Albumin: 3.1 g/dL — ABNORMAL LOW (ref 3.5–5.0)
Alkaline Phosphatase: 55 U/L (ref 38–126)
Bilirubin, Direct: 0.2 mg/dL (ref 0.0–0.2)
Indirect Bilirubin: 0.6 mg/dL (ref 0.3–0.9)
Total Bilirubin: 0.8 mg/dL (ref 0.3–1.2)
Total Protein: 5.9 g/dL — ABNORMAL LOW (ref 6.5–8.1)

## 2021-05-16 LAB — BASIC METABOLIC PANEL
Anion gap: 10 (ref 5–15)
BUN: 22 mg/dL (ref 8–23)
CO2: 24 mmol/L (ref 22–32)
Calcium: 8.6 mg/dL — ABNORMAL LOW (ref 8.9–10.3)
Chloride: 103 mmol/L (ref 98–111)
Creatinine, Ser: 1.28 mg/dL — ABNORMAL HIGH (ref 0.61–1.24)
GFR, Estimated: >60 mL/min (ref >60)
Glucose, Bld: 138 mg/dL — ABNORMAL HIGH (ref 70–99)
Potassium: 3.4 mmol/L — ABNORMAL LOW (ref 3.5–5.1)
Sodium: 137 mmol/L (ref 135–145)

## 2021-05-16 LAB — CBC
HCT: 35.2 % — ABNORMAL LOW (ref 39.0–52.0)
Hemoglobin: 11.3 g/dL — ABNORMAL LOW (ref 13.0–17.0)
MCH: 27.4 pg (ref 26.0–34.0)
MCHC: 32.1 g/dL (ref 30.0–36.0)
MCV: 85.2 fL (ref 80.0–100.0)
Platelets: 266 10*3/uL (ref 150–400)
RBC: 4.13 MIL/uL — ABNORMAL LOW (ref 4.22–5.81)
RDW: 17.4 % — ABNORMAL HIGH (ref 11.5–15.5)
WBC: 11.7 10*3/uL — ABNORMAL HIGH (ref 4.0–10.5)
nRBC: 0 % (ref 0.0–0.2)

## 2021-05-16 LAB — PHOSPHORUS: Phosphorus: 3.6 mg/dL (ref 2.5–4.6)

## 2021-05-16 LAB — MAGNESIUM: Magnesium: 2.8 mg/dL — ABNORMAL HIGH (ref 1.7–2.4)

## 2021-05-16 MED ORDER — CARVEDILOL 12.5 MG PO TABS: 12.5000 mg | ORAL_TABLET | Freq: Two times a day (BID) | ORAL | 3 refills | Status: DC

## 2021-05-16 MED ORDER — HYDRALAZINE HCL 25 MG PO TABS
25.0000 mg | ORAL_TABLET | Freq: Three times a day (TID) | ORAL | 3 refills | Status: DC
Start: 2021-05-16 — End: 2021-12-22

## 2021-05-16 NOTE — Progress Notes (Deleted)
°  Progress Note    05/16/2021 7:52 AM Hospital Day 2  Subjective:  denies chest or back pain. Hiccups   Tm 99.3 now afebrile HR 70's-90's  62'E-366'Q systolic 94% RA  Vitals:   05/16/21 0139 05/16/21 0418  BP: 115/72 (!) 110/59  Pulse: 78 81  Resp: 17 (!) 21  Temp: 100.1 F (37.8 C) 98.4 F (36.9 C)  SpO2: 97% 94%    Physical Exam: Cardiac:  regular Lungs:  non labored Abdomen:  soft, NT Extremities:  easily palpable bilateral radial and DP pulses.   CBC    Component Value Date/Time   WBC 11.7 (H) 05/16/2021 0357   RBC 4.13 (L) 05/16/2021 0357   HGB 11.3 (L) 05/16/2021 0357   HCT 35.2 (L) 05/16/2021 0357   PLT 266 05/16/2021 0357   MCV 85.2 05/16/2021 0357   MCH 27.4 05/16/2021 0357   MCHC 32.1 05/16/2021 0357   RDW 17.4 (H) 05/16/2021 0357   LYMPHSABS 1.8 05/13/2021 0548   MONOABS 1.0 05/13/2021 0548   EOSABS 0.0 05/13/2021 0548   BASOSABS 0.0 05/13/2021 0548    BMET    Component Value Date/Time   NA 137 05/16/2021 0357   K 3.4 (L) 05/16/2021 0357   CL 103 05/16/2021 0357   CO2 24 05/16/2021 0357   GLUCOSE 138 (H) 05/16/2021 0357   BUN 22 05/16/2021 0357   CREATININE 1.28 (H) 05/16/2021 0357   CALCIUM 8.6 (L) 05/16/2021 0357   GFRNONAA >60 05/16/2021 0357   GFRAA 74 (L) 07/07/2012 0525    INR    Component Value Date/Time   INR 1.2 05/13/2021 0548     Intake/Output Summary (Last 24 hours) at 05/16/2021 0752 Last data filed at 05/16/2021 0719 Gross per 24 hour  Intake 720 ml  Output 200 ml  Net 520 ml      Assessment/Plan:  66 y.o. male type B aortic dissection Hospital Day 3  -pt doing well this morning without back or chest pain. -BP well controlled -he will f/u with me in 1 month with CTA.     Jimmy Marshall  [facility] [phone] 05/16/2021 7:52 AM

## 2021-05-16 NOTE — Discharge Summary (Signed)
Physician Discharge Summary  Patient ID: Jimmy Marshall MRN: [id number] DOB/AGE: 1956/01/17 66 y.o.  Admit date: 05/12/2021 Discharge date: 05/16/2021  Problem List Principal Problem:   Aortic dissection (HCC)  HPI: 66 yo man with a history of here with abdominal pain and chest pain.   On Arrival, chest pain, ashen, diaphoretic.     Today donated plasma.  Took cialis x 2 today around noon.   Chest pain started at 1220.   Also took cocaine this morning.   He said he does cocaine rarely and only took "a small amount" .    He tells me his pain is mostly resolved, still at about 10 percent of what it was.  He has been feeling nauseous.  He is complaining of hunger and thirst.  Did not take his po meds today.    In ed: episode of emesis.   WBC slightly elevated from BL>  Utox pos for cocaine and THC ASA, fentanyl 50, ativan 1gm x 2 , zofran, thiamine given.    Seen by Vascular surgery.  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 Medical management.    R/o sepsis (fevers, white count. May simply be a result of the dissection. PCT 0.5) - Blood cx pending - No role for antibiotics at this time.    Distant hx of alcohol abuse - CIWA DC   Question baseline psych disorder Hx DM2, no longer has this, A1c 5% CBG (last 3)  No results for input(s): GLUCAP in the last 72 hours.   -  monitor   Will plan to dc home today.   Best Practice (right click and "Reselect all SmartList Selections" daily)    Diet/type: Regular consistency (see orders) DVT prophylaxis: LMWH GI prophylaxis: PPI Lines: N/A Foley:  N/A Code Status:  full code Last date of multidisciplinary goals of care discussion [pending]     Labs  at discharge Lab Results  Component Value Date   CREATININE 1.28 (H) 05/16/2021   BUN 22 05/16/2021   NA 137 05/16/2021   K 3.4 (L) 05/16/2021   CL 103 05/16/2021   CO2 24 05/16/2021   Lab Results  Component Value Date   WBC 11.7 (H) 05/16/2021   HGB 11.3 (L) 05/16/2021   HCT 35.2 (L) 05/16/2021   MCV 85.2 05/16/2021   PLT 266 05/16/2021   Lab Results  Component Value Date   ALT 39 05/16/2021   AST 21 05/16/2021   ALKPHOS 55 05/16/2021   BILITOT 0.8 05/16/2021   Lab Results  Component Value Date   INR 1.2 05/13/2021   INR 1.02 07/05/2012    Current radiology studies CT Angio Chest/Abd/Pel for Dissection W and/or W/WO  Result Date: 05/14/2021 CLINICAL DATA:  Aortic dissection, considering treatment change EXAM: CT ANGIOGRAPHY CHEST, ABDOMEN AND PELVIS TECHNIQUE: Non-contrast CT of the chest was initially obtained. Multidetector CT imaging through the chest, abdomen and pelvis was performed using the standard protocol during bolus administration of intravenous contrast. Multiplanar reconstructed images and MIPs were obtained and reviewed to evaluate the vascular anatomy. RADIATION DOSE REDUCTION: This exam was performed according to the departmental dose-optimization program which includes automated exposure control, adjustment of the mA and/or kV according to patient size and/or use of iterative reconstruction technique.  CONTRAST:  115mL OMNIPAQUE IOHEXOL 350 MG/ML SOLN COMPARISON:  None. FINDINGS: CTA CHEST FINDINGS Cardiovascular: The ascending aorta measures 4.1 cm in maximum diameter, unchanged. Extensive intramural hematoma extending from the aortic arch near the brachiocephalic origin throughout the descending thoracic aorta and into the abdomen to the level of the celiac axis and right renal artery. Point of clarification: This was previously described as an aortic dissection, however the abnormality is visible noncontrast CT and therefore favored to represent an intramural  hematoma rather than dissection with a completely thrombosed false lumen. There is newly visible contrast pulling at the medial aspect of the distal aortic arch measuring 6 mm in width (series 6, image 57). There is a likely feeding intercostal artery. Additional tiny probable blood pole at aortic hiatus of the diaphragm with feeding intercostal artery (series 6, image 137). Normal cardiac size. Trace pericardial effusion. No evidence of pulmonary embolism. Mediastinum/Nodes: No lymphadenopathy. The thyroid is unremarkable. The esophagus is unremarkable. The trachea is unremarkable. Lungs/Pleura: Right middle lobe collapse. Lingular and left basilar subsegmental atelectasis. Right basilar scarring/linear atelectasis. No suspicious pulmonary nodules or masses. Musculoskeletal: No acute osseous abnormality. No suspicious lytic or blastic lesions. Multilevel degenerative changes of the spine. Review of the MIP images confirms the above findings. CTA ABDOMEN AND PELVIS FINDINGS VASCULAR Aorta: Extension of the intramural hematoma or dissection with thrombosed false lumen to the level of the celiac axis and right renal artery. The true lumen is widely patent. Celiac: Patent without dissection or stenosis. SMA: Patent without dissection or stenosis. Renals: Patent without stenosis. Decreased conspicuity of the linear defect seen on prior CTA. IMA: Moderate ostial stenosis. Inflow: There is ectasias of the left common iliac artery measuring up to 2.1 cm. No significant stenosis. No dissection. Veins: No obvious venous abnormality within the limitations of this arterial phase study. Review of the MIP images confirms the above findings. NON-VASCULAR Hepatobiliary: Unchanged multiple hepatic cysts. The gallbladder is unremarkable. Pancreas: Unremarkable. No pancreatic ductal dilatation or surrounding inflammatory changes. Spleen: There are multiple enhancing lesions in the spleen following blood pool consistent with  hemangiomas. Adrenals/Urinary Tract: Unchanged bilateral adrenal thickening and 5 mm low density left adrenal nodule statistically likely to be an adenoma. Stomach/Bowel: The stomach is within normal limits. There is no evidence of bowel obstruction. The appendix is normal. Colonic diverticulosis. No evidence of diverticulitis. Lymphatic: No lymphadenopathy. Reproductive: Unremarkable. Other: Small fat containing inguinal hernias bilaterally. No bowel containing hernia. No ascites. No free air. Musculoskeletal: No acute osseous abnormality. No suspicious lytic or blastic lesions. Multilevel degenerative changes of the spine, severe at L5-S1. Review of the MIP images confirms the above findings. IMPRESSION: Type B intramural hematoma with arch involvement extending from the brachiocephalic takeoff and distally into the abdomen to the level of the celiac and right renal artery, unchanged in extent from recent CTA. Newly visible contrast pooling in the medial aspect of the distal aortic arch measuring 6 mm, with visible feeding intercostal artery and additional tiny area at the aortic hiatus of the diaphragm medially. Given visible feeding vessels, these are favored to represent intramural blood pools and less likely ulcer like projections. Recommend short-term follow-up CT angiogram to ensure no progression to dissection. Unchanged mildly dilated ascending aorta measuring 4.1 cm, which can be reassessed on follow-up exam, at least annually. Right middle lobe collapse. These results were called by telephone at the time of interpretation on 05/14/2021 at 1:22 pm to provider Dr. Erskine Emery, who verbally acknowledged these results. Electronically Signed  By: Maurine Simmering M.D.   On: 05/14/2021 13:32    Disposition:  Discharge disposition: 01-Home or Self Care        Allergies as of 05/16/2021   No Known Allergies      Medication List     STOP taking these medications    cyclobenzaprine 10 MG  tablet Commonly known as: FLEXERIL   ibuprofen 800 MG tablet Commonly known as: ADVIL   metoprolol tartrate 25 MG tablet Commonly known as: LOPRESSOR   sildenafil 20 MG tablet Commonly known as: REVATIO       TAKE these medications    amLODipine 10 MG tablet Commonly known as: NORVASC Take 10 mg by mouth daily. What changed: Another medication with the same name was removed. Continue taking this medication, and follow the directions you see here.   Aspirin Low Dose 81 MG EC tablet Generic drug: aspirin Take 81 mg by mouth daily.   atenolol 25 MG tablet Commonly known as: TENORMIN Take 1 tablet (25 mg total) by mouth daily.   atorvastatin 40 MG tablet Commonly known as: LIPITOR Take 40 mg by mouth daily.   carvedilol 12.5 MG tablet Commonly known as: COREG Take 1 tablet (12.5 mg total) by mouth 2 (two) times daily with a meal.   hydrALAZINE 25 MG tablet Commonly known as: APRESOLINE Take 1 tablet (25 mg total) by mouth every 8 (eight) hours.   hydrOXYzine 50 MG tablet Commonly known as: ATARAX Take 1 tablet (50 mg total) by mouth at bedtime as needed for anxiety (sleep).   insulin glargine 100 UNIT/ML injection Commonly known as: LANTUS 10 units   losartan 50 MG tablet Commonly known as: COZAAR Take 50 mg by mouth daily.   metFORMIN 500 MG tablet Commonly known as: GLUCOPHAGE Take 500 mg by mouth daily with breakfast.   mirtazapine 15 MG tablet Commonly known as: REMERON Take 15 mg by mouth at bedtime.   multivitamin with minerals Tabs tablet Take 1 tablet by mouth daily.   OLANZapine 15 MG tablet Commonly known as: ZYPREXA Take 1 tablet (15 mg total) by mouth at bedtime.   omeprazole 20 MG capsule Commonly known as: PRILOSEC Take 20 mg by mouth daily.   tadalafil 20 MG tablet Commonly known as: CIALIS Take 20 mg by mouth daily as needed for erectile dysfunction.   timolol 0.5 % ophthalmic solution Commonly known as: BETIMOL Place 1 drop  into both eyes 2 (two) times daily.   traMADol 50 MG tablet Commonly known as: ULTRAM Take 1 tablet (50 mg total) by mouth every 6 (six) hours as needed.   vitamin C 250 MG tablet Commonly known as: ASCORBIC ACID Take 250 mg by mouth daily.               Durable Medical Equipment  (From admission, onward)           Start     Ordered   05/15/21 1320  For home use only DME oxygen  Once       Question Answer Comment  Length of Need 6 Months   Mode or (Route) Nasal cannula   Liters per Minute 2   Frequency Continuous (stationary and portable oxygen unit needed)   Oxygen delivery system Gas      05/15/21 1321            Follow-up Information     Hunsucker, Bonna Gains, MD Follow up on 05/27/2021.   Specialty: Pulmonary Disease Why: 3:30 Contact information:  [address] [place] [address] [phone]         Broadus John, MD Follow up on 06/13/2021.   Specialty: Vascular Surgery Why: 2:20 PM Contact information: [place] [address] [phone]         [facility] Follow up.   Why: Oxygen to be delivered to the room prior to transition home. Contact information: [address] [place] [address] [phone]         Jimmy Mccreedy, MD Follow up.   Specialty: Internal Medicine Why: Please follow up in a week. Contact information: [address] [place] [address] [phone]                  Discharged Condition: fair  Time spent on discharge greater than 40 minutes.  Vital signs at Discharge. Temp:  [98.4 F (36.9 C)-102.6 F (39.2 C)] 98.4 F (36.9 C) (02/10 0418) Pulse Rate:  [76-108] 81 (02/10 0418) Resp:  [11-32] 21 (02/10 0418) BP: (83-119)/(59-85) 110/59 (02/10 0418) SpO2:  [86 %-97 %] 94 % (02/10 0418) Weight:  [86.8 kg] 86.8 kg (02/10 0129) Office follow up Special Information or instructions. ? Need for O2 Signed: Richardson Landry Devonne Lalani  ACNP Acute Care Nurse Practitioner [place] Please consult Amion 05/16/2021, 9:01 AM

## 2021-05-16 NOTE — TOC Transition Note (Signed)
Transition of Care [facility]) - CM/SW Discharge Note   Patient Details  Name: Jimmy Marshall MRN: [id number] Date of Birth: 1956-01-14  [facility]) CM/SW Contact:  Zenon Mayo, RN Phone Number: 05/16/2021, 9:51 AM   Clinical Narrative:    Patient is for dc today, home oxygen is in the room , set up by previous NCM.  He has no other needs.         Patient Goals and CMS Choice        Discharge Placement                       Discharge Plan and Services                                     Social Determinants of Health (SDOH) Interventions     Readmission Risk Interventions No flowsheet data found.

## 2021-05-16 NOTE — Progress Notes (Addendum)
°  Progress Note    05/16/2021 6:53 AM Hospital Day 3  Subjective:  denies any chest, back or abdominal pain  Tm 100.1 now afebrile HR 70's-80's 62'X-528'U systolic 13% RA  Vitals:   05/16/21 0139 05/16/21 0418  BP: 115/72 (!) 110/59  Pulse: 78 81  Resp: 17 (!) 21  Temp: 100.1 F (37.8 C) 98.4 F (36.9 C)  SpO2: 97% 94%    Physical Exam: Cardiac:  regular  Lungs:  non labored Abdomen:  soft, NT Extremities:  easily palpable DP and radial pulses bilaterally  CBC    Component Value Date/Time   WBC 11.7 (H) 05/16/2021 0357   RBC 4.13 (L) 05/16/2021 0357   HGB 11.3 (L) 05/16/2021 0357   HCT 35.2 (L) 05/16/2021 0357   PLT 266 05/16/2021 0357   MCV 85.2 05/16/2021 0357   MCH 27.4 05/16/2021 0357   MCHC 32.1 05/16/2021 0357   RDW 17.4 (H) 05/16/2021 0357   LYMPHSABS 1.8 05/13/2021 0548   MONOABS 1.0 05/13/2021 0548   EOSABS 0.0 05/13/2021 0548   BASOSABS 0.0 05/13/2021 0548    BMET    Component Value Date/Time   NA 137 05/16/2021 0357   K 3.4 (L) 05/16/2021 0357   CL 103 05/16/2021 0357   CO2 24 05/16/2021 0357   GLUCOSE 138 (H) 05/16/2021 0357   BUN 22 05/16/2021 0357   CREATININE 1.28 (H) 05/16/2021 0357   CALCIUM 8.6 (L) 05/16/2021 0357   GFRNONAA >60 05/16/2021 0357   GFRAA 74 (L) 07/07/2012 0525    INR    Component Value Date/Time   INR 1.2 05/13/2021 0548     Intake/Output Summary (Last 24 hours) at 05/16/2021 0653 Last data filed at 05/16/2021 0419 Gross per 24 hour  Intake 720 ml  Output 0 ml  Net 720 ml     Assessment/Plan:  66 y.o. male with  type B aortic dissection  Hospital Day 3  -pt doing well and continues to be without back or chest pain.  -good control of BP -will f/u with Dr. Virl Cagey in the office in a month with CTA protocol.  Our office will arrange appt.  -will see again on Monday if he is still in the hospital.  Please call over the weekend if any vascular concerns arise.     Leontine Locket, PA-C [facility] [phone] 05/16/2021 6:53 AM   VASCULAR STAFF ADDENDUM: I have independently interviewed and examined the patient. I agree with the above.    Cassandria Santee, MD [facility] Phone Number: [phone] 05/16/2021 7:53 AM

## 2021-05-16 NOTE — Progress Notes (Signed)
Patient was transferred to 3East room 29. Report given to Rodman Key, RN. Belongings, portable home 02 and personal phone where transported to room with patient. NSR, A/O and without distress at time of transfer.

## 2021-05-18 LAB — CULTURE, BLOOD (ROUTINE X 2)
Culture: NO GROWTH
Culture: NO GROWTH
Special Requests: ADEQUATE

## 2021-05-27 ENCOUNTER — Ambulatory Visit (INDEPENDENT_AMBULATORY_CARE_PROVIDER_SITE_OTHER): Payer: Medicaid Other | Admitting: Pulmonary Disease

## 2021-05-27 ENCOUNTER — Other Ambulatory Visit: Payer: Self-pay

## 2021-05-27 ENCOUNTER — Telehealth: Payer: Self-pay | Admitting: Pulmonary Disease

## 2021-05-27 ENCOUNTER — Ambulatory Visit (INDEPENDENT_AMBULATORY_CARE_PROVIDER_SITE_OTHER): Payer: Medicaid Other

## 2021-05-27 ENCOUNTER — Encounter: Payer: Self-pay | Admitting: Pulmonary Disease

## 2021-05-27 VITALS — BP 100/62 | HR 83 | Ht 71.0 in | Wt 191.4 lb

## 2021-05-27 DIAGNOSIS — R918 Other nonspecific abnormal finding of lung field: Secondary | ICD-10-CM

## 2021-05-27 DIAGNOSIS — J9611 Chronic respiratory failure with hypoxia: Secondary | ICD-10-CM | POA: Diagnosis not present

## 2021-05-27 NOTE — Telephone Encounter (Signed)
Spoke with Colletta [place]  She states that she is needing order for POC  I advised that per AVS this would be done next visit if qualifies per AVS instructions  She verbalized understanding and nothing further needed

## 2021-05-27 NOTE — Patient Instructions (Signed)
Nice to meet you  Continue to check your oxygen at home.  Make sure the oxygen saturation is greater than 88%.  If so, at times you can turn down your oxygen and continue to monitor.  Please make sure the oxygen stays above 88% as returned on your oxygen.  If you need more than the 3 L you are using now for any reason please let me know.  We will do a 6-minute walk test before your next visit to see how much oxygen you still need.  Return to clinic in 2 months with Dr. Silas Flood, 6-minute walk test same day prior to visit

## 2021-05-27 NOTE — Progress Notes (Signed)
@Patient  ID: Jimmy Marshall, male    DOB: 01-Jan-1956, 66 y.o.   MRN: [id number]  Chief Complaint  Patient presents with   Hospitalization Follow-up    aneurysm    Referring provider: Benito Mccreedy, MD  HPI:   66 y.o. man whom we are seeing in hospital follow-up for hypoxemic respiratory failure.  Discharge summary reviewed.  Most recent progress notes prior to that via PCCM service reviewed.  Patient was admitted t 05/12/2021 with chest pain.  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  Nonoperative.  Treated with blood pressure and heart rate control.  Positive for cocaine.  He was placed on oxygen for his comfort.  At rest this was weaned off.  He was ambulated and required 2 L nasal cannula with ambulation.  Overall, he states he does not have any dyspnea.  Doing better.  No chest pain.  He is checking blood pressure twice a day and reports they are under control.  He writes them down but did not bring the record with him.  He still is using 2 to 3 L nasal cannula.  He checks his oxygen at home with a home pulse oximeter and notes it is staying above 88%, often in the 90s.  Reviewed chest imaging that on admission showed clear chest x-ray.  CTA dissection 05/12/2021 demonstrated clear lungs bilaterally without evidence of pulmonary embolus on my review interpretation.  CTA dissection protocol 05/14/2021 curiously demonstrated new right middle lobe atelectasis/collapse, and scattered left-sided linear atelectasis.  He did state that he was in a lot of pain during the admission.  Not taking deep breaths.  No cough.  PMH: Hypertension, hyperlipidemia Surgical history: Thyroglossal duct cyst, jaw fracture surgery Family history:History reviewed. No pertinent family history. Social history: Denies cigarette smoking, positive cocaine in the past, lives in [place] / Pulmonary Flowsheets:   ACT:  No flowsheet data found.  MMRC: No flowsheet data  found.  Epworth:  No flowsheet data found.  Tests:   FENO:  No results found for: NITRICOXIDE  PFT: No flowsheet data found.  WALK:  No flowsheet data found.  Imaging: Personally reviewed and as per EMR discussion this note DG Chest Portable 1 View  Result Date: 05/12/2021 CLINICAL DATA:  Chest pain. EXAM: PORTABLE CHEST 1 VIEW COMPARISON:  July 06, 2012. FINDINGS: Mild cardiomegaly. Both lungs are clear. The visualized skeletal structures are unremarkable. IMPRESSION: No active disease. Electronically Signed   By: Marijo Conception M.D.   On: 05/12/2021 15:01   CT Angio Chest/Abd/Pel for Dissection W and/or W/WO  Result Date: 05/14/2021 CLINICAL DATA:  Aortic dissection, considering treatment change EXAM: CT ANGIOGRAPHY CHEST, ABDOMEN AND PELVIS TECHNIQUE: Non-contrast CT of the chest was initially obtained. Multidetector CT imaging through the chest, abdomen and pelvis was performed using the standard protocol during bolus administration of intravenous contrast. Multiplanar reconstructed images and MIPs were obtained and reviewed to evaluate the vascular anatomy. RADIATION DOSE REDUCTION: This exam was performed according to the departmental dose-optimization program which includes automated exposure control, adjustment of the mA and/or kV according to patient size and/or use of iterative reconstruction technique. CONTRAST:  172mL OMNIPAQUE IOHEXOL 350 MG/ML SOLN COMPARISON:  None. FINDINGS: CTA CHEST FINDINGS Cardiovascular: The ascending aorta measures 4.1 cm in maximum diameter, unchanged. Extensive intramural hematoma extending from the aortic arch near the brachiocephalic origin throughout the descending thoracic aorta and into the abdomen to the level of the celiac axis and right renal artery.  Point of clarification: This was previously described as an aortic dissection, however the abnormality is visible noncontrast CT and therefore favored to represent an intramural hematoma rather than  dissection with a completely thrombosed false lumen. There is newly visible contrast pulling at the medial aspect of the distal aortic arch measuring 6 mm in width (series 6, image 57). There is a likely feeding intercostal artery. Additional tiny probable blood pole at aortic hiatus of the diaphragm with feeding intercostal artery (series 6, image 137). Normal cardiac size. Trace pericardial effusion. No evidence of pulmonary embolism. Mediastinum/Nodes: No lymphadenopathy. The thyroid is unremarkable. The esophagus is unremarkable. The trachea is unremarkable. Lungs/Pleura: Right middle lobe collapse. Lingular and left basilar subsegmental atelectasis. Right basilar scarring/linear atelectasis. No suspicious pulmonary nodules or masses. Musculoskeletal: No acute osseous abnormality. No suspicious lytic or blastic lesions. Multilevel degenerative changes of the spine. Review of the MIP images confirms the above findings. CTA ABDOMEN AND PELVIS FINDINGS VASCULAR Aorta: Extension of the intramural hematoma or dissection with thrombosed false lumen to the level of the celiac axis and right renal artery. The true lumen is widely patent. Celiac: Patent without dissection or stenosis. SMA: Patent without dissection or stenosis. Renals: Patent without stenosis. Decreased conspicuity of the linear defect seen on prior CTA. IMA: Moderate ostial stenosis. Inflow: There is ectasias of the left common iliac artery measuring up to 2.1 cm. No significant stenosis. No dissection. Veins: No obvious venous abnormality within the limitations of this arterial phase study. Review of the MIP images confirms the above findings. NON-VASCULAR Hepatobiliary: Unchanged multiple hepatic cysts. The gallbladder is unremarkable. Pancreas: Unremarkable. No pancreatic ductal dilatation or surrounding inflammatory changes. Spleen: There are multiple enhancing lesions in the spleen following blood pool consistent with hemangiomas. Adrenals/Urinary  Tract: Unchanged bilateral adrenal thickening and 5 mm low density left adrenal nodule statistically likely to be an adenoma. Stomach/Bowel: The stomach is within normal limits. There is no evidence of bowel obstruction. The appendix is normal. Colonic diverticulosis. No evidence of diverticulitis. Lymphatic: No lymphadenopathy. Reproductive: Unremarkable. Other: Small fat containing inguinal hernias bilaterally. No bowel containing hernia. No ascites. No free air. Musculoskeletal: No acute osseous abnormality. No suspicious lytic or blastic lesions. Multilevel degenerative changes of the spine, severe at L5-S1. Review of the MIP images confirms the above findings. IMPRESSION: Type B intramural hematoma with arch involvement extending from the brachiocephalic takeoff and distally into the abdomen to the level of the celiac and right renal artery, unchanged in extent from recent CTA. Newly visible contrast pooling in the medial aspect of the distal aortic arch measuring 6 mm, with visible feeding intercostal artery and additional tiny area at the aortic hiatus of the diaphragm medially. Given visible feeding vessels, these are favored to represent intramural blood pools and less likely ulcer like projections. Recommend short-term follow-up CT angiogram to ensure no progression to dissection. Unchanged mildly dilated ascending aorta measuring 4.1 cm, which can be reassessed on follow-up exam, at least annually. Right middle lobe collapse. These results were called by telephone at the time of interpretation on 05/14/2021 at 1:22 pm to provider Dr. Erskine Emery, who verbally acknowledged these results. Electronically Signed   By: Maurine Simmering M.D.   On: 05/14/2021 13:32   CT Angio Chest/Abd/Pel for Dissection W and/or Wo Contrast  Result Date: 05/12/2021 CLINICAL DATA:  Severe chest pain since 12:20 p.m. today. Clinical concern for acute aortic syndrome. Nonsmoker. EXAM: CT ANGIOGRAPHY CHEST, ABDOMEN AND PELVIS TECHNIQUE:  Multidetector CT imaging through the  chest, abdomen and pelvis was performed using the standard protocol during bolus administration of intravenous contrast following noncontrast images through the chest. Multiplanar reconstructed images and MIPs were obtained and reviewed to evaluate the vascular anatomy. RADIATION DOSE REDUCTION: This exam was performed according to the departmental dose-optimization program which includes automated exposure control, adjustment of the mA and/or kV according to patient size and/or use of iterative reconstruction technique. CONTRAST:  159mL OMNIPAQUE IOHEXOL 350 MG/ML SOLN COMPARISON:  Portable chest obtained earlier today. Neck CT obtained with contrast on 05/25/2011. FINDINGS: CTA CHEST FINDINGS Cardiovascular: Type 3 aortic dissection beginning at the level of the left subclavian artery and extending into the upper abdomen as far as the origin of the celiac axis. There is a thin, diffusely thrombosed false lumen with no narrowing of the true lumen. Normal appearing ascending thoracic aorta measuring 4.1 cm in maximum diameter. Mildly enlarged central pulmonary arteries with a proximal main pulmonary artery transverse diameter of 3.1 cm. Normally opacified pulmonary arteries with no pulmonary arterial filling defects seen. Small amount pericardial fluid superiorly. Mediastinum/Nodes: No enlarged mediastinal, hilar, or axillary lymph nodes. Thyroid gland, trachea, and esophagus demonstrate no significant findings. Lungs/Pleura: Minimal linear atelectasis/scarring at the lung bases. No pleural fluid. No pneumothorax. Musculoskeletal: Thoracic spine degenerative changes. Review of the MIP images confirms the above findings. CTA ABDOMEN AND PELVIS FINDINGS VASCULAR Aorta: The thoracic aortic dissection extends into the upper abdomen is far as the origin of the origins of the renal arteries with continued thrombosis of the small, false lumen without true luminal narrowing. Celiac:  Patent without evidence of aneurysm, dissection, vasculitis or significant stenosis. SMA: Patent without evidence of aneurysm, dissection, vasculitis or significant stenosis. Renals: Thin, linear defect in the larger, upper right renal artery extending from close to the origin into the hilum. There is a more segmentally visualized similar appearance in the left renal artery. There is a smaller more inferior right renal artery that has a normal appearance. IMA: Calcified plaque at the origin causing approximately 70% luminal stenosis. Inflow: Tortuous distal abdominal aorta, common iliac arteries and external iliac arteries with minimal calcified plaque formation without significant luminal stenosis. Veins: No obvious venous abnormality within the limitations of this arterial phase study. Review of the MIP images confirms the above findings. NON-VASCULAR Hepatobiliary: Multiple liver cysts with streak artifacts compromising density measurements. Normal appearing gallbladder. Pancreas: Unremarkable. No pancreatic ductal dilatation or surrounding inflammatory changes. Spleen: Normal in size and shape. Multiple small, homogeneously high density lesions with an appearance compatible with flash enhancement. Adrenals/Urinary Tract: 5 mm low density left adrenal mass compatible with a very small adenoma. Tiny right renal cyst. Normal enhancement of the kidneys. Unremarkable ureters and urinary bladder. Stomach/Bowel: Stomach is within normal limits. Appendix appears normal. No evidence of bowel wall thickening, distention, or inflammatory changes. Lymphatic: No enlarged lymph nodes. Reproductive: Prostate is unremarkable. Other: Small bilateral inguinal hernias containing fat. Tiny umbilical hernia containing fat. Musculoskeletal: Lumbar spine degenerative changes, most pronounced at the L4-5 and L5-S1 levels. Review of the MIP images confirms the above findings. IMPRESSION: 1. Type 3 aortic dissection extending from the  left subclavian artery to the renal veins with thrombosis of a small false lumen with stenosis of the true lumen. 2. The dissection is involving the upper right renal artery and the left renal artery with patency of both the true and false lumens. 3. Small amount of pericardial fluid superiorly extending to the beginning of the dissection. 4. Minimal enlargement of the ascending  thoracic aorta with a maximum diameter of 4.1 cm. Recommend annual imaging followup by CTA or MRA. This recommendation follows 2010 ACCF/AHA/AATS/ACR/ASA/SCA/SCAI/SIR/STS/SVM Guidelines for the Diagnosis and Management of Patients with Thoracic Aortic Disease. Circulation. 2010; 121: G315-V761. Aortic aneurysm NOS (ICD10-I71.9) 5. Smaller, more inferior right renal artery without dissection. 6. Mild enlargement of the central pulmonary arteries. This can be seen with pulmonary arterial hypertension. 7. Multiple liver cysts. 8. Critical Value/emergent results were called by telephone at the time of interpretation on 05/12/2021 at 5:49 pm to provider Trifan, who verbally acknowledged these results. Electronically Signed   By: Claudie Revering M.D.   On: 05/12/2021 18:11    Lab Results: Personally reviewed CBC    Component Value Date/Time   WBC 11.7 (H) 05/16/2021 0357   RBC 4.13 (L) 05/16/2021 0357   HGB 11.3 (L) 05/16/2021 0357   HCT 35.2 (L) 05/16/2021 0357   PLT 266 05/16/2021 0357   MCV 85.2 05/16/2021 0357   MCH 27.4 05/16/2021 0357   MCHC 32.1 05/16/2021 0357   RDW 17.4 (H) 05/16/2021 0357   LYMPHSABS 1.8 05/13/2021 0548   MONOABS 1.0 05/13/2021 0548   EOSABS 0.0 05/13/2021 0548   BASOSABS 0.0 05/13/2021 0548    BMET    Component Value Date/Time   NA 137 05/16/2021 0357   K 3.4 (L) 05/16/2021 0357   CL 103 05/16/2021 0357   CO2 24 05/16/2021 0357   GLUCOSE 138 (H) 05/16/2021 0357   BUN 22 05/16/2021 0357   CREATININE 1.28 (H) 05/16/2021 0357   CALCIUM 8.6 (L) 05/16/2021 0357   GFRNONAA >60 05/16/2021 0357    GFRAA 74 (L) 07/07/2012 0525    BNP No results found for: BNP  ProBNP No results found for: PROBNP  Specialty Problems   None   No Known Allergies  Immunization History  Administered Date(s) Administered   Influenza-Unspecified 12/06/2010, 01/01/2012   Pneumococcal-Unspecified 12/06/2010    Past Medical History:  Diagnosis Date   GERD (gastroesophageal reflux disease)    Glaucoma    Hypertension    Schizoaffective disorder, depressive type ([place])    "dx'd in the 1990's"   Schizophrenia, schizo-affective ([place])     Tobacco History: Social History   Tobacco Use  Smoking Status Never  Smokeless Tobacco Never   Counseling given: Not Answered   Continue to not smoke  Outpatient Encounter Medications as of 05/27/2021  Medication Sig   amLODipine (NORVASC) 10 MG tablet Take 10 mg by mouth daily.   carvedilol (COREG) 12.5 MG tablet Take 1 tablet (12.5 mg total) by mouth 2 (two) times daily with a meal.   losartan (COZAAR) 50 MG tablet Take 50 mg by mouth daily.   metFORMIN (GLUCOPHAGE) 500 MG tablet Take 500 mg by mouth daily with breakfast.   mirtazapine (REMERON) 15 MG tablet Take 15 mg by mouth at bedtime.   OLANZapine (ZYPREXA) 15 MG tablet Take 1 tablet (15 mg total) by mouth at bedtime.   ASPIRIN LOW DOSE 81 MG EC tablet Take 81 mg by mouth daily. (Patient not taking: Reported on 06/17/2020)   atorvastatin (LIPITOR) 40 MG tablet Take 40 mg by mouth daily. (Patient not taking: Reported on 06/17/2020)   hydrALAZINE (APRESOLINE) 25 MG tablet Take 1 tablet (25 mg total) by mouth every 8 (eight) hours. (Patient not taking: Reported on 05/27/2021)   hydrOXYzine (ATARAX/VISTARIL) 50 MG tablet Take 1 tablet (50 mg total) by mouth at bedtime as needed for anxiety (sleep). (Patient not taking: Reported on 05/12/2021)  insulin glargine (LANTUS) 100 UNIT/ML injection 10 units (Patient not taking: Reported on 05/12/2021)   Multiple Vitamin (MULTIVITAMIN WITH MINERALS) TABS tablet Take  1 tablet by mouth daily. (Patient not taking: Reported on 05/27/2021)   omeprazole (PRILOSEC) 20 MG capsule Take 20 mg by mouth daily. (Patient not taking: Reported on 05/27/2021)   tadalafil (CIALIS) 20 MG tablet Take 20 mg by mouth daily as needed for erectile dysfunction. (Patient not taking: Reported on 05/27/2021)   timolol (BETIMOL) 0.5 % ophthalmic solution Place 1 drop into both eyes 2 (two) times daily. (Patient not taking: Reported on 05/12/2021)   traMADol (ULTRAM) 50 MG tablet Take 1 tablet (50 mg total) by mouth every 6 (six) hours as needed. (Patient not taking: Reported on 05/12/2021)   vitamin C (ASCORBIC ACID) 250 MG tablet Take 250 mg by mouth daily. (Patient not taking: Reported on 05/12/2021)   No facility-administered encounter medications on file as of 05/27/2021.     Review of Systems  Review of Systems  No chest pain with exertion.  No orthopnea or PND.  Comprehensive review of systems otherwise negative. Physical Exam  BP 100/62 (BP Location: Left Arm, Cuff Size: Normal)    Pulse 83    Ht 5\' 11"  (1.803 m)    Wt 191 lb 6.4 oz (86.8 kg)    SpO2 98%    BMI 26.69 kg/m   Wt Readings from Last 5 Encounters:  05/27/21 191 lb 6.4 oz (86.8 kg)  05/16/21 191 lb 6.4 oz (86.8 kg)  07/16/20 200 lb (90.7 kg)  06/17/20 202 lb (91.6 kg)  07/06/12 190 lb 4.1 oz (86.3 kg)    BMI Readings from Last 5 Encounters:  05/27/21 26.69 kg/m  05/16/21 26.69 kg/m  07/16/20 27.89 kg/m  06/17/20 28.17 kg/m  07/06/12 26.54 kg/m     Physical Exam General: Well-appearing, no acute distress Eyes: EOMI, no icterus Neck: Supple, no JVP Pulmonary: Clear, normal work of breathing Cardiovascular: Regular in rhythm, no murmur Abdomen: Nondistended, bowel sounds present MSK: No synovitis, joint effusion Neuro: Normal gait, no weakness Psych: Normal mood, full affect   Assessment & Plan:   Chronic hypoxemic respiratory failure: Following recent hospitalization for aortic dissection.   Parenchyma clear on CT scan 05/12/2021.  No evidence of PE.  Repeat CT a 05/14/2021 shows new right middle lobe collapse/atelectasis.  Suspect in the setting of chest pain, splinting, possible mucus impaction although no obvious source.  No mass preceding this scan 2 days earlier.  Suspect this is transitory.  Counseled to check his oxygen at home at rest and with exertion.  Keep it above 88%.  Okay to turn down oxygen intermittently as long as oxygen stays above 88%.  We will see if he can wean off in the coming weeks.  6-minute walk test at next visit   Right middle lobe collapse: Suspect this transitory was not present on initial scan.  Repeat chest x-ray today.   Return in about 2 months (around 07/25/2021).   Lanier Clam, MD 05/27/2021   This appointment required 50 minutes of patient care (this includes precharting, chart review, review of results, face-to-face care, etc.).

## 2021-06-13 ENCOUNTER — Other Ambulatory Visit: Payer: Self-pay

## 2021-06-13 ENCOUNTER — Encounter: Payer: Medicaid Other | Admitting: Surgery

## 2021-06-13 DIAGNOSIS — I7103 Dissection of thoracoabdominal aorta: Secondary | ICD-10-CM

## 2021-06-23 ENCOUNTER — Ambulatory Visit (HOSPITAL_COMMUNITY)
Admission: RE | Admit: 2021-06-23 | Discharge: 2021-06-23 | Disposition: A | Discharge status: Home / Self Care | Payer: Medicaid Other | Source: Ambulatory Visit | Attending: Vascular Surgery | Admitting: Vascular Surgery

## 2021-06-23 ENCOUNTER — Other Ambulatory Visit: Payer: Self-pay

## 2021-06-23 DIAGNOSIS — I7103 Dissection of thoracoabdominal aorta: Secondary | ICD-10-CM | POA: Insufficient documentation

## 2021-06-23 LAB — POCT I-STAT CREATININE: Creatinine, Ser: 1 mg/dL (ref 0.61–1.24)

## 2021-06-23 MED ORDER — IOHEXOL 350 MG/ML SOLN
100.0000 mL | Freq: Once | INTRAVENOUS | Status: AC | PRN
  Administered 2021-06-23: 100 mL via INTRAVENOUS

## 2021-06-26 NOTE — Progress Notes (Deleted)
?Office Note  ? ? ?HPI: Jimmy Marshall is a 66 y.o. ([birth year]) male presenting in follow-up after being admitted to [facility] with acute aortic intramural hematoma.  This was managed nonoperatively with blood pressure control.  At the time of discharge, Dron was asymptomatic, feeling well. ? ?He presents today with follow-up imaging.  On exam, ? ?The pt is *** on a statin for cholesterol management.  ?The pt is *** on a daily aspirin.   Other AC:  *** ?The pt is *** on medication for hypertension.   ?The pt is *** diabetic.  ?Tobacco hx:  *** ? ?Past Medical History:  ?Diagnosis Date  ? GERD (gastroesophageal reflux disease)   ? Glaucoma   ? Hypertension   ? Schizoaffective disorder, depressive type ([place])   ? "dx'd in the 1990's"  ? Schizophrenia, schizo-affective ([place])   ? ? ?Past Surgical History:  ?Procedure Laterality Date  ? FRACTURE SURGERY  1996  ? jaw   ? INCISION AND DRAINAGE / EXCISION THYROGLOSSAL CYST  06/30/11  ? w/central hyoid resection  ? THYROGLOSSAL DUCT CYST  06/30/2011  ? Procedure: THYROGLOSSAL DUCT CYST;  Surgeon: Jerrell Belfast, MD;  Location: [place];  Service: ENT;  Laterality: N/A;  excision of thyroglossal duct cyst  ? ? ?Social History  ? ?Socioeconomic History  ? Marital status: Divorced  ?  Spouse name: Not on file  ? Number of children: Not on file  ? Years of education: Not on file  ? Highest education level: Not on file  ?Occupational History  ? Not on file  ?Tobacco Use  ? Smoking status: Never  ? Smokeless tobacco: Never  ?Substance and Sexual Activity  ? Alcohol use: Yes  ?  Comment: "stopped drinking 1999"  ? Drug use: Yes  ?  Types: Marijuana, Cocaine  ?  Comment: "last marijuana 1990's; last  recreational cocaine 1999"  ? Sexual activity: Never  ?Other Topics Concern  ? Not on file  ?Social History Narrative  ? Not on file  ? ?Social Determinants of Health  ? ?Financial Resource Strain: Not on file  ?Food Insecurity: Not on file  ?Transportation Needs: Not on file   ?Physical Activity: Not on file  ?Stress: Not on file  ?Social Connections: Not on file  ?Intimate Partner Violence: Not on file  ? ?***No family history on file. ? ?Current Outpatient Medications  ?Medication Sig Dispense Refill  ? amLODipine (NORVASC) 10 MG tablet Take 10 mg by mouth daily.    ? ASPIRIN LOW DOSE 81 MG EC tablet Take 81 mg by mouth daily. (Patient not taking: Reported on 06/17/2020)    ? atorvastatin (LIPITOR) 40 MG tablet Take 40 mg by mouth daily. (Patient not taking: Reported on 06/17/2020)    ? carvedilol (COREG) 12.5 MG tablet Take 1 tablet (12.5 mg total) by mouth 2 (two) times daily with a meal. 60 tablet 3  ? hydrALAZINE (APRESOLINE) 25 MG tablet Take 1 tablet (25 mg total) by mouth every 8 (eight) hours. (Patient not taking: Reported on 05/27/2021) 90 tablet 3  ? hydrOXYzine (ATARAX/VISTARIL) 50 MG tablet Take 1 tablet (50 mg total) by mouth at bedtime as needed for anxiety (sleep). (Patient not taking: Reported on 05/12/2021) 30 tablet 0  ? insulin glargine (LANTUS) 100 UNIT/ML injection 10 units (Patient not taking: Reported on 05/12/2021)    ? losartan (COZAAR) 50 MG tablet Take 50 mg by mouth daily.    ? metFORMIN (GLUCOPHAGE) 500 MG tablet Take 500 mg  by mouth daily with breakfast.    ? mirtazapine (REMERON) 15 MG tablet Take 15 mg by mouth at bedtime.    ? Multiple Vitamin (MULTIVITAMIN WITH MINERALS) TABS tablet Take 1 tablet by mouth daily. (Patient not taking: Reported on 05/27/2021)    ? OLANZapine (ZYPREXA) 15 MG tablet Take 1 tablet (15 mg total) by mouth at bedtime. 30 tablet 0  ? omeprazole (PRILOSEC) 20 MG capsule Take 20 mg by mouth daily. (Patient not taking: Reported on 05/27/2021)    ? tadalafil (CIALIS) 20 MG tablet Take 20 mg by mouth daily as needed for erectile dysfunction. (Patient not taking: Reported on 05/27/2021)    ? timolol (BETIMOL) 0.5 % ophthalmic solution Place 1 drop into both eyes 2 (two) times daily. (Patient not taking: Reported on 05/12/2021) 10 mL 0  ? traMADol  (ULTRAM) 50 MG tablet Take 1 tablet (50 mg total) by mouth every 6 (six) hours as needed. (Patient not taking: Reported on 05/12/2021) 10 tablet 0  ? vitamin C (ASCORBIC ACID) 250 MG tablet Take 250 mg by mouth daily. (Patient not taking: Reported on 05/12/2021)    ? ?No current facility-administered medications for this visit.  ? ? ?No Known Allergies ? ? ?REVIEW OF SYSTEMS:  ?*** ?'[X]'$  denotes positive finding, '[ ]'$  denotes negative finding ?Cardiac  Comments:  ?Chest pain or chest pressure:    ?Shortness of breath upon exertion:    ?Short of breath when lying flat:    ?Irregular heart rhythm:    ?    ?Vascular    ?Pain in calf, thigh, or hip brought on by ambulation:    ?Pain in feet at night that wakes you up from your sleep:     ?Blood clot in your veins:    ?Leg swelling:     ?    ?Pulmonary    ?Oxygen at home:    ?Productive cough:     ?Wheezing:     ?    ?Neurologic    ?Sudden weakness in arms or legs:     ?Sudden numbness in arms or legs:     ?Sudden onset of difficulty speaking or slurred speech:    ?Temporary loss of vision in one eye:     ?Problems with dizziness:     ?    ?Gastrointestinal    ?Blood in stool:     ?Vomited blood:     ?    ?Genitourinary    ?Burning when urinating:     ?Blood in urine:    ?    ?Psychiatric    ?Major depression:     ?    ?Hematologic    ?Bleeding problems:    ?Problems with blood clotting too easily:    ?    ?Skin    ?Rashes or ulcers:    ?    ?Constitutional    ?Fever or chills:    ? ? ?PHYSICAL EXAMINATION: ? ?There were no vitals filed for this visit. ? ?General:  WDWN in NAD; vital signs documented above ?Gait: Not observed ?HENT: WNL, normocephalic ?Pulmonary: normal non-labored breathing , without wheezing ?Cardiac: {Desc; regular/irreg:14544} HR, bruit*** ?Abdomen: soft, NT, no masses ?Skin: {With/Without:20273} rashes ?Vascular Exam/Pulses: ? Right Left  ?Radial {Exam; arterial pulse strength 0-4:30167} {Exam; arterial pulse strength 0-4:30167}  ?Ulnar {Exam; arterial  pulse strength 0-4:30167} {Exam; arterial pulse strength 0-4:30167}  ?Femoral {Exam; arterial pulse strength 0-4:30167} {Exam; arterial pulse strength 0-4:30167}  ?Popliteal {Exam; arterial pulse strength 0-4:30167} {Exam; arterial pulse  strength 0-4:30167}  ?DP {Exam; arterial pulse strength 0-4:30167} {Exam; arterial pulse strength 0-4:30167}  ?PT {Exam; arterial pulse strength 0-4:30167} {Exam; arterial pulse strength 0-4:30167}  ? ?Extremities: {With/Without:20273} ischemic changes, {With/Without:20273} Gangrene , {With/Without:20273} cellulitis; {With/Without:20273} open wounds;  ?Musculoskeletal: no muscle wasting or atrophy  ?Neurologic: A&O X 3;  No focal weakness or paresthesias are detected ?Psychiatric:  The pt has {Desc; normal/abnormal:11317::"Normal"} affect. ? ? ?Non-Invasive Vascular Imaging:   ?*** ? ? ? ?ASSESSMENT/PLAN: Martez Weiand is a 66 y.o. 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  Imaging was reviewed demonstrating near-resolution of the intramural hematoma.  There is a small lesion at the proximal aspect of the celiac artery which we will continue to watch.  Physical exam, he was palpable in the feet with a soft abdomen. ? ?I will see Marin again in 6 months with repeat scan to ensure there are no further issues. ? ? ? ? ?*** ? ? ?Broadus John, MD ?[facility] ?[phone]  ?

## 2021-06-27 ENCOUNTER — Ambulatory Visit: Payer: Medicaid Other | Admitting: Vascular Surgery

## 2021-07-07 ENCOUNTER — Telehealth: Payer: Self-pay

## 2021-07-07 ENCOUNTER — Other Ambulatory Visit: Payer: Self-pay

## 2021-07-07 ENCOUNTER — Ambulatory Visit (INDEPENDENT_AMBULATORY_CARE_PROVIDER_SITE_OTHER): Payer: Medicaid Other | Admitting: Vascular Surgery

## 2021-07-07 ENCOUNTER — Encounter: Payer: Self-pay | Admitting: Vascular Surgery

## 2021-07-07 VITALS — BP 118/81 | HR 94 | Temp 98.1°F | Resp 16 | Ht 71.0 in | Wt 204.0 lb

## 2021-07-07 DIAGNOSIS — I7103 Dissection of thoracoabdominal aorta: Secondary | ICD-10-CM

## 2021-07-07 DIAGNOSIS — I7100 Dissection of unspecified site of aorta: Secondary | ICD-10-CM | POA: Diagnosis not present

## 2021-07-07 NOTE — Progress Notes (Signed)
?Office Note  ? ? ?HPI: Jimmy Marshall is a 66 y.o. (05-16-1955) male presenting in follow-up after being evaluated in the emergency department with a zone 2 through 5 aortic intramural hematoma. Tox screen at the time was positive, however, Jimmy Marshall noted he had not used cocaine in several days. He was treated in the hospital with anti and pulse control, which resolved his symptoms.  He was converted to oral therapy for outpatient follow-up. ? ?Jimmy Marshall presents today, feeling well.  He has not required supplemental oxygen, which she was discharged from the hospital with.  He denies abdominal pain back pain, chest pain. ? ?The pt is  on a statin for cholesterol management.  ?The pt is  on a daily aspirin.   Other AC:  - ?The pt is  on medication for hypertension.   ?The pt is  diabetic.  ?Tobacco hx:  - ? ?Past Medical History:  ?Diagnosis Date  ? GERD (gastroesophageal reflux disease)   ? Glaucoma   ? Hypertension   ? Schizoaffective disorder, depressive type ([place])   ? "dx'd in the 1990's"  ? Schizophrenia, schizo-affective ([place])   ? ? ?Past Surgical History:  ?Procedure Laterality Date  ? FRACTURE SURGERY  1996  ? jaw   ? INCISION AND DRAINAGE / EXCISION THYROGLOSSAL CYST  06/30/11  ? w/central hyoid resection  ? THYROGLOSSAL DUCT CYST  06/30/2011  ? Procedure: THYROGLOSSAL DUCT CYST;  Surgeon: Jerrell Belfast, MD;  Location: [place];  Service: ENT;  Laterality: N/A;  excision of thyroglossal duct cyst  ? ? ?Social History  ? ?Socioeconomic History  ? Marital status: Divorced  ?  Spouse name: Not on file  ? Number of children: Not on file  ? Years of education: Not on file  ? Highest education level: Not on file  ?Occupational History  ? Not on file  ?Tobacco Use  ? Smoking status: Never  ? Smokeless tobacco: Never  ?Substance and Sexual Activity  ? Alcohol use: Yes  ?  Comment: "stopped drinking 1999"  ? Drug use: Yes  ?  Types: Marijuana, Cocaine  ?  Comment: "last marijuana 1990's; last  recreational cocaine 1999"   ? Sexual activity: Never  ?Other Topics Concern  ? Not on file  ?Social History Narrative  ? Not on file  ? ?Social Determinants of Health  ? ?Financial Resource Strain: Not on file  ?Food Insecurity: Not on file  ?Transportation Needs: Not on file  ?Physical Activity: Not on file  ?Stress: Not on file  ?Social Connections: Not on file  ?Intimate Partner Violence: Not on file  ? ?No family history on file. ? ?Current Outpatient Medications  ?Medication Sig Dispense Refill  ? amLODipine (NORVASC) 10 MG tablet Take 10 mg by mouth daily.    ? hydrALAZINE (APRESOLINE) 25 MG tablet Take 1 tablet (25 mg total) by mouth every 8 (eight) hours. 90 tablet 3  ? hydrOXYzine (ATARAX/VISTARIL) 50 MG tablet Take 1 tablet (50 mg total) by mouth at bedtime as needed for anxiety (sleep). 30 tablet 0  ? insulin glargine (LANTUS) 100 UNIT/ML injection     ? losartan (COZAAR) 50 MG tablet Take 50 mg by mouth daily.    ? metFORMIN (GLUCOPHAGE) 500 MG tablet Take 500 mg by mouth daily with breakfast.    ? mirtazapine (REMERON) 15 MG tablet Take 15 mg by mouth at bedtime.    ? Multiple Vitamin (MULTIVITAMIN WITH MINERALS) TABS tablet Take 1 tablet by mouth daily.    ?  OLANZapine (ZYPREXA) 15 MG tablet Take 1 tablet (15 mg total) by mouth at bedtime. 30 tablet 0  ? traMADol (ULTRAM) 50 MG tablet Take 1 tablet (50 mg total) by mouth every 6 (six) hours as needed. 10 tablet 0  ? vitamin C (ASCORBIC ACID) 250 MG tablet Take 250 mg by mouth daily.    ? ASPIRIN LOW DOSE 81 MG EC tablet Take 81 mg by mouth daily. (Patient not taking: Reported on 06/17/2020)    ? atorvastatin (LIPITOR) 40 MG tablet Take 40 mg by mouth daily. (Patient not taking: Reported on 07/07/2021)    ? carvedilol (COREG) 12.5 MG tablet Take 1 tablet (12.5 mg total) by mouth 2 (two) times daily with a meal. (Patient not taking: Reported on 07/07/2021) 60 tablet 3  ? omeprazole (PRILOSEC) 20 MG capsule Take 20 mg by mouth daily. (Patient not taking: Reported on 05/27/2021)    ?  tadalafil (CIALIS) 20 MG tablet Take 20 mg by mouth daily as needed for erectile dysfunction. (Patient not taking: Reported on 05/27/2021)    ? timolol (BETIMOL) 0.5 % ophthalmic solution Place 1 drop into both eyes 2 (two) times daily. (Patient not taking: Reported on 05/12/2021) 10 mL 0  ? ?No current facility-administered medications for this visit.  ? ? ?No Known Allergies ? ? ?REVIEW OF SYSTEMS:  ?'[X]'$  denotes positive finding, '[ ]'$  denotes negative finding ?Cardiac  Comments:  ?Chest pain or chest pressure:    ?Shortness of breath upon exertion:    ?Short of breath when lying flat:    ?Irregular heart rhythm:    ?    ?Vascular    ?Pain in calf, thigh, or hip brought on by ambulation:    ?Pain in feet at night that wakes you up from your sleep:     ?Blood clot in your veins:    ?Leg swelling:     ?    ?Pulmonary    ?Oxygen at home:    ?Productive cough:     ?Wheezing:     ?    ?Neurologic    ?Sudden weakness in arms or legs:     ?Sudden numbness in arms or legs:     ?Sudden onset of difficulty speaking or slurred speech:    ?Temporary loss of vision in one eye:     ?Problems with dizziness:     ?    ?Gastrointestinal    ?Blood in stool:     ?Vomited blood:     ?    ?Genitourinary    ?Burning when urinating:     ?Blood in urine:    ?    ?Psychiatric    ?Major depression:     ?    ?Hematologic    ?Bleeding problems:    ?Problems with blood clotting too easily:    ?    ?Skin    ?Rashes or ulcers:    ?    ?Constitutional    ?Fever or chills:    ? ? ?PHYSICAL EXAMINATION: ? ?Vitals:  ? 07/07/21 1002  ?BP: 118/81  ?Pulse: 94  ?Resp: 16  ?Temp: 98.1 ?F (36.7 ?C)  ?TempSrc: Temporal  ?SpO2: 99%  ?Weight: 204 lb (92.5 kg)  ?Height: '5\' 11"'$  (1.803 m)  ? ? ?General:  WDWN in NAD; vital signs documented above ?Gait: Not observed ?HENT: WNL, normocephalic ?Pulmonary: normal non-labored breathing , without wheezing ?Cardiac: regular HR ?Abdomen: soft, NT, no masses ?Skin: without rashes ?Vascular Exam/Pulses: ? Right Left  ?Radial  2+ (normal) 2+ (normal)  ?Ulnar 2+ (normal) 2+ (normal)  ?Femoral    ?Popliteal    ?DP 2+ (normal) 2+ (normal)  ?PT 2+ (normal) 2+ (normal)  ? ?Extremities: without ischemic changes, without Gangrene , without cellulitis; without open wounds;  ?Musculoskeletal: no muscle wasting or atrophy  ?Neurologic: A&O X 3;  No focal weakness or paresthesias are detected ?Psychiatric:  The pt has Normal affect. ? ? ?Non-Invasive Vascular Imaging:   ? ?See CT ? ? ? ?ASSESSMENT/PLAN: Jimmy Marshall is a 66 y.o. male presenting in follow-up status post acute zone 2-5 aortic intramural hematoma.  Since discharge, Jimmy Marshall has been doing well, with no issues.  He is asymptomatic. ?Imaging was reviewed demonstrating resolution of the aortic hematoma in zone 5.  The chest was not evaluated in the CT scan.  He will need follow-up imaging to assess zones to through 4.  Also in the abdominal CT, there is a small luminal irregularity at the takeoff of the celiac artery.  This is small, and will be reimaged on the CT angio chest that is ordered. ? ?Pending results of the above study, will likely follow Jimmy Marshall up in 6 months with repeat CT scan to assess for aneurysmal degeneration.  His ascending aorta is 4.1 cm in size, and unchanged. ? ? ?Broadus John, MD ?[facility] ?[phone] ? ?

## 2021-07-07 NOTE — Telephone Encounter (Signed)
-----   Message from Cathlean Cower sent at 07/07/2021  3:13 PM EDT ----- ?Regarding: RE: CTAs ?07/07/21 - left msg on vm ?----- Message ----- ?From: Kaleen Mask, LPN ?Sent: 07/07/2021   2:38 PM EDT ?To: April H Pait, Cathlean Cower, # ?Subject: CTAs                                          ? ?Please call pt to schedule CTA chest, Abdomen, Pelvis with dissection protocol in 6 mth.  Thanks. ? ? ?

## 2021-07-15 ENCOUNTER — Inpatient Hospital Stay: Admission: RE | Admit: 2021-07-15 | Payer: Medicaid Other | Source: Ambulatory Visit

## 2021-07-18 ENCOUNTER — Ambulatory Visit: Payer: Medicaid Other | Admitting: Vascular Surgery

## 2021-07-24 ENCOUNTER — Encounter: Payer: Self-pay | Admitting: Pulmonary Disease

## 2021-07-24 ENCOUNTER — Ambulatory Visit (INDEPENDENT_AMBULATORY_CARE_PROVIDER_SITE_OTHER): Payer: Medicaid Other | Admitting: Pulmonary Disease

## 2021-07-24 VITALS — BP 120/72 | HR 71 | Temp 98.2°F | Ht 71.0 in | Wt 206.0 lb

## 2021-07-24 DIAGNOSIS — J9611 Chronic respiratory failure with hypoxia: Secondary | ICD-10-CM

## 2021-07-24 NOTE — Progress Notes (Signed)
? ?'@Patient'$  ID: Jimmy Marshall, male    DOB: 1955/10/09, 66 y.o.   MRN: [id number] ? ?Chief Complaint  ?Patient presents with  ? Follow-up  ?  Follow up from feb. Pt states when the weather changes his breathing is wrose. He is on 2L via the POC.   ? ? ?Referring provider: ?Benito Mccreedy, MD ? ?HPI:  ? ?66 y.o. man whom we are seeing in follow-up for hypoxemic respiratory failure.  Most recent vascular surgery note reviewed. ? ?Overall doing well.  No chest pain.  Reviewed most recent CT scan.  On my interpretation, right middle lobe total collapse improved, about 80%.  Still some residual atelectasis anteriorly.  He feels well.  Denies significant dyspnea. ? ?HPI at initial visit: ?Patient was admitted  05/12/2021 with chest pain.  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  Nonoperative.  Treated with blood pressure and heart rate control.  Positive for cocaine.  He was placed on oxygen for his comfort.  At rest this was weaned off.  He was ambulated and required 2 L nasal cannula with ambulation.  Overall, he states he does not have any dyspnea.  Doing better.  No chest pain.  He is checking blood pressure twice a day and reports they are under control.  He writes them down but did not bring the record with him.  He still is using 2 to 3 L nasal cannula.  He checks his oxygen at home with a home pulse oximeter and notes it is staying above 88%, often in the 90s. ? ?Reviewed chest imaging that on admission showed clear chest x-ray.  CTA dissection 05/12/2021 demonstrated clear lungs bilaterally without evidence of pulmonary embolus on my review interpretation.  CTA dissection protocol 05/14/2021 curiously demonstrated new right middle lobe atelectasis/collapse, and scattered left-sided linear atelectasis. ? ?He did state that he was in a lot of pain during the admission.  Not taking deep breaths.  No cough. ? ?PMH: Hypertension, hyperlipidemia ?Surgical history: Thyroglossal duct cyst, jaw fracture  surgery ?Family history:History reviewed. No pertinent family history. ?Social history: Denies cigarette smoking, positive cocaine in the past, lives in [place] ? ? ?Questionaires / Pulmonary Flowsheets:  ? ?ACT:  ?   ? View : No data to display.  ?  ?  ?  ? ? ?MMRC: ?   ? View : No data to display.  ?  ?  ?  ? ? ?Epworth:  ?   ? View : No data to display.  ?  ?  ?  ? ? ?Tests:  ? ?FENO:  ?No results found for: NITRICOXIDE ? ?PFT: ?   ? View : No data to display.  ?  ?  ?  ? ? ?WALK:  ?   ? View : No data to display.  ?  ?  ?  ? ? ?Imaging: ?Personally reviewed and as per EMR discussion this note ?No results found. ? ?Lab Results: ?Personally reviewed ?CBC ?   ?Component Value Date/Time  ? WBC 11.7 (H) 05/16/2021 0357  ? RBC 4.13 (L) 05/16/2021 0357  ? HGB 11.3 (L) 05/16/2021 0357  ? HCT 35.2 (L) 05/16/2021 0357  ? PLT 266 05/16/2021 0357  ? MCV 85.2 05/16/2021 0357  ? MCH 27.4 05/16/2021 0357  ? MCHC 32.1 05/16/2021 0357  ? RDW 17.4 (H) 05/16/2021 0357  ? LYMPHSABS 1.8 05/13/2021 0548  ? MONOABS 1.0 05/13/2021 0548  ? EOSABS 0.0 05/13/2021 0548  ? BASOSABS 0.0 05/13/2021  0548  ? ? ?BMET ?   ?Component Value Date/Time  ? NA 137 05/16/2021 0357  ? K 3.4 (L) 05/16/2021 0357  ? CL 103 05/16/2021 0357  ? CO2 24 05/16/2021 0357  ? GLUCOSE 138 (H) 05/16/2021 0357  ? BUN 22 05/16/2021 0357  ? CREATININE 1.00 06/23/2021 1536  ? CALCIUM 8.6 (L) 05/16/2021 0357  ? GFRNONAA >60 05/16/2021 0357  ? GFRAA 74 (L) 07/07/2012 0525  ? ? ?BNP ?No results found for: BNP ? ?ProBNP ?No results found for: PROBNP ? ?Specialty Problems   ?None ? ? ?No Known Allergies ? ?Immunization History  ?Administered Date(s) Administered  ? Influenza-Unspecified 12/06/2010, 01/01/2012  ? Pneumococcal-Unspecified 12/06/2010  ? ? ?Past Medical History:  ?Diagnosis Date  ? GERD (gastroesophageal reflux disease)   ? Glaucoma   ? Hypertension   ? Schizoaffective disorder, depressive type ([place])   ? "dx'd in the 1990's"  ? Schizophrenia, schizo-affective  ([place])   ? ? ?Tobacco History: ?Social History  ? ?Tobacco Use  ?Smoking Status Never  ?Smokeless Tobacco Never  ? ?Counseling given: Not Answered ? ? ?Continue to not smoke ? ?Outpatient Encounter Medications as of 07/24/2021  ?Medication Sig  ? amLODipine (NORVASC) 10 MG tablet Take 10 mg by mouth daily.  ? atorvastatin (LIPITOR) 40 MG tablet Take 40 mg by mouth daily.  ? carvedilol (COREG) 12.5 MG tablet Take 1 tablet (12.5 mg total) by mouth 2 (two) times daily with a meal.  ? hydrOXYzine (ATARAX/VISTARIL) 50 MG tablet Take 1 tablet (50 mg total) by mouth at bedtime as needed for anxiety (sleep).  ? insulin glargine (LANTUS) 100 UNIT/ML injection   ? losartan (COZAAR) 50 MG tablet Take 50 mg by mouth daily.  ? metFORMIN (GLUCOPHAGE) 500 MG tablet Take 500 mg by mouth daily with breakfast.  ? mirtazapine (REMERON) 15 MG tablet Take 15 mg by mouth at bedtime.  ? Multiple Vitamin (MULTIVITAMIN WITH MINERALS) TABS tablet Take 1 tablet by mouth daily.  ? OLANZapine (ZYPREXA) 15 MG tablet Take 1 tablet (15 mg total) by mouth at bedtime.  ? tadalafil (CIALIS) 20 MG tablet Take 20 mg by mouth daily as needed for erectile dysfunction.  ? timolol (BETIMOL) 0.5 % ophthalmic solution Place 1 drop into both eyes 2 (two) times daily.  ? vitamin C (ASCORBIC ACID) 250 MG tablet Take 250 mg by mouth daily.  ? hydrALAZINE (APRESOLINE) 25 MG tablet Take 1 tablet (25 mg total) by mouth every 8 (eight) hours.  ? traMADol (ULTRAM) 50 MG tablet Take 1 tablet (50 mg total) by mouth every 6 (six) hours as needed. (Patient not taking: Reported on 07/24/2021)  ? [DISCONTINUED] ASPIRIN LOW DOSE 81 MG EC tablet Take 81 mg by mouth daily. (Patient not taking: Reported on 06/17/2020)  ? [DISCONTINUED] omeprazole (PRILOSEC) 20 MG capsule Take 20 mg by mouth daily. (Patient not taking: Reported on 05/27/2021)  ? ?No facility-administered encounter medications on file as of 07/24/2021.  ? ? ? ?Review of Systems ? ?Review of Systems  ?No chest pain  with exertion.  No orthopnea or PND.  Comprehensive review of systems otherwise negative. ?Physical Exam ? ?BP 120/72 (BP Location: Left Arm, Patient Position: Sitting, Cuff Size: Normal)   Pulse 71   Temp 98.2 ?F (36.8 ?C) (Oral)   Ht '5\' 11"'$  (1.803 m)   Wt 206 lb (93.4 kg)   SpO2 95%   BMI 28.73 kg/m?  ? ?Wt Readings from Last 5 Encounters:  ?07/24/21 206 lb (93.4  kg)  ?07/07/21 204 lb (92.5 kg)  ?05/27/21 191 lb 6.4 oz (86.8 kg)  ?05/16/21 191 lb 6.4 oz (86.8 kg)  ?07/16/20 200 lb (90.7 kg)  ? ? ?BMI Readings from Last 5 Encounters:  ?07/24/21 28.73 kg/m?  ?07/07/21 28.45 kg/m?  ?05/27/21 26.69 kg/m?  ?05/16/21 26.69 kg/m?  ?07/16/20 27.89 kg/m?  ? ? ? ?Physical Exam ?General: Well-appearing, no acute distress ?Eyes: EOMI, no icterus ?Neck: Supple, no JVP ?Pulmonary: Clear, normal work of breathing ?Cardiovascular: Regular in rhythm, no murmur ?Abdomen: Nondistended, bowel sounds present ?MSK: No synovitis, joint effusion ?Neuro: Normal gait, no weakness ?Psych: Normal mood, full affect ? ? ?Assessment & Plan:  ? ?Chronic hypoxemic respiratory failure: Following recent hospitalization for aortic dissection.  Parenchyma clear on CT scan 05/12/2021.  No evidence of PE.  Right middle lobe collapse now improved.  We will obtain 6-minute walk test in the coming days to assess need for oxygen moving forward.  If still needed we will continue.  If not we will write order to discontinue. ? ?Right middle lobe collapse: Suspect this transitory was not present on initial scan.  Repeat CT scan 06/2021 with improvement in atelectasis.  No further follow-up. ? ? ?Return if symptoms worsen or fail to improve. ? ? ?Lanier Clam, MD ?07/24/2021 ? ? ? ? ?

## 2021-07-24 NOTE — Patient Instructions (Signed)
Nice to see you again ? ?We will get a 6-minute walk test to walk around the office in the coming days to assess if you still need oxygen or not ? ?Continue oxygen for now ? ?Return to clinic as needed, if you still need oxygen we will schedule a yearly follow-up. ?

## 2021-07-30 ENCOUNTER — Other Ambulatory Visit: Payer: Medicaid Other

## 2021-08-04 ENCOUNTER — Ambulatory Visit: Payer: Medicaid Other

## 2021-08-04 ENCOUNTER — Ambulatory Visit
Admission: RE | Admit: 2021-08-04 | Discharge: 2021-08-04 | Disposition: A | Discharge status: Home / Self Care | Payer: Medicaid Other | Source: Ambulatory Visit | Attending: Vascular Surgery | Admitting: Vascular Surgery

## 2021-08-04 DIAGNOSIS — I7103 Dissection of thoracoabdominal aorta: Secondary | ICD-10-CM

## 2021-08-04 MED ORDER — IOPAMIDOL (ISOVUE-370) INJECTION 76%
75.0000 mL | Freq: Once | INTRAVENOUS | Status: AC | PRN
  Administered 2021-08-04: 75 mL via INTRAVENOUS

## 2021-08-05 ENCOUNTER — Ambulatory Visit: Payer: Medicaid Other | Admitting: Vascular Surgery

## 2021-08-08 ENCOUNTER — Ambulatory Visit: Payer: Medicaid Other

## 2021-12-03 ENCOUNTER — Ambulatory Visit: Payer: Self-pay | Admitting: Cardiology

## 2021-12-10 ENCOUNTER — Ambulatory Visit: Payer: Self-pay | Admitting: Cardiology

## 2021-12-22 ENCOUNTER — Encounter: Payer: Self-pay | Admitting: Cardiology

## 2021-12-22 ENCOUNTER — Ambulatory Visit: Payer: Self-pay | Admitting: Cardiology

## 2021-12-22 VITALS — BP 117/85 | HR 91 | Temp 98.2°F | Resp 16 | Ht 71.0 in | Wt 201.0 lb

## 2021-12-22 DIAGNOSIS — F1411 Cocaine abuse, in remission: Secondary | ICD-10-CM

## 2021-12-22 DIAGNOSIS — E119 Type 2 diabetes mellitus without complications: Secondary | ICD-10-CM

## 2021-12-22 DIAGNOSIS — I10 Essential (primary) hypertension: Secondary | ICD-10-CM

## 2021-12-22 DIAGNOSIS — I7121 Aneurysm of the ascending aorta, without rupture: Secondary | ICD-10-CM

## 2021-12-22 DIAGNOSIS — Z8679 Personal history of other diseases of the circulatory system: Secondary | ICD-10-CM

## 2021-12-22 DIAGNOSIS — R002 Palpitations: Secondary | ICD-10-CM

## 2021-12-22 NOTE — Progress Notes (Signed)
ID:  Jimmy Marshall, DOB 04/27/1955, MRN [id number]  PCP:  Benito Mccreedy, MD  Cardiologist:  Rex Kras, DO, [facility] (established care December 22, 2021)  REASON FOR CONSULT: Heart palpitations/tachycardia  REQUESTING PHYSICIAN:  Benito Mccreedy, MD [address] [place],  [place] [address]  Chief Complaint  Patient presents with   Palpitations   Tachycardia   New Patient (Initial Visit)    Referred by Raelyn Number, PA-C    HPI  Jimmy Marshall is a 66 y.o. African-American male who presents to the clinic for evaluation of palpitations/tachycardia at the request of Osei-Bonsu, Iona Beard, MD. His past medical history and cardiovascular risk factors include: History of type B dissection (follows w/ Dr. Virl Cagey from vascular surgery) and proximal ascending aortic aneurysm (46 mm as of May 2023), history of cocaine use, benign essential hypertension, diabetes mellitus type 2, schizophrenia.   Referred to the practice for evaluation of tachycardia and palpitations. The symptoms have been ongoing since February 2023 when he was diagnosed with type B aortic dissection.  Patient states that his average heart rate is around 100 bpm.  The symptoms of palpitations are intermittent, no precipitating factors, no improving or worsening factors, no syncope or near syncope.  No new medications, no consumption of caffeine (including coffee and soda), no energy drinks, no weight loss supplements or stimulants.  Last use of cocaine was approximately 6 months ago.  And smokes hemps on as needed basis  Patient follows with vascular surgery with regards to both proximal ascending aortic aneurysm and history of type B dissection.  FUNCTIONAL STATUS: Walks approximately 1 mile per day per patient.  ALLERGIES: No Known Allergies  MEDICATION LIST PRIOR TO VISIT: Current Meds  Medication Sig   amLODipine (NORVASC) 10 MG tablet Take 10 mg by mouth daily.   losartan (COZAAR) 50 MG  tablet Take 50 mg by mouth daily.   metFORMIN (GLUCOPHAGE) 500 MG tablet Take 500 mg by mouth 2 (two) times daily with a meal.   mirtazapine (REMERON) 15 MG tablet Take 15 mg by mouth at bedtime.   OLANZapine (ZYPREXA) 10 MG tablet Take 10 mg by mouth at bedtime.   OLANZapine (ZYPREXA) 20 MG tablet Take 20 mg by mouth at bedtime.   omeprazole (PRILOSEC) 20 MG capsule Take 20 mg by mouth daily.     PAST MEDICAL HISTORY: Past Medical History:  Diagnosis Date   GERD (gastroesophageal reflux disease)    Glaucoma    Hypertension    Schizoaffective disorder, depressive type ([place])    "dx'd in the 1990's"   Schizophrenia, schizo-affective ([place])     PAST SURGICAL HISTORY: Past Surgical History:  Procedure Laterality Date   FRACTURE SURGERY  1996   jaw    INCISION AND DRAINAGE / EXCISION THYROGLOSSAL CYST  06/30/11   w/central hyoid resection   THYROGLOSSAL DUCT CYST  06/30/2011   Procedure: THYROGLOSSAL DUCT CYST;  Surgeon: Jerrell Belfast, MD;  Location: [facility];  Service: ENT;  Laterality: N/A;  excision of thyroglossal duct cyst    FAMILY HISTORY: The patient family history includes Cancer in his father; Heart attack in his mother; Hypertension in his mother.  SOCIAL HISTORY:  The patient  reports that he has never smoked. He has never used smokeless tobacco. He reports that he does not currently use alcohol. He reports current drug use. Drugs: Marijuana and Cocaine.  REVIEW OF SYSTEMS: Review of Systems  Cardiovascular:  Positive for palpitations. Negative for chest pain, claudication, dyspnea on exertion,  irregular heartbeat, leg swelling, near-syncope, orthopnea, paroxysmal nocturnal dyspnea and syncope.  Respiratory:  Negative for shortness of breath.   Hematologic/Lymphatic: Negative for bleeding problem.  Musculoskeletal:  Negative for muscle cramps and myalgias.  Neurological:  Negative for dizziness and light-headedness.    PHYSICAL EXAM:    12/22/2021    9:04 AM  07/24/2021    1:37 PM 07/07/2021   10:02 AM  Vitals with BMI  Height '5\' 11"'$  '5\' 11"'$  '5\' 11"'$   Weight 201 lbs 206 lbs 204 lbs  BMI 28.05 13.08 65.78  Systolic 469 629 528  Diastolic 85 72 81  Pulse 91 71 94   Physical Exam  Constitutional: No distress.  Age appropriate, hemodynamically stable.   Neck: No JVD present.  Cardiovascular: Regular rhythm, S1 normal, S2 normal, intact distal pulses and normal pulses. Tachycardia present. Exam reveals no gallop, no S3 and no S4.  No murmur heard. Pulses:      Carotid pulses are 2+ on the right side and 2+ on the left side.      Radial pulses are 2+ on the right side and 2+ on the left side.       Femoral pulses are 2+ on the right side and 2+ on the left side.      Dorsalis pedis pulses are 2+ on the right side and 2+ on the left side.       Posterior tibial pulses are 2+ on the right side and 2+ on the left side.  Pulmonary/Chest: Effort normal and breath sounds normal. No stridor. He has no wheezes. He has no rales.  Abdominal: Soft. Bowel sounds are normal. He exhibits no distension. There is no abdominal tenderness.  Musculoskeletal:        General: No edema.     Cervical back: Neck supple.  Neurological: He is alert and oriented to person, place, and time. He has intact cranial nerves (2-12).  Skin: Skin is warm and moist.   RADIOLOGY CTA chest abdomen pelvis dissection protocol: May 12, 2021 1. 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 2. The dissection is involving the upper right renal artery and the left renal artery with patency of both the true and false lumens. 3. Small amount of pericardial fluid superiorly extending to the beginning of the dissection. 4. Minimal enlargement of the ascending thoracic aorta with a maximum diameter of 4.1 cm. Recommend annual imaging followup by CTA or MRA. This recommendation  follows 2010 ACCF/AHA/AATS/ACR/ASA/SCA/SCAI/SIR/STS/SVM Guidelines for the Diagnosis and Management of Patients with Thoracic Aortic Disease. Circulation. 2010; 121: U132-G401. Aortic aneurysm NOS (ICD10-I71.9) 5. Smaller, more inferior right renal artery without dissection. 6. Mild enlargement of the central pulmonary arteries. This can be seen with pulmonary arterial hypertension. 7. Multiple liver cysts.  CTA abdomen pelvis with and without contrast: March 2023: 1. Improved, near-resolved appearance of prior intramural hematoma within the imaged distal descending thoracic aorta. 2. Question small saccular aneurysm at the proximal celiac trunk. Attention on follow-up. 3. 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   NON-VASCULAR   1. Small adrenal masses, probably benign. No follow-up imaging is recommended. JACR 2017 Aug; 14(8):1038-44, JCAT 2016 Mar-Apr;  2. Cardiomegaly. Hepatic cysts and splenic hemangiomas. Additional incidental, chronic and senescent findings as above.  CTA chest aorta with and without contrast: 08/04/2021 IMPRESSION: Thoracic aortic intramural hematoma noted on prior exam is significantly decreased in size  compared to prior exam. Largest residual component remains within the medial portion of the distal portion of the transverse aortic arch with small probable persistent intramural blood pool as noted on prior exam.   However, 4.6 cm proximal descending thoracic aortic aneurysm is now noted. Recommend semi-annual imaging followup by CTA or MRA and referral to cardiothoracic surgery if not already obtained. This recommendation follows 2010 ACCF/AHA/AATS/ACR/ASA/SCA/SCAI/SIR/STS/SVM Guidelines for the Diagnosis and Management of Patients With Thoracic Aortic Disease. Circulation. 2010; 121: C623-J628. Aortic aneurysm NOS (ICD10-I71.9).  CARDIAC DATABASE: EKG: 12/22/21 Sinus rhythm, 83 bpm, LAE, without underlying ischemia injury  pattern.  Echocardiogram: No results found for this or any previous visit from the past 1095 days.   Stress Testing: No results found for this or any previous visit from the past 1095 days.  Heart Catheterization: None  LABORATORY DATA:    Latest Ref Rng & Units 05/16/2021    3:57 AM 05/15/2021    1:34 AM 05/14/2021    5:56 AM  CBC  WBC 4.0 - 10.5 K/uL 11.7  14.3  14.7   Hemoglobin 13.0 - 17.0 g/dL 11.3  11.5  11.1   Hematocrit 39.0 - 52.0 % 35.2  35.5  33.6   Platelets 150 - 400 K/uL 266  273  237        Latest Ref Rng & Units 06/23/2021    3:36 PM 05/16/2021    3:57 AM 05/15/2021    1:34 AM  CMP  Glucose 70 - 99 mg/dL  138  103   BUN 8 - 23 mg/dL  22  21   Creatinine 0.61 - 1.24 mg/dL 1.00  1.28  1.24   Sodium 135 - 145 mmol/L  137  138   Potassium 3.5 - 5.1 mmol/L  3.4  3.9   Chloride 98 - 111 mmol/L  103  106   CO2 22 - 32 mmol/L  24  24   Calcium 8.9 - 10.3 mg/dL  8.6  8.9   Total Protein 6.5 - 8.1 g/dL  5.9  5.9   Total Bilirubin 0.3 - 1.2 mg/dL  0.8  1.0   Alkaline Phos 38 - 126 U/L  55  44   AST 15 - 41 U/L  21  25   ALT 0 - 44 U/L  39  46     Lipid Panel  No results found for: "CHOL", "TRIG", "HDL", "CHOLHDL", "VLDL", "LDLCALC", "LDLDIRECT", "LABVLDL"  No components found for: "NTPROBNP" No results for input(s): "PROBNP" in the last 8760 hours. Recent Labs    05/13/21 0057  TSH 0.573    BMP Recent Labs    05/14/21 0556 05/15/21 0134 05/16/21 0357 06/23/21 1536  NA 135 138 137  --   K 3.5 3.9 3.4*  --   CL 101 106 103  --   CO2 '24 24 24  '$ --   GLUCOSE 130* 103* 138*  --   BUN '14 21 22  '$ --   CREATININE 1.06 1.24 1.28* 1.00  CALCIUM 8.9 8.9 8.6*  --   GFRNONAA >60 >60 >60  --     HEMOGLOBIN A1C Lab Results  Component Value Date   HGBA1C 5.3 05/13/2021   MPG 105.41 05/13/2021   External Labs: Collected: 11/20/2021. BUN 12, creatinine 0.93. Sodium 143, potassium 3.8, chloride 107, bicarb 26 CK-MB <0.7 TSH 1.02 High sensitive troponin  73   IMPRESSION:    ICD-10-CM   1. Palpitation  R00.2 EKG 12-Lead    LONG TERM MONITOR (3-14  DAYS)    2. History of Type B Aortic dissection (Feb 2023)  Z86.79     3. Aneurysm of ascending aorta without rupture (HCC)  I71.21     4. Benign hypertension  I10 PCV ECHOCARDIOGRAM COMPLETE    5. Non-insulin dependent type 2 diabetes mellitus ([place])  E11.9     6. Hx of cocaine abuse ([place])  F14.11        RECOMMENDATIONS: Jimmy Marshall is a 65 y.o. African-American male whose past medical history and cardiac risk factors include: History of type B dissection (follows w/ Dr. Virl Cagey from vascular surgery) and proximal ascending aortic aneurysm (46 mm as of May 2023), history of cocaine use, benign essential hypertension, diabetes mellitus type 2, schizophrenia.   Palpitation No identifiable reversible cause. Mildly tachycardic on physical examination without any significant ectopy. Zio patch for 1 week to evaluate for underlying dysrhythmias. If unremarkable we will start carvedilol.  History of Type B Aortic dissection (Feb 2023) / Aneurysm of ascending aorta without rupture [facility]): History of type B dissection in February 2023. Follow-up study also notes proximal ascending aortic aneurysm. Currently follows up with vascular surgery. Educated on blood pressure management with a goal SBP less than 120 mmHg if able to tolerate. Recommended him to take losartan in the morning and amlodipine at night. We will start carvedilol if the monitor is unremarkable.  Would avoid metoprolol if possible given his history of cocaine use  Benign hypertension Office blood pressures are well controlled. Medications reconciled. No changes warranted at this time. See above for recommendations  Non-insulin dependent type 2 diabetes mellitus ([place]) We educated him on the importance of glycemic control. Recommend statin therapy given his non-insulin-dependent diabetes-we will defer to PCP  Hx of cocaine  abuse ([place]) Reemphasized the importance of complete cocaine cessation.  Data Reviewed: I have independently reviewed external notes provided by the referring provider as part of this office visit.   I have independently reviewed labs provided by PCP, EKG, CT results, last discharge summary from February 2023 as part of medical decision making. I have ordered the following tests:  Orders Placed This Encounter  Procedures   LONG TERM MONITOR (3-14 DAYS)    Standing Status:   Future    Order Specific Question:   Where should this test be performed?    Answer:   [facility]    Order Specific Question:   Does the patient have an implanted cardiac device?    Answer:   No    Order Specific Question:   Prescribed days of wear    Answer:   7    Order Specific Question:   Type of enrollment    Answer:   Clinic Enrollment   EKG 12-Lead   PCV ECHOCARDIOGRAM COMPLETE    Standing Status:   Future    Standing Expiration Date:   12/23/2022  I have made no medications changes at today's encounter as noted above.  FINAL MEDICATION LIST END OF ENCOUNTER: No orders of the defined types were placed in this encounter.   Current Outpatient Medications:    amLODipine (NORVASC) 10 MG tablet, Take 10 mg by mouth daily., Disp: , Rfl:    losartan (COZAAR) 50 MG tablet, Take 50 mg by mouth daily., Disp: , Rfl:    metFORMIN (GLUCOPHAGE) 500 MG tablet, Take 500 mg by mouth 2 (two) times daily with a meal., Disp: , Rfl:    mirtazapine (REMERON) 15 MG tablet, Take 15 mg by mouth at bedtime., Disp: ,  Rfl:    OLANZapine (ZYPREXA) 10 MG tablet, Take 10 mg by mouth at bedtime., Disp: , Rfl:    OLANZapine (ZYPREXA) 20 MG tablet, Take 20 mg by mouth at bedtime., Disp: , Rfl:    omeprazole (PRILOSEC) 20 MG capsule, Take 20 mg by mouth daily., Disp: , Rfl:   Orders Placed This Encounter  Procedures   LONG TERM MONITOR (3-14 DAYS)   EKG 12-Lead   PCV ECHOCARDIOGRAM COMPLETE    There are no Patient  Instructions on file for this visit.   --Continue cardiac medications as reconciled in final medication list. --Return in about 6 weeks (around 02/02/2022) for Follow up, Review test results. or sooner if needed. --Continue follow-up with your primary care physician regarding the management of your other chronic comorbid conditions.  Patient's questions and concerns were addressed to his satisfaction. He voices understanding of the instructions provided during this encounter.   This note was created using a voice recognition software as a result there may be grammatical errors inadvertently enclosed that do not reflect the nature of this encounter. Every attempt is made to correct such errors.  Rex Kras, [place], [facility]  Pager: [phone] Office: [phone]

## 2022-01-05 ENCOUNTER — Ambulatory Visit (HOSPITAL_COMMUNITY)
Admission: RE | Admit: 2022-01-05 | Discharge: 2022-01-05 | Disposition: A | Discharge status: Home / Self Care | Payer: Medicaid Other | Source: Ambulatory Visit | Attending: Vascular Surgery | Admitting: Vascular Surgery

## 2022-01-05 ENCOUNTER — Encounter (HOSPITAL_COMMUNITY): Payer: Self-pay

## 2022-01-08 NOTE — Progress Notes (Deleted)
Office Note    HPI: Karel Turpen is a 66 y.o. ([birth year]) male presenting in follow-up after being evaluated in the emergency department with a zone 2 through 5 aortic intramural hematoma. Tox screen at the time was positive, however, Zamauri noted he had not used cocaine in several days. He was treated in the hospital with anti and pulse control, which resolved his symptoms.  He was converted to oral therapy for outpatient follow-up.  Ryatt presents today, feeling well.  He has not required supplemental oxygen, which she was discharged from the hospital with.  He denies abdominal pain back pain, chest pain.  The pt is  on a statin for cholesterol management.  The pt is  on a daily aspirin.   Other AC:  - The pt is  on medication for hypertension.   The pt is  diabetic.  Tobacco hx:  -  Past Medical History:  Diagnosis Date   GERD (gastroesophageal reflux disease)    Glaucoma    Hypertension    Schizoaffective disorder, depressive type ([place])    "dx'd in the 1990's"   Schizophrenia, schizo-affective ([place])     Past Surgical History:  Procedure Laterality Date   FRACTURE SURGERY  1996   jaw    INCISION AND DRAINAGE / EXCISION THYROGLOSSAL CYST  06/30/11   w/central hyoid resection   THYROGLOSSAL DUCT CYST  06/30/2011   Procedure: THYROGLOSSAL DUCT CYST;  Surgeon: Jerrell Belfast, MD;  Location: [facility] OR;  Service: ENT;  Laterality: N/A;  excision of thyroglossal duct cyst    Social History   Socioeconomic History   Marital status: Divorced    Spouse name: Not on file   Number of children: Not on file   Years of education: Not on file   Highest education level: Not on file  Occupational History   Not on file  Tobacco Use   Smoking status: Never   Smokeless tobacco: Never  Vaping Use   Vaping Use: Never used  Substance and Sexual Activity   Alcohol use: Not Currently    Comment: "stopped drinking 1999"   Drug use: Yes    Types: Marijuana, Cocaine    Comment: "last  marijuana 1990's; last  recreational cocaine 1999"   Sexual activity: Never  Other Topics Concern   Not on file  Social History Narrative   Not on file   Social Determinants of Health   Financial Resource Strain: Not on file  Food Insecurity: Not on file  Transportation Needs: Not on file  Physical Activity: Not on file  Stress: Not on file  Social Connections: Not on file  Intimate Partner Violence: Not on file   Family History  Problem Relation Age of Onset   Hypertension Mother    Heart attack Mother    Cancer Father     Current Outpatient Medications  Medication Sig Dispense Refill   amLODipine (NORVASC) 10 MG tablet Take 10 mg by mouth daily.     losartan (COZAAR) 50 MG tablet Take 50 mg by mouth daily.     metFORMIN (GLUCOPHAGE) 500 MG tablet Take 500 mg by mouth 2 (two) times daily with a meal.     mirtazapine (REMERON) 15 MG tablet Take 15 mg by mouth at bedtime.     OLANZapine (ZYPREXA) 10 MG tablet Take 10 mg by mouth at bedtime.     OLANZapine (ZYPREXA) 20 MG tablet Take 20 mg by mouth at bedtime.     omeprazole (PRILOSEC) 20 MG capsule Take 20  mg by mouth daily.     No current facility-administered medications for this visit.    No Known Allergies   REVIEW OF SYSTEMS:  '[X]'$  denotes positive finding, '[ ]'$  denotes negative finding Cardiac  Comments:  Chest pain or chest pressure:    Shortness of breath upon exertion:    Short of breath when lying flat:    Irregular heart rhythm:        Vascular    Pain in calf, thigh, or hip brought on by ambulation:    Pain in feet at night that wakes you up from your sleep:     Blood clot in your veins:    Leg swelling:         Pulmonary    Oxygen at home:    Productive cough:     Wheezing:         Neurologic    Sudden weakness in arms or legs:     Sudden numbness in arms or legs:     Sudden onset of difficulty speaking or slurred speech:    Temporary loss of vision in one eye:     Problems with dizziness:          Gastrointestinal    Blood in stool:     Vomited blood:         Genitourinary    Burning when urinating:     Blood in urine:        Psychiatric    Major depression:         Hematologic    Bleeding problems:    Problems with blood clotting too easily:        Skin    Rashes or ulcers:        Constitutional    Fever or chills:      PHYSICAL EXAMINATION:  There were no vitals filed for this visit.   General:  WDWN in NAD; vital signs documented above Gait: Not observed HENT: WNL, normocephalic Pulmonary: normal non-labored breathing , without wheezing Cardiac: regular HR Abdomen: soft, NT, no masses Skin: without rashes Vascular Exam/Pulses:  Right Left  Radial 2+ (normal) 2+ (normal)  Ulnar 2+ (normal) 2+ (normal)  Femoral    Popliteal    DP 2+ (normal) 2+ (normal)  PT 2+ (normal) 2+ (normal)   Extremities: without ischemic changes, without Gangrene , without cellulitis; without open wounds;  Musculoskeletal: no muscle wasting or atrophy  Neurologic: A&O X 3;  No focal weakness or paresthesias are detected Psychiatric:  The pt has Normal affect.   Non-Invasive Vascular Imaging:    See CT    ASSESSMENT/PLAN: Kashmere Staffa is a 66 y.o. male presenting in follow-up status post acute zone 2-5 aortic intramural hematoma.  Since discharge, Jahshua has been doing well, with no issues.  He is asymptomatic. Imaging was reviewed demonstrating resolution of the aortic hematoma in zone 5.  The chest was not evaluated in the CT scan.  He will need follow-up imaging to assess zones to through 4.  Also in the abdominal CT, there is a small luminal irregularity at the takeoff of the celiac artery.  This is small, and will be reimaged on the CT angio chest that is ordered.  Pending results of the above study, will likely follow Dsean up in 6 months with repeat CT scan to assess for aneurysmal degeneration.  His ascending aorta is 4.1 cm in size, and  unchanged.   Broadus John, MD [facility] [phone]

## 2022-01-09 ENCOUNTER — Ambulatory Visit: Payer: Medicaid Other | Admitting: Vascular Surgery

## 2022-01-21 ENCOUNTER — Ambulatory Visit: Payer: Self-pay

## 2022-01-21 DIAGNOSIS — R002 Palpitations: Secondary | ICD-10-CM

## 2022-01-21 DIAGNOSIS — I10 Essential (primary) hypertension: Secondary | ICD-10-CM

## 2022-01-29 NOTE — Progress Notes (Signed)
I tried calling patient no answer left a vm

## 2022-01-30 NOTE — Progress Notes (Signed)
Second attempt: Called about results but pt didn't answer. Left vm to call back.

## 2022-02-03 ENCOUNTER — Encounter: Payer: Self-pay | Admitting: Cardiology

## 2022-02-03 ENCOUNTER — Ambulatory Visit: Payer: Self-pay | Admitting: Cardiology

## 2022-02-03 VITALS — BP 135/85 | HR 78 | Temp 98.7°F | Resp 16 | Ht 71.0 in | Wt 201.0 lb

## 2022-02-03 DIAGNOSIS — F1410 Cocaine abuse, uncomplicated: Secondary | ICD-10-CM

## 2022-02-03 DIAGNOSIS — I10 Essential (primary) hypertension: Secondary | ICD-10-CM

## 2022-02-03 DIAGNOSIS — E119 Type 2 diabetes mellitus without complications: Secondary | ICD-10-CM

## 2022-02-03 DIAGNOSIS — I7121 Aneurysm of the ascending aorta, without rupture: Secondary | ICD-10-CM

## 2022-02-03 DIAGNOSIS — R002 Palpitations: Secondary | ICD-10-CM

## 2022-02-03 DIAGNOSIS — Z8679 Personal history of other diseases of the circulatory system: Secondary | ICD-10-CM

## 2022-02-03 MED ORDER — LOSARTAN POTASSIUM 100 MG PO TABS: 100.0000 mg | ORAL_TABLET | Freq: Every morning | ORAL | 0 refills | Status: DC

## 2022-02-03 NOTE — Progress Notes (Signed)
ID:  Jimmy Marshall, DOB Mar 13, 1956, MRN [id number]  PCP:  Benito Mccreedy, MD  Cardiologist:  Rex Kras, DO, [facility] (established care December 22, 2021)  Date: 02/03/22 Last Office Visit: 12/22/2021  Chief Complaint  Patient presents with   Results   Follow-up    HPI  Jimmy Marshall is a 66 y.o. African-American male whose past medical history and cardiovascular risk factors include: History of type B dissection (follows w/ Dr. Virl Cagey from vascular surgery) and proximal ascending aortic aneurysm (46 mm as of May 2023), cocaine use, benign essential hypertension, diabetes mellitus type 2, schizoaffective disorder.   Referred to the practice for evaluation of tachycardia/palpitations.  At the last office visit he was recommended to undergo an echocardiogram and Zio patch to evaluate for structural heart disease and underlying dysrhythmias.  Echocardiogram results reviewed and noted below for further reference.  He is currently still wearing the Zio patch and will be mailing it in tomorrow.  Symptomatically patient states that the palpitations have now resolved.  He checks his blood pressure at home daily and the systolics are 528 mmHg.  Last use of cocaine last month and he uses hemp twice a week.  Given his history of type II aortic dissection and proximal ascending aortic aneurysm at 46 mm he follows up with Dr. Unk Lightning.  Clinically denies anginal discomfort, heart failure symptoms, near-syncope or syncope.  FUNCTIONAL STATUS: Walks approximately 1 mile per day per patient.  ALLERGIES: No Known Allergies  MEDICATION LIST PRIOR TO VISIT: Current Meds  Medication Sig   amLODipine (NORVASC) 10 MG tablet Take 10 mg by mouth daily.   metFORMIN (GLUCOPHAGE) 500 MG tablet Take 500 mg by mouth 2 (two) times daily with a meal.   mirtazapine (REMERON) 15 MG tablet Take 15 mg by mouth at bedtime.   OLANZapine (ZYPREXA) 10 MG tablet Take 10 mg by mouth at bedtime.    OLANZapine (ZYPREXA) 20 MG tablet Take 20 mg by mouth at bedtime.   omeprazole (PRILOSEC) 20 MG capsule Take 20 mg by mouth daily.   [DISCONTINUED] losartan (COZAAR) 50 MG tablet Take 50 mg by mouth daily.     PAST MEDICAL HISTORY: Past Medical History:  Diagnosis Date   GERD (gastroesophageal reflux disease)    Glaucoma    Hypertension    Schizoaffective disorder, depressive type ([place])    "dx'd in the 1990's"   Schizophrenia, schizo-affective ([place])     PAST SURGICAL HISTORY: Past Surgical History:  Procedure Laterality Date   FRACTURE SURGERY  1996   jaw    INCISION AND DRAINAGE / EXCISION THYROGLOSSAL CYST  06/30/11   w/central hyoid resection   THYROGLOSSAL DUCT CYST  06/30/2011   Procedure: THYROGLOSSAL DUCT CYST;  Surgeon: Jerrell Belfast, MD;  Location: [facility];  Service: ENT;  Laterality: N/A;  excision of thyroglossal duct cyst    FAMILY HISTORY: The patient family history includes Cancer in his father; Heart attack in his mother; Hypertension in his mother.  SOCIAL HISTORY:  The patient  reports that he has never smoked. He has never used smokeless tobacco. He reports that he does not currently use alcohol. He reports current drug use. Drugs: Marijuana and Cocaine.  REVIEW OF SYSTEMS: Review of Systems  Cardiovascular:  Negative for chest pain, claudication, dyspnea on exertion, irregular heartbeat, leg swelling, near-syncope, orthopnea, palpitations, paroxysmal nocturnal dyspnea and syncope.  Respiratory:  Negative for shortness of breath.   Hematologic/Lymphatic: Negative for bleeding problem.  Musculoskeletal:  Negative for muscle cramps and  myalgias.  Neurological:  Negative for dizziness and light-headedness.   PHYSICAL EXAM:    02/03/2022    2:42 PM 12/22/2021    9:04 AM 07/24/2021    1:37 PM  Vitals with BMI  Height '5\' 11"'$  '5\' 11"'$  '5\' 11"'$   Weight 201 lbs 201 lbs 206 lbs  BMI 28.05 91.69 45.03  Systolic 888 280 034  Diastolic 85 85 72  Pulse 78 91 71     Physical Exam  Constitutional: No distress.  Age appropriate, hemodynamically stable.   Neck: No JVD present.  Cardiovascular: Normal rate, regular rhythm, S1 normal, S2 normal, intact distal pulses and normal pulses. Exam reveals no gallop, no S3 and no S4.  No murmur heard. Pulses:      Carotid pulses are 2+ on the right side and 2+ on the left side.      Femoral pulses are 2+ on the right side and 2+ on the left side.      Popliteal pulses are 2+ on the right side and 2+ on the left side.       Dorsalis pedis pulses are 2+ on the right side and 2+ on the left side.       Posterior tibial pulses are 2+ on the right side and 2+ on the left side.  Pulmonary/Chest: Effort normal and breath sounds normal. No stridor. He has no wheezes. He has no rales.  Abdominal: Soft. Bowel sounds are normal. He exhibits no distension. There is no abdominal tenderness.  Musculoskeletal:        General: No edema.     Cervical back: Neck supple.  Neurological: He is alert and oriented to person, place, and time. He has intact cranial nerves (2-12).  Skin: Skin is warm and moist.   RADIOLOGY CTA chest abdomen pelvis dissection protocol: May 12, 2021 1. 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 2. The dissection is involving the upper right renal artery and the left renal artery with patency of both the true and false lumens. 3. Small amount of pericardial fluid superiorly extending to the beginning of the dissection. 4. Minimal enlargement of the ascending thoracic aorta with a maximum diameter of 4.1 cm. Recommend annual imaging followup by CTA or MRA. This recommendation follows 2010 ACCF/AHA/AATS/ACR/ASA/SCA/SCAI/SIR/STS/SVM Guidelines for the Diagnosis and Management of Patients with Thoracic Aortic Disease. Circulation. 2010; 121: J179-X505. Aortic aneurysm NOS  (ICD10-I71.9) 5. Smaller, more inferior right renal artery without dissection. 6. Mild enlargement of the central pulmonary arteries. This can be seen with pulmonary arterial hypertension. 7. Multiple liver cysts.  CTA abdomen pelvis with and without contrast: March 2023: 1. Improved, near-resolved appearance of prior intramural hematoma within the imaged distal descending thoracic aorta. 2. Question small saccular aneurysm at the proximal celiac trunk. Attention on follow-up. 3. 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   NON-VASCULAR   1. Small adrenal masses, probably benign. No follow-up imaging is recommended. JACR 2017 Aug; 14(8):1038-44, JCAT 2016 Mar-Apr;  2. Cardiomegaly. Hepatic cysts and splenic hemangiomas. Additional incidental, chronic and senescent findings as above.  CTA chest aorta with and without contrast: 08/04/2021 IMPRESSION: Thoracic aortic intramural hematoma noted on prior exam is significantly decreased in size compared to prior exam. Largest residual component remains within the medial portion of the distal portion of the transverse aortic arch with small probable persistent intramural blood pool as noted on prior exam.  However, 4.6 cm proximal descending thoracic aortic aneurysm is now noted. Recommend semi-annual imaging followup by CTA or MRA and referral to cardiothoracic surgery if not already obtained. This recommendation follows 2010 ACCF/AHA/AATS/ACR/ASA/SCA/SCAI/SIR/STS/SVM Guidelines for the Diagnosis and Management of Patients With Thoracic Aortic Disease. Circulation. 2010; 121: F643-P295. Aortic aneurysm NOS (ICD10-I71.9).  CARDIAC DATABASE: EKG: 12/22/21 Sinus rhythm, 83 bpm, LAE, without underlying ischemia injury pattern.  Echocardiogram: 01/21/2022:  Normal LV systolic function with visual EF 55-60%. Left ventricle cavity is normal in size. Mild concentric hypertrophy of the left ventricle. Normal global wall motion. Normal diastolic  filling pattern, normal LAP.  Calculated EF 62%.  Left atrial cavity is mildly dilated at 4.2 cm.  Structurally normal tricuspid valve.  Mild tricuspid regurgitation. No evidence of pulmonary hypertension.  No prior available for comparison.    Stress Testing: No results found for this or any previous visit from the past 1095 days.  Heart Catheterization: None  LABORATORY DATA:    Latest Ref Rng & Units 05/16/2021    3:57 AM 05/15/2021    1:34 AM 05/14/2021    5:56 AM  CBC  WBC 4.0 - 10.5 K/uL 11.7  14.3  14.7   Hemoglobin 13.0 - 17.0 g/dL 11.3  11.5  11.1   Hematocrit 39.0 - 52.0 % 35.2  35.5  33.6   Platelets 150 - 400 K/uL 266  273  237        Latest Ref Rng & Units 06/23/2021    3:36 PM 05/16/2021    3:57 AM 05/15/2021    1:34 AM  CMP  Glucose 70 - 99 mg/dL  138  103   BUN 8 - 23 mg/dL  22  21   Creatinine 0.61 - 1.24 mg/dL 1.00  1.28  1.24   Sodium 135 - 145 mmol/L  137  138   Potassium 3.5 - 5.1 mmol/L  3.4  3.9   Chloride 98 - 111 mmol/L  103  106   CO2 22 - 32 mmol/L  24  24   Calcium 8.9 - 10.3 mg/dL  8.6  8.9   Total Protein 6.5 - 8.1 g/dL  5.9  5.9   Total Bilirubin 0.3 - 1.2 mg/dL  0.8  1.0   Alkaline Phos 38 - 126 U/L  55  44   AST 15 - 41 U/L  21  25   ALT 0 - 44 U/L  39  46     Lipid Panel  No results found for: "CHOL", "TRIG", "HDL", "CHOLHDL", "VLDL", "LDLCALC", "LDLDIRECT", "LABVLDL"  No components found for: "NTPROBNP" No results for input(s): "PROBNP" in the last 8760 hours. Recent Labs    05/13/21 0057  TSH 0.573     BMP Recent Labs    05/14/21 0556 05/15/21 0134 05/16/21 0357 06/23/21 1536  NA 135 138 137  --   K 3.5 3.9 3.4*  --   CL 101 106 103  --   CO2 '24 24 24  '$ --   GLUCOSE 130* 103* 138*  --   BUN '14 21 22  '$ --   CREATININE 1.06 1.24 1.28* 1.00  CALCIUM 8.9 8.9 8.6*  --   GFRNONAA >60 >60 >60  --      HEMOGLOBIN A1C Lab Results  Component Value Date   HGBA1C 5.3 05/13/2021   MPG 105.41 05/13/2021   External  Labs: Collected: 11/20/2021. BUN 12, creatinine 0.93. Sodium 143, potassium 3.8, chloride 107, bicarb 26 CK-MB <0.7 TSH 1.02 High sensitive troponin 73  IMPRESSION:    ICD-10-CM   1. Palpitation  R00.2     2. History of aortic dissection  Z86.79 losartan (COZAAR) 100 MG tablet    3. Aneurysm of ascending aorta without rupture (HCC)  I71.21 losartan (COZAAR) 100 MG tablet    4. Benign hypertension  I10 losartan (COZAAR) 100 MG tablet    Basic metabolic panel    5. Non-insulin dependent type 2 diabetes mellitus ([place])  E11.9     6. Cocaine abuse ([place])  F14.10        RECOMMENDATIONS: Jimmy Marshall is a 66 y.o. African-American male whose past medical history and cardiac risk factors include: History of type B dissection (follows w/ Dr. Virl Cagey from vascular surgery) and proximal ascending aortic aneurysm (46 mm as of May 2023), cocaine use, benign essential hypertension, diabetes mellitus type 2, schizoaffective disorder.   Palpitation Asymptomatic Zio patch report forthcoming-patient is still wearing the monitor mailed today and tomorrow. Monitor for now  History of Type B Aortic dissection (Feb 2023) / Aneurysm of ascending aorta without rupture [facility]): History of type B dissection in February 2023. Follow-up study also notes proximal ascending aortic aneurysm. Currently follows up with vascular surgery -reemphasized importance of following through with Dr. Unk Lightning. Educated on blood pressure management with a goal SBP less than 120 mmHg if able to tolerate. Increase losartan to 100 mg p.o. daily  Labs in 1 week to evaluate kidney function and electrolytes  Ideally would benefit from metoprolol given his history; however, will avoid given his concurrent use of cocaine.    Benign hypertension Office blood pressures are within acceptable range; however, given his history would recommend goals SBP of 120-143mHg if able to tolerate. Medication changes as noted above  See  above for recommendations  Non-insulin dependent type 2 diabetes mellitus ([place] educated him on the importance of glycemic control. Recommend statin therapy given his non-insulin-dependent diabetes-we will defer to PCP  Cocaine abuse ([place] / Hemp use Reemphasized the importance of complete cocaine and hemp cessation.  FINAL MEDICATION LIST END OF ENCOUNTER: Meds ordered this encounter  Medications   losartan (COZAAR) 100 MG tablet    Sig: Take 1 tablet (100 mg total) by mouth in the morning.    Dispense:  30 tablet    Refill:  0    Current Outpatient Medications:    amLODipine (NORVASC) 10 MG tablet, Take 10 mg by mouth daily., Disp: , Rfl:    metFORMIN (GLUCOPHAGE) 500 MG tablet, Take 500 mg by mouth 2 (two) times daily with a meal., Disp: , Rfl:    mirtazapine (REMERON) 15 MG tablet, Take 15 mg by mouth at bedtime., Disp: , Rfl:    OLANZapine (ZYPREXA) 10 MG tablet, Take 10 mg by mouth at bedtime., Disp: , Rfl:    OLANZapine (ZYPREXA) 20 MG tablet, Take 20 mg by mouth at bedtime., Disp: , Rfl:    omeprazole (PRILOSEC) 20 MG capsule, Take 20 mg by mouth daily., Disp: , Rfl:    losartan (COZAAR) 100 MG tablet, Take 1 tablet (100 mg total) by mouth in the morning., Disp: 30 tablet, Rfl: 0  Orders Placed This Encounter  Procedures   Basic metabolic panel    There are no Patient Instructions on file for this visit.   --Continue cardiac medications as reconciled in final medication list. --Return in about 6 months (around 08/04/2022) for Follow up. or sooner if needed. --Continue follow-up with your primary care physician regarding the management of your other chronic  comorbid conditions.  Patient's questions and concerns were addressed to his satisfaction. He voices understanding of the instructions provided during this encounter.   This note was created using a voice recognition software as a result there may be grammatical errors inadvertently enclosed that do not reflect the  nature of this encounter. Every attempt is made to correct such errors.  Rex Kras, [place], [facility]  Pager: [phone] Office: [phone]

## 2022-03-11 ENCOUNTER — Other Ambulatory Visit: Payer: Self-pay

## 2022-03-11 MED ORDER — SPIRONOLACTONE 25 MG PO TABS: 25.0000 mg | ORAL_TABLET | Freq: Every day | ORAL | 0 refills | Status: DC

## 2022-03-11 MED ORDER — DILTIAZEM HCL ER COATED BEADS 240 MG PO CP24: 240.0000 mg | ORAL_CAPSULE | Freq: Every day | ORAL | 0 refills | Status: DC

## 2022-03-11 NOTE — Progress Notes (Signed)
Called and spoke with patient regarding his monitor results. Rx has been sent.

## 2022-03-21 ENCOUNTER — Other Ambulatory Visit: Payer: Self-pay | Admitting: Cardiology

## 2022-03-21 DIAGNOSIS — I10 Essential (primary) hypertension: Secondary | ICD-10-CM

## 2022-08-03 ENCOUNTER — Ambulatory Visit: Payer: No Typology Code available for payment source | Admitting: Cardiology

## 2022-08-10 ENCOUNTER — Encounter: Payer: Self-pay | Admitting: Cardiology

## 2022-08-10 ENCOUNTER — Ambulatory Visit: Payer: No Typology Code available for payment source | Admitting: Cardiology

## 2022-08-10 VITALS — BP 130/86 | HR 73 | Resp 17 | Ht 71.0 in | Wt 213.0 lb

## 2022-08-10 DIAGNOSIS — F1410 Cocaine abuse, uncomplicated: Secondary | ICD-10-CM

## 2022-08-10 DIAGNOSIS — R002 Palpitations: Secondary | ICD-10-CM

## 2022-08-10 DIAGNOSIS — Z8679 Personal history of other diseases of the circulatory system: Secondary | ICD-10-CM

## 2022-08-10 DIAGNOSIS — E119 Type 2 diabetes mellitus without complications: Secondary | ICD-10-CM

## 2022-08-10 DIAGNOSIS — I10 Essential (primary) hypertension: Secondary | ICD-10-CM

## 2022-08-10 DIAGNOSIS — I7121 Aneurysm of the ascending aorta, without rupture: Secondary | ICD-10-CM

## 2022-08-10 MED ORDER — SPIRONOLACTONE 25 MG PO TABS
25.0000 mg | ORAL_TABLET | Freq: Every morning | ORAL | 0 refills | Status: DC
Start: 2022-08-10 — End: 2022-09-06

## 2022-08-10 MED ORDER — DILTIAZEM HCL ER COATED BEADS 240 MG PO CP24: 240.0000 mg | ORAL_CAPSULE | Freq: Every morning | ORAL | 0 refills | Status: DC

## 2022-08-10 MED ORDER — LOSARTAN POTASSIUM 100 MG PO TABS
100.0000 mg | ORAL_TABLET | Freq: Every day | ORAL | 0 refills | Status: DC
Start: 2022-08-10 — End: 2022-09-06

## 2022-08-10 NOTE — Progress Notes (Signed)
ID:  [name], DOB 09/02/1955, MRN [id number]  PCP:  Orvilla Cornwall, MD  Cardiologist:  Tessa Lerner, DO, [facility] (established care December 22, 2021)  Date: 08/10/22 Last Office Visit: 02/03/2022  Chief Complaint  Patient presents with   Palpitations   Follow-up    HPI  Jimmy Marshall is a 67 y.o. African-American male whose past medical history and cardiovascular risk factors include: History of type B dissection (follows w/ Dr. Karin Lieu from vascular surgery) and proximal ascending aortic aneurysm (46 mm as of May 2023), cocaine use, benign essential hypertension, diabetes mellitus type 2, schizoaffective disorder.   Patient is being followed by the practice for palpitations/tachycardia.  In the past he did undergo Zio patch which noted underlying rhythm to be sinus & sinus tachycardia burden was 25%.  He was recommended to discontinue amlodipine and start diltiazem (but is still pending).   Given his history of type B dissection and proximal ascending aortic aneurysm I have reinforced importance of blood pressure management.  Patient states that his home blood pressures are ranging between 124-130 mmHg on the current dose of losartan & amlodipine.  Beta-blockers have been not recommended given his history of cocaine.  Patient states that his last use of cocaine was in September 2023.  He uses hemp regularly to manage his schizoaffective disorder/mood.   He denies anginal chest pain or heart failure symptoms.  FUNCTIONAL STATUS: Walks approximately 1 mile per day per patient.  ALLERGIES: No Known Allergies  MEDICATION LIST PRIOR TO VISIT: Current Meds  Medication Sig   metFORMIN (GLUCOPHAGE) 500 MG tablet Take 500 mg by mouth 2 (two) times daily with a meal.   mirtazapine (REMERON) 15 MG tablet Take 15 mg by mouth at bedtime.   OLANZapine (ZYPREXA) 10 MG tablet Take 10 mg by mouth at bedtime.   OLANZapine (ZYPREXA) 20 MG tablet Take 20 mg by mouth at bedtime.    [DISCONTINUED] amLODipine (NORVASC) 10 MG tablet Take 10 mg by mouth daily.   [DISCONTINUED] diltiazem (CARTIA XT) 240 MG 24 hr capsule Take 1 capsule (240 mg total) by mouth daily.   [DISCONTINUED] losartan (COZAAR) 100 MG tablet Take 1 tablet (100 mg total) by mouth in the morning.     PAST MEDICAL HISTORY: Past Medical History:  Diagnosis Date   GERD (gastroesophageal reflux disease)    Glaucoma    Hypertension    Schizoaffective disorder, depressive type (HCC)    "dx'd in the 1990's"   Schizophrenia, schizo-affective (HCC)     PAST SURGICAL HISTORY: Past Surgical History:  Procedure Laterality Date   FRACTURE SURGERY  1996   jaw    INCISION AND DRAINAGE / EXCISION THYROGLOSSAL CYST  06/30/11   w/central hyoid resection   THYROGLOSSAL DUCT CYST  06/30/2011   Procedure: THYROGLOSSAL DUCT CYST;  Surgeon: Osborn Coho, MD;  Location: [facility] OR;  Service: ENT;  Laterality: N/A;  excision of thyroglossal duct cyst    FAMILY HISTORY: The patient family history includes Cancer in his father; Heart attack in his mother; Hypertension in his mother.  SOCIAL HISTORY:  The patient  reports that he has never smoked. He has never used smokeless tobacco. He reports current alcohol use. He reports current drug use. Drugs: Marijuana and Cocaine.  REVIEW OF SYSTEMS: Review of Systems  Cardiovascular:  Negative for chest pain, claudication, dyspnea on exertion, irregular heartbeat, leg swelling, near-syncope, orthopnea, palpitations, paroxysmal nocturnal dyspnea and syncope.  Respiratory:  Negative for shortness of breath.   Hematologic/Lymphatic: Negative for  bleeding problem.  Musculoskeletal:  Negative for muscle cramps and myalgias.  Neurological:  Negative for dizziness and light-headedness.   PHYSICAL EXAM:    08/10/2022    3:20 PM 02/03/2022    2:42 PM 12/22/2021    9:04 AM  Vitals with BMI  Height 5\' 11"  5\' 11"  5\' 11"   Weight 213 lbs 201 lbs 201 lbs  BMI 29.72 28.05 28.05   Systolic 130 135 409  Diastolic 86 85 85  Pulse 73 78 91    Physical Exam  Constitutional: No distress.  Age appropriate, hemodynamically stable.   Neck: No JVD present.  Cardiovascular: Normal rate, regular rhythm, S1 normal, S2 normal, intact distal pulses and normal pulses. Exam reveals no gallop, no S3 and no S4.  No murmur heard. Pulses:      Carotid pulses are 2+ on the right side and 2+ on the left side.      Femoral pulses are 2+ on the right side and 2+ on the left side.      Popliteal pulses are 2+ on the right side and 2+ on the left side.       Dorsalis pedis pulses are 2+ on the right side and 2+ on the left side.       Posterior tibial pulses are 2+ on the right side and 2+ on the left side.  Pulmonary/Chest: Effort normal and breath sounds normal. No stridor. He has no wheezes. He has no rales.  Abdominal: Soft. Bowel sounds are normal. He exhibits no distension. There is no abdominal tenderness.  Musculoskeletal:        General: No edema.     Cervical back: Neck supple.  Neurological: He is alert and oriented to person, place, and time. He has intact cranial nerves (2-12).  Skin: Skin is warm and moist.   RADIOLOGY CTA chest abdomen pelvis dissection protocol: May 12, 2021 1. 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 2. The dissection is involving the upper right renal artery and the left renal artery with patency of both the true and false lumens. 3. Small amount of pericardial fluid superiorly extending to the beginning of the dissection. 4. Minimal enlargement of the ascending thoracic aorta with a maximum diameter of 4.1 cm. Recommend annual imaging followup by CTA or MRA. This recommendation follows 2010 ACCF/AHA/AATS/ACR/ASA/SCA/SCAI/SIR/STS/SVM Guidelines for the Diagnosis and Management of Patients with Thoracic Aortic  Disease. Circulation. 2010; 121: W119-J478. Aortic aneurysm NOS (ICD10-I71.9) 5. Smaller, more inferior right renal artery without dissection. 6. Mild enlargement of the central pulmonary arteries. This can be seen with pulmonary arterial hypertension. 7. Multiple liver cysts.  CTA abdomen pelvis with and without contrast: March 2023: 1. Improved, near-resolved appearance of prior intramural hematoma within the imaged distal descending thoracic aorta. 2. Question small saccular aneurysm at the proximal celiac trunk. Attention on follow-up. 3. 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   NON-VASCULAR   1. Small adrenal masses, probably benign. No follow-up imaging is recommended. JACR 2017 Aug; 14(8):1038-44, JCAT 2016 Mar-Apr;  2. Cardiomegaly. Hepatic cysts and splenic hemangiomas. Additional incidental, chronic and senescent findings as above.  CTA chest aorta with and without contrast: 08/04/2021 IMPRESSION: Thoracic aortic intramural hematoma noted on prior exam is significantly decreased in size compared to prior exam. Largest residual component remains within the medial portion of the distal portion of the transverse aortic arch with small  probable persistent intramural blood pool as noted on prior exam.   However, 4.6 cm proximal descending thoracic aortic aneurysm is now noted. Recommend semi-annual imaging followup by CTA or MRA and referral to cardiothoracic surgery if not already obtained. This recommendation follows 2010 ACCF/AHA/AATS/ACR/ASA/SCA/SCAI/SIR/STS/SVM Guidelines for the Diagnosis and Management of Patients With Thoracic Aortic Disease. Circulation. 2010; 121: N629-B284. Aortic aneurysm NOS (ICD10-I71.9).  CARDIAC DATABASE: EKG: Aug 10, 2022: Normal sinus rhythm, 72 bpm, without underlying ischemia injury pattern.  Echocardiogram: 01/21/2022:  Normal LV systolic function with visual EF 55-60%. Left ventricle cavity is normal in size. Mild concentric  hypertrophy of the left ventricle. Normal global wall motion. Normal diastolic filling pattern, normal LAP.  Calculated EF 62%.  Left atrial cavity is mildly dilated at 4.2 cm.  Structurally normal tricuspid valve.  Mild tricuspid regurgitation. No evidence of pulmonary hypertension.  No prior available for comparison.    Stress Testing: No results found for this or any previous visit from the past 1095 days.  Heart Catheterization: None  Cardiac monitor (Zio Patch): 01/21/2022 - 02/04/2022 Dominant rhythm sinus, followed by tachycardia (25% burden). Heart rate 47-174 bpm. Avg HR 90 bpm. No atrial fibrillation, supraventricular tachycardia,high grade AV block, pauses (3 seconds or longer). Total ventricular ectopic burden <1%. 1 asymptomatic episode of NSVT 12 beats, 4.8 seconds in duration, max heart rate 174 bpm, average heart rate 156 bpm.  Total supraventricular ectopic burden <1%. Patient triggered events: 5. Underlying rhythm either sinus or sinus tachycardia without arrhythmia.   LABORATORY DATA:    Latest Ref Rng & Units 05/16/2021    3:57 AM 05/15/2021    1:34 AM 05/14/2021    5:56 AM  CBC  WBC 4.0 - 10.5 K/uL 11.7  14.3  14.7   Hemoglobin 13.0 - 17.0 g/dL 13.2  44.0  10.2   Hematocrit 39.0 - 52.0 % 35.2  35.5  33.6   Platelets 150 - 400 K/uL 266  273  237        Latest Ref Rng & Units 06/23/2021    3:36 PM 05/16/2021    3:57 AM 05/15/2021    1:34 AM  CMP  Glucose 70 - 99 mg/dL  725  366   BUN 8 - 23 mg/dL  22  21   Creatinine 4.40 - 1.24 mg/dL 3.47  4.25  9.56   Sodium 135 - 145 mmol/L  137  138   Potassium 3.5 - 5.1 mmol/L  3.4  3.9   Chloride 98 - 111 mmol/L  103  106   CO2 22 - 32 mmol/L  24  24   Calcium 8.9 - 10.3 mg/dL  8.6  8.9   Total Protein 6.5 - 8.1 g/dL  5.9  5.9   Total Bilirubin 0.3 - 1.2 mg/dL  0.8  1.0   Alkaline Phos 38 - 126 U/L  55  44   AST 15 - 41 U/L  21  25   ALT 0 - 44 U/L  39  46     Lipid Panel  No results found for: "CHOL", "TRIG",  "HDL", "CHOLHDL", "VLDL", "LDLCALC", "LDLDIRECT", "LABVLDL"  No components found for: "NTPROBNP" No results for input(s): "PROBNP" in the last 8760 hours. No results for input(s): "TSH" in the last 8760 hours.   BMP No results for input(s): "NA", "K", "CL", "CO2", "GLUCOSE", "BUN", "CREATININE", "CALCIUM", "GFRNONAA", "GFRAA" in the last 8760 hours.   HEMOGLOBIN A1C Lab Results  Component Value Date   HGBA1C 5.3 05/13/2021  MPG 105.41 05/13/2021   External Labs: Collected: 11/20/2021. BUN 12, creatinine 0.93. Sodium 143, potassium 3.8, chloride 107, bicarb 26 CK-MB <0.7 TSH 1.02 High sensitive troponin 73   IMPRESSION:    ICD-10-CM   1. Benign hypertension  I10 diltiazem (CARTIA XT) 240 MG 24 hr capsule    spironolactone (ALDACTONE) 25 MG tablet    losartan (COZAAR) 100 MG tablet    Basic metabolic panel    2. Palpitation  R00.2 EKG 12-Lead    3. History of aortic dissection  Z86.79 losartan (COZAAR) 100 MG tablet    4. Aneurysm of ascending aorta without rupture (HCC)  I71.21 losartan (COZAAR) 100 MG tablet    5. Non-insulin dependent type 2 diabetes mellitus (HCC)  E11.9     6. Cocaine abuse (HCC)  F14.10        RECOMMENDATIONS: Jimmy Marshall is a 67 y.o. African-American male whose past medical history and cardiac risk factors include: History of type B dissection (follows w/ Dr. Karin Lieu from vascular surgery) and proximal ascending aortic aneurysm (46 mm as of May 2023), cocaine use, benign essential hypertension, diabetes mellitus type 2, schizoaffective disorder.   Benign hypertension Home blood pressures are acceptable and not at goal. Discontinue amlodipine. Start spironolactone 25 mg p.o. every morning. Labs in 1 week to evaluate kidney function and electrolytes. Continue current dose of losartan 100 mg p.o. every afternoon Recommend a goal SBP 120 mmHg.  Palpitation During prior office visits patient has had complaints of  palpitations/tachycardia. In the past patches revealed underlying rhythm to be sinus and a tachycardia burden of approximately 25%.  No significant PAC/PVC burden.  Given his history of cocaine would like to avoid beta-blocker therapy.  He was advised to discontinue amlodipine and start diltiazem in the past with this change still pending. Discontinue amlodipine. Start diltiazem to 40 mg p.o. every morning Monitor for now  History of aortic dissection Aneurysm of ascending aorta without rupture (HCC) History of type B dissection in February 2023. Follows up with Dr. Valrie Hart an upcoming appointment in the next month or 2. Reemphasized the importance of blood pressure management. Last use of cocaine September 2023. However, uses hemp regularly  Non-insulin dependent type 2 diabetes mellitus (HCC) Reemphasized importance of glycemic control. Currently on metformin. In the past I recommended him to discuss statin therapy with PCP -patient will follow through.  FINAL MEDICATION LIST END OF ENCOUNTER: Meds ordered this encounter  Medications   diltiazem (CARTIA XT) 240 MG 24 hr capsule    Sig: Take 1 capsule (240 mg total) by mouth every morning.    Dispense:  30 capsule    Refill:  0   spironolactone (ALDACTONE) 25 MG tablet    Sig: Take 1 tablet (25 mg total) by mouth every morning.    Dispense:  30 tablet    Refill:  0    Please remind patient to have labs drawn 1 week after starting.   losartan (COZAAR) 100 MG tablet    Sig: Take 1 tablet (100 mg total) by mouth daily at 10 pm.    Dispense:  30 tablet    Refill:  0    Current Outpatient Medications:    metFORMIN (GLUCOPHAGE) 500 MG tablet, Take 500 mg by mouth 2 (two) times daily with a meal., Disp: , Rfl:    mirtazapine (REMERON) 15 MG tablet, Take 15 mg by mouth at bedtime., Disp: , Rfl:    OLANZapine (ZYPREXA) 10 MG tablet, Take 10 mg by mouth  at bedtime., Disp: , Rfl:    OLANZapine (ZYPREXA) 20 MG tablet, Take 20 mg by  mouth at bedtime., Disp: , Rfl:    diltiazem (CARTIA XT) 240 MG 24 hr capsule, Take 1 capsule (240 mg total) by mouth every morning., Disp: 30 capsule, Rfl: 0   losartan (COZAAR) 100 MG tablet, Take 1 tablet (100 mg total) by mouth daily at 10 pm., Disp: 30 tablet, Rfl: 0   spironolactone (ALDACTONE) 25 MG tablet, Take 1 tablet (25 mg total) by mouth every morning., Disp: 30 tablet, Rfl: 0  Orders Placed This Encounter  Procedures   Basic metabolic panel   EKG 12-Lead    There are no Patient Instructions on file for this visit.   --Continue cardiac medications as reconciled in final medication list. --Return in about 5 weeks (around 09/14/2022) for Follow up, BP. or sooner if needed. --Continue follow-up with your primary care physician regarding the management of your other chronic comorbid conditions.  Patient's questions and concerns were addressed to his satisfaction. He voices understanding of the instructions provided during this encounter.   This note was created using a voice recognition software as a result there may be grammatical errors inadvertently enclosed that do not reflect the nature of this encounter. Every attempt is made to correct such errors.  Tessa Lerner, [place], [facility]  Pager:  [phone] Office: [phone]

## 2022-08-13 ENCOUNTER — Other Ambulatory Visit: Payer: Self-pay

## 2022-08-13 DIAGNOSIS — I10 Essential (primary) hypertension: Secondary | ICD-10-CM

## 2022-08-26 ENCOUNTER — Inpatient Hospital Stay (HOSPITAL_COMMUNITY)
Admission: EM | Admit: 2022-08-26 | Discharge: 2022-10-05 | DRG: 023 | Disposition: E | Payer: No Typology Code available for payment source | Attending: Pulmonary Disease | Admitting: Pulmonary Disease

## 2022-08-26 ENCOUNTER — Inpatient Hospital Stay (HOSPITAL_COMMUNITY): Payer: No Typology Code available for payment source

## 2022-08-26 ENCOUNTER — Emergency Department (HOSPITAL_COMMUNITY): Payer: No Typology Code available for payment source

## 2022-08-26 ENCOUNTER — Encounter (HOSPITAL_COMMUNITY): Payer: Self-pay | Admitting: Neurological Surgery

## 2022-08-26 ENCOUNTER — Other Ambulatory Visit: Payer: Self-pay

## 2022-08-26 DIAGNOSIS — Z515 Encounter for palliative care: Secondary | ICD-10-CM | POA: Diagnosis not present

## 2022-08-26 DIAGNOSIS — N179 Acute kidney failure, unspecified: Secondary | ICD-10-CM | POA: Diagnosis present

## 2022-08-26 DIAGNOSIS — I161 Hypertensive emergency: Secondary | ICD-10-CM | POA: Diagnosis present

## 2022-08-26 DIAGNOSIS — H409 Unspecified glaucoma: Secondary | ICD-10-CM | POA: Diagnosis present

## 2022-08-26 DIAGNOSIS — R4020 Unspecified coma: Secondary | ICD-10-CM | POA: Diagnosis present

## 2022-08-26 DIAGNOSIS — I6389 Other cerebral infarction: Secondary | ICD-10-CM | POA: Diagnosis not present

## 2022-08-26 DIAGNOSIS — E785 Hyperlipidemia, unspecified: Secondary | ICD-10-CM | POA: Diagnosis present

## 2022-08-26 DIAGNOSIS — E870 Hyperosmolality and hypernatremia: Secondary | ICD-10-CM | POA: Diagnosis not present

## 2022-08-26 DIAGNOSIS — R34 Anuria and oliguria: Secondary | ICD-10-CM | POA: Diagnosis not present

## 2022-08-26 DIAGNOSIS — G253 Myoclonus: Secondary | ICD-10-CM | POA: Diagnosis not present

## 2022-08-26 DIAGNOSIS — G935 Compression of brain: Secondary | ICD-10-CM | POA: Diagnosis not present

## 2022-08-26 DIAGNOSIS — J9811 Atelectasis: Secondary | ICD-10-CM | POA: Diagnosis present

## 2022-08-26 DIAGNOSIS — J690 Pneumonitis due to inhalation of food and vomit: Secondary | ICD-10-CM | POA: Diagnosis present

## 2022-08-26 DIAGNOSIS — R569 Unspecified convulsions: Secondary | ICD-10-CM | POA: Diagnosis not present

## 2022-08-26 DIAGNOSIS — F1210 Cannabis abuse, uncomplicated: Secondary | ICD-10-CM | POA: Diagnosis present

## 2022-08-26 DIAGNOSIS — I618 Other nontraumatic intracerebral hemorrhage: Secondary | ICD-10-CM | POA: Diagnosis present

## 2022-08-26 DIAGNOSIS — T17500A Unspecified foreign body in bronchus causing asphyxiation, initial encounter: Secondary | ICD-10-CM | POA: Diagnosis not present

## 2022-08-26 DIAGNOSIS — F251 Schizoaffective disorder, depressive type: Secondary | ICD-10-CM | POA: Diagnosis present

## 2022-08-26 DIAGNOSIS — G8191 Hemiplegia, unspecified affecting right dominant side: Secondary | ICD-10-CM | POA: Diagnosis present

## 2022-08-26 DIAGNOSIS — T17490A Other foreign object in trachea causing asphyxiation, initial encounter: Secondary | ICD-10-CM | POA: Diagnosis not present

## 2022-08-26 DIAGNOSIS — I619 Nontraumatic intracerebral hemorrhage, unspecified: Secondary | ICD-10-CM | POA: Diagnosis present

## 2022-08-26 DIAGNOSIS — Z79899 Other long term (current) drug therapy: Secondary | ICD-10-CM

## 2022-08-26 DIAGNOSIS — E1165 Type 2 diabetes mellitus with hyperglycemia: Secondary | ICD-10-CM | POA: Diagnosis present

## 2022-08-26 DIAGNOSIS — R066 Hiccough: Secondary | ICD-10-CM | POA: Diagnosis not present

## 2022-08-26 DIAGNOSIS — E871 Hypo-osmolality and hyponatremia: Secondary | ICD-10-CM | POA: Diagnosis present

## 2022-08-26 DIAGNOSIS — G928 Other toxic encephalopathy: Secondary | ICD-10-CM | POA: Diagnosis present

## 2022-08-26 DIAGNOSIS — Z7984 Long term (current) use of oral hypoglycemic drugs: Secondary | ICD-10-CM

## 2022-08-26 DIAGNOSIS — I615 Nontraumatic intracerebral hemorrhage, intraventricular: Secondary | ICD-10-CM | POA: Diagnosis present

## 2022-08-26 DIAGNOSIS — K219 Gastro-esophageal reflux disease without esophagitis: Secondary | ICD-10-CM | POA: Diagnosis present

## 2022-08-26 DIAGNOSIS — R4189 Other symptoms and signs involving cognitive functions and awareness: Secondary | ICD-10-CM | POA: Diagnosis not present

## 2022-08-26 DIAGNOSIS — Z66 Do not resuscitate: Secondary | ICD-10-CM | POA: Diagnosis not present

## 2022-08-26 DIAGNOSIS — J988 Other specified respiratory disorders: Secondary | ICD-10-CM | POA: Diagnosis not present

## 2022-08-26 DIAGNOSIS — Z8673 Personal history of transient ischemic attack (TIA), and cerebral infarction without residual deficits: Secondary | ICD-10-CM

## 2022-08-26 DIAGNOSIS — R251 Tremor, unspecified: Secondary | ICD-10-CM | POA: Diagnosis not present

## 2022-08-26 DIAGNOSIS — I82621 Acute embolism and thrombosis of deep veins of right upper extremity: Secondary | ICD-10-CM | POA: Diagnosis not present

## 2022-08-26 DIAGNOSIS — G910 Communicating hydrocephalus: Secondary | ICD-10-CM

## 2022-08-26 DIAGNOSIS — I10 Essential (primary) hypertension: Secondary | ICD-10-CM | POA: Diagnosis present

## 2022-08-26 DIAGNOSIS — I610 Nontraumatic intracerebral hemorrhage in hemisphere, subcortical: Secondary | ICD-10-CM | POA: Diagnosis not present

## 2022-08-26 DIAGNOSIS — Z8249 Family history of ischemic heart disease and other diseases of the circulatory system: Secondary | ICD-10-CM

## 2022-08-26 DIAGNOSIS — R001 Bradycardia, unspecified: Secondary | ICD-10-CM | POA: Diagnosis not present

## 2022-08-26 DIAGNOSIS — R609 Edema, unspecified: Secondary | ICD-10-CM | POA: Diagnosis not present

## 2022-08-26 DIAGNOSIS — I7121 Aneurysm of the ascending aorta, without rupture: Secondary | ICD-10-CM | POA: Diagnosis present

## 2022-08-26 DIAGNOSIS — J9601 Acute respiratory failure with hypoxia: Secondary | ICD-10-CM | POA: Diagnosis present

## 2022-08-26 DIAGNOSIS — I959 Hypotension, unspecified: Secondary | ICD-10-CM | POA: Diagnosis not present

## 2022-08-26 DIAGNOSIS — T465X6A Underdosing of other antihypertensive drugs, initial encounter: Secondary | ICD-10-CM | POA: Diagnosis present

## 2022-08-26 DIAGNOSIS — G911 Obstructive hydrocephalus: Secondary | ICD-10-CM | POA: Diagnosis not present

## 2022-08-26 DIAGNOSIS — G936 Cerebral edema: Secondary | ICD-10-CM | POA: Diagnosis not present

## 2022-08-26 DIAGNOSIS — Z809 Family history of malignant neoplasm, unspecified: Secondary | ICD-10-CM

## 2022-08-26 LAB — CBC WITH DIFFERENTIAL/PLATELET
Abs Immature Granulocytes: 0.07 10*3/uL (ref 0.00–0.07)
Basophils Absolute: 0 10*3/uL (ref 0.0–0.1)
Basophils Relative: 0 %
Eosinophils Absolute: 0 10*3/uL (ref 0.0–0.5)
Eosinophils Relative: 0 %
HCT: 41.8 % (ref 39.0–52.0)
Hemoglobin: 12.7 g/dL — ABNORMAL LOW (ref 13.0–17.0)
Immature Granulocytes: 1 %
Lymphocytes Relative: 2 %
Lymphs Abs: 0.3 10*3/uL — ABNORMAL LOW (ref 0.7–4.0)
MCH: 27.3 pg (ref 26.0–34.0)
MCHC: 30.4 g/dL (ref 30.0–36.0)
MCV: 89.9 fL (ref 80.0–100.0)
Monocytes Absolute: 0.5 10*3/uL (ref 0.1–1.0)
Monocytes Relative: 4 %
Neutro Abs: 10.8 10*3/uL — ABNORMAL HIGH (ref 1.7–7.7)
Neutrophils Relative %: 93 %
Platelets: 195 10*3/uL (ref 150–400)
RBC: 4.65 MIL/uL (ref 4.22–5.81)
RDW: 18 % — ABNORMAL HIGH (ref 11.5–15.5)
WBC: 11.7 10*3/uL — ABNORMAL HIGH (ref 4.0–10.5)
nRBC: 0 % (ref 0.0–0.2)

## 2022-08-26 LAB — I-STAT ARTERIAL BLOOD GAS, ED
Acid-base deficit: 4 mmol/L — ABNORMAL HIGH (ref 0.0–2.0)
Bicarbonate: 21.2 mmol/L (ref 20.0–28.0)
Calcium, Ion: 1.21 mmol/L (ref 1.15–1.40)
HCT: 36 % — ABNORMAL LOW (ref 39.0–52.0)
Hemoglobin: 12.2 g/dL — ABNORMAL LOW (ref 13.0–17.0)
O2 Saturation: 100 %
Patient temperature: 98.5
Potassium: 3.6 mmol/L (ref 3.5–5.1)
Sodium: 141 mmol/L (ref 135–145)
TCO2: 22 mmol/L (ref 22–32)
pCO2 arterial: 37.4 mmHg (ref 32–48)
pH, Arterial: 7.362 (ref 7.35–7.45)
pO2, Arterial: 289 mmHg — ABNORMAL HIGH (ref 83–108)

## 2022-08-26 LAB — POCT I-STAT 7, (LYTES, BLD GAS, ICA,H+H)
Acid-base deficit: 5 mmol/L — ABNORMAL HIGH (ref 0.0–2.0)
Bicarbonate: 19.1 mmol/L — ABNORMAL LOW (ref 20.0–28.0)
Calcium, Ion: 1.25 mmol/L (ref 1.15–1.40)
HCT: 38 % — ABNORMAL LOW (ref 39.0–52.0)
Hemoglobin: 12.9 g/dL — ABNORMAL LOW (ref 13.0–17.0)
O2 Saturation: 97 %
Patient temperature: 98.8
Potassium: 3.7 mmol/L (ref 3.5–5.1)
Sodium: 148 mmol/L — ABNORMAL HIGH (ref 135–145)
TCO2: 20 mmol/L — ABNORMAL LOW (ref 22–32)
pCO2 arterial: 33.6 mmHg (ref 32–48)
pH, Arterial: 7.362 (ref 7.35–7.45)
pO2, Arterial: 96 mmHg (ref 83–108)

## 2022-08-26 LAB — COMPREHENSIVE METABOLIC PANEL
ALT: 18 U/L (ref 0–44)
AST: 24 U/L (ref 15–41)
Albumin: 3.9 g/dL (ref 3.5–5.0)
Alkaline Phosphatase: 52 U/L (ref 38–126)
Anion gap: 16 — ABNORMAL HIGH (ref 5–15)
BUN: 18 mg/dL (ref 8–23)
CO2: 18 mmol/L — ABNORMAL LOW (ref 22–32)
Calcium: 8.9 mg/dL (ref 8.9–10.3)
Chloride: 106 mmol/L (ref 98–111)
Creatinine, Ser: 1.28 mg/dL — ABNORMAL HIGH (ref 0.61–1.24)
GFR, Estimated: >60 mL/min (ref >60)
Glucose, Bld: 138 mg/dL — ABNORMAL HIGH (ref 70–99)
Potassium: 3.4 mmol/L — ABNORMAL LOW (ref 3.5–5.1)
Sodium: 140 mmol/L (ref 135–145)
Total Bilirubin: 0.8 mg/dL (ref 0.3–1.2)
Total Protein: 7 g/dL (ref 6.5–8.1)

## 2022-08-26 LAB — RAPID URINE DRUG SCREEN, HOSP PERFORMED
Amphetamines: NOT DETECTED
Barbiturates: NOT DETECTED
Benzodiazepines: POSITIVE — AB
Cocaine: NOT DETECTED
Opiates: NOT DETECTED
Tetrahydrocannabinol: POSITIVE — AB

## 2022-08-26 LAB — I-STAT CHEM 8, ED
BUN: 26 mg/dL — ABNORMAL HIGH (ref 8–23)
Calcium, Ion: 1.14 mmol/L — ABNORMAL LOW (ref 1.15–1.40)
Chloride: 107 mmol/L (ref 98–111)
Creatinine, Ser: 1.1 mg/dL (ref 0.61–1.24)
Glucose, Bld: 180 mg/dL — ABNORMAL HIGH (ref 70–99)
HCT: 44 % (ref 39.0–52.0)
Hemoglobin: 15 g/dL (ref 13.0–17.0)
Potassium: 5.1 mmol/L (ref 3.5–5.1)
Sodium: 143 mmol/L (ref 135–145)
TCO2: 27 mmol/L (ref 22–32)

## 2022-08-26 LAB — TROPONIN I (HIGH SENSITIVITY)
Troponin I (High Sensitivity): 27 ng/L — ABNORMAL HIGH (ref <18)
Troponin I (High Sensitivity): 29 ng/L — ABNORMAL HIGH (ref <18)

## 2022-08-26 LAB — MRSA NEXT GEN BY PCR, NASAL: MRSA by PCR Next Gen: NOT DETECTED

## 2022-08-26 LAB — CULTURE, RESPIRATORY W GRAM STAIN

## 2022-08-26 LAB — PROCALCITONIN: Procalcitonin: 0.18 ng/mL

## 2022-08-26 LAB — GLUCOSE, CAPILLARY
Glucose-Capillary: 85 mg/dL (ref 70–99)
Glucose-Capillary: 102 mg/dL — ABNORMAL HIGH (ref 70–99)
Glucose-Capillary: 125 mg/dL — ABNORMAL HIGH (ref 70–99)

## 2022-08-26 LAB — SODIUM
Sodium: 141 mmol/L (ref 135–145)
Sodium: 147 mmol/L — ABNORMAL HIGH (ref 135–145)

## 2022-08-26 LAB — LACTIC ACID, PLASMA
Lactic Acid, Venous: 4.3 mmol/L (ref 0.5–1.9)
Lactic Acid, Venous: 4.4 mmol/L (ref 0.5–1.9)

## 2022-08-26 LAB — HIV ANTIBODY (ROUTINE TESTING W REFLEX): HIV Screen 4th Generation wRfx: NONREACTIVE

## 2022-08-26 LAB — HEMOGLOBIN A1C
Hgb A1c MFr Bld: 5.6 % (ref 4.8–5.6)
Mean Plasma Glucose: 114.02 mg/dL

## 2022-08-26 MED ORDER — SODIUM CHLORIDE 0.9 % IV SOLN: 20.0000 mg/kg | Freq: Once | INTRAVENOUS | Status: DC

## 2022-08-26 MED ORDER — NOREPINEPHRINE 4 MG/250ML-% IV SOLN
2.0000 ug/min | INTRAVENOUS | Status: DC
  Administered 2022-08-26: 2 ug/min via INTRAVENOUS

## 2022-08-26 MED ORDER — LEVETIRACETAM IN NACL 1000 MG/100ML IV SOLN
1000.0000 mg | Freq: Once | INTRAVENOUS | Status: AC
  Administered 2022-08-26: 1000 mg via INTRAVENOUS

## 2022-08-26 MED ORDER — CLEVIDIPINE BUTYRATE 0.5 MG/ML IV EMUL
0.0000 mg/h | INTRAVENOUS | Status: DC
  Administered 2022-08-26: 1 mg/h via INTRAVENOUS
  Administered 2022-08-26: 2 mg/h via INTRAVENOUS

## 2022-08-26 MED ORDER — FENTANYL CITRATE PF 50 MCG/ML IJ SOSY
50.0000 ug | PREFILLED_SYRINGE | INTRAMUSCULAR | Status: DC | PRN
  Administered 2022-08-26: 50 ug via INTRAVENOUS
  Administered 2022-08-26: 100 ug via INTRAVENOUS
  Filled 2022-08-26: qty 4
  Filled 2022-08-26: qty 2

## 2022-08-26 MED ORDER — LEVETIRACETAM IN NACL 500 MG/100ML IV SOLN
500.0000 mg | Freq: Two times a day (BID) | INTRAVENOUS | Status: DC
  Administered 2022-08-26 – 2022-08-28 (×4): 500 mg via INTRAVENOUS
  Filled 2022-08-26 (×4): qty 100

## 2022-08-26 MED ORDER — LORAZEPAM 2 MG/ML IJ SOLN
INTRAMUSCULAR | Status: AC
  Filled 2022-08-26: qty 2

## 2022-08-26 MED ORDER — POLYETHYLENE GLYCOL 3350 17 G PO PACK
17.0000 g | PACK | Freq: Every day | ORAL | Status: DC
  Administered 2022-08-27 – 2022-08-30 (×4): 17 g
  Filled 2022-08-26 (×5): qty 1

## 2022-08-26 MED ORDER — MIDAZOLAM HCL 2 MG/2ML IJ SOLN
1.0000 mg | INTRAMUSCULAR | Status: DC | PRN
  Administered 2022-08-31 (×2): 2 mg via INTRAVENOUS
  Filled 2022-08-26 (×3): qty 2

## 2022-08-26 MED ORDER — PHENYLEPHRINE 80 MCG/ML (10ML) SYRINGE FOR IV PUSH (FOR BLOOD PRESSURE SUPPORT)
80.0000 ug | PREFILLED_SYRINGE | Freq: Once | INTRAVENOUS | Status: AC | PRN
  Administered 2022-08-26: 160 ug via INTRAVENOUS

## 2022-08-26 MED ORDER — ORAL CARE MOUTH RINSE
15.0000 mL | OROMUCOSAL | Status: DC
  Administered 2022-08-26 – 2022-09-05 (×117): 15 mL via OROMUCOSAL

## 2022-08-26 MED ORDER — PROPOFOL 1000 MG/100ML IV EMUL
INTRAVENOUS | Status: AC
  Filled 2022-08-26: qty 100

## 2022-08-26 MED ORDER — FENTANYL CITRATE PF 50 MCG/ML IJ SOSY
50.0000 ug | PREFILLED_SYRINGE | Freq: Once | INTRAMUSCULAR | Status: AC
  Administered 2022-08-26: 50 ug via INTRAVENOUS

## 2022-08-26 MED ORDER — DOCUSATE SODIUM 50 MG/5ML PO LIQD
100.0000 mg | Freq: Two times a day (BID) | ORAL | Status: DC
  Administered 2022-08-26 – 2022-08-29 (×6): 100 mg
  Filled 2022-08-26 (×6): qty 10

## 2022-08-26 MED ORDER — PANTOPRAZOLE SODIUM 40 MG IV SOLR
40.0000 mg | Freq: Every day | INTRAVENOUS | Status: DC
  Administered 2022-08-26 – 2022-09-04 (×10): 40 mg via INTRAVENOUS
  Filled 2022-08-26 (×10): qty 10

## 2022-08-26 MED ORDER — ORAL CARE MOUTH RINSE: 15.0000 mL | OROMUCOSAL | Status: DC | PRN

## 2022-08-26 MED ORDER — ETOMIDATE 2 MG/ML IV SOLN
10.0000 mg | Freq: Once | INTRAVENOUS | Status: AC
  Administered 2022-08-26: 10 mg via INTRAVENOUS

## 2022-08-26 MED ORDER — ACETAMINOPHEN 160 MG/5ML PO SOLN
650.0000 mg | ORAL | Status: DC | PRN
  Administered 2022-08-29 – 2022-09-05 (×15): 650 mg
  Filled 2022-08-26 (×15): qty 20.3

## 2022-08-26 MED ORDER — SODIUM CHLORIDE 0.9 % IV SOLN: 2000.0000 mg | Freq: Once | INTRAVENOUS | Status: DC

## 2022-08-26 MED ORDER — MIDAZOLAM HCL 2 MG/2ML IJ SOLN
4.0000 mg | Freq: Once | INTRAMUSCULAR | Status: AC
  Administered 2022-08-26: 4 mg via INTRAVENOUS

## 2022-08-26 MED ORDER — LEVETIRACETAM IN NACL 500 MG/100ML IV SOLN: 500.0000 mg | Freq: Two times a day (BID) | INTRAVENOUS | Status: DC

## 2022-08-26 MED ORDER — INSULIN ASPART 100 UNIT/ML IJ SOLN
0.0000 [IU] | INTRAMUSCULAR | Status: DC
  Administered 2022-08-26 – 2022-08-29 (×10): 1 [IU] via SUBCUTANEOUS
  Administered 2022-08-30: 2 [IU] via SUBCUTANEOUS
  Administered 2022-08-30 – 2022-08-31 (×5): 1 [IU] via SUBCUTANEOUS
  Administered 2022-08-31: 2 [IU] via SUBCUTANEOUS
  Administered 2022-09-01 (×3): 1 [IU] via SUBCUTANEOUS
  Administered 2022-09-01: 3 [IU] via SUBCUTANEOUS
  Administered 2022-09-02: 1 [IU] via SUBCUTANEOUS
  Administered 2022-09-02 (×3): 2 [IU] via SUBCUTANEOUS
  Administered 2022-09-02: 3 [IU] via SUBCUTANEOUS
  Administered 2022-09-02: 1 [IU] via SUBCUTANEOUS
  Administered 2022-09-02: 2 [IU] via SUBCUTANEOUS
  Administered 2022-09-03: 5 [IU] via SUBCUTANEOUS
  Administered 2022-09-03: 2 [IU] via SUBCUTANEOUS
  Administered 2022-09-03: 3 [IU] via SUBCUTANEOUS
  Administered 2022-09-03: 5 [IU] via SUBCUTANEOUS
  Administered 2022-09-03 (×2): 2 [IU] via SUBCUTANEOUS
  Administered 2022-09-04: 3 [IU] via SUBCUTANEOUS

## 2022-08-26 MED ORDER — CHLORHEXIDINE GLUCONATE CLOTH 2 % EX PADS
6.0000 | MEDICATED_PAD | Freq: Every day | CUTANEOUS | Status: DC
  Administered 2022-08-27 – 2022-09-04 (×10): 6 via TOPICAL

## 2022-08-26 MED ORDER — FENTANYL CITRATE PF 50 MCG/ML IJ SOSY: 50.0000 ug | PREFILLED_SYRINGE | INTRAMUSCULAR | Status: DC | PRN

## 2022-08-26 MED ORDER — PROPOFOL 1000 MG/100ML IV EMUL
5.0000 ug/kg/min | INTRAVENOUS | Status: DC
  Administered 2022-08-26: 30 ug/kg/min via INTRAVENOUS
  Administered 2022-08-26 – 2022-08-27 (×4): 40 ug/kg/min via INTRAVENOUS
  Administered 2022-08-27: 5 ug/kg/min via INTRAVENOUS
  Administered 2022-08-28: 30 ug/kg/min via INTRAVENOUS
  Administered 2022-08-28 – 2022-08-29 (×2): 15 ug/kg/min via INTRAVENOUS
  Administered 2022-08-29: 10 ug/kg/min via INTRAVENOUS
  Administered 2022-08-30 (×3): 15 ug/kg/min via INTRAVENOUS
  Administered 2022-08-31: 20 ug/kg/min via INTRAVENOUS
  Administered 2022-08-31 (×2): 40 ug/kg/min via INTRAVENOUS
  Administered 2022-08-31: 30 ug/kg/min via INTRAVENOUS
  Administered 2022-09-01 – 2022-09-02 (×8): 50 ug/kg/min via INTRAVENOUS
  Administered 2022-09-02: 25 ug/kg/min via INTRAVENOUS
  Administered 2022-09-02 (×3): 50 ug/kg/min via INTRAVENOUS
  Administered 2022-09-03 (×2): 25 ug/kg/min via INTRAVENOUS
  Administered 2022-09-03: 50 ug/kg/min via INTRAVENOUS
  Administered 2022-09-04 – 2022-09-05 (×5): 25 ug/kg/min via INTRAVENOUS
  Administered 2022-09-05: 30 ug/kg/min via INTRAVENOUS
  Filled 2022-08-26 (×41): qty 100

## 2022-08-26 MED ORDER — MIDAZOLAM HCL 2 MG/2ML IJ SOLN
INTRAMUSCULAR | Status: AC
  Filled 2022-08-26: qty 4

## 2022-08-26 MED ORDER — ETOMIDATE 2 MG/ML IV SOLN
20.0000 mg | Freq: Once | INTRAVENOUS | Status: AC
  Administered 2022-08-26: 20 mg via INTRAVENOUS

## 2022-08-26 MED ORDER — SODIUM CHLORIDE 3 % IV SOLN
INTRAVENOUS | Status: DC
  Filled 2022-08-26 (×8): qty 500

## 2022-08-26 MED ORDER — LABETALOL HCL 5 MG/ML IV SOLN
20.0000 mg | INTRAVENOUS | Status: DC | PRN
  Administered 2022-08-27 – 2022-08-30 (×2): 20 mg via INTRAVENOUS
  Filled 2022-08-26 (×2): qty 4

## 2022-08-26 MED ORDER — FENTANYL BOLUS VIA INFUSION
50.0000 ug | INTRAVENOUS | Status: DC | PRN
  Administered 2022-08-27 – 2022-08-31 (×3): 50 ug via INTRAVENOUS
  Administered 2022-08-31: 25 ug via INTRAVENOUS
  Administered 2022-08-31: 50 ug via INTRAVENOUS
  Administered 2022-08-31 (×2): 100 ug via INTRAVENOUS
  Administered 2022-08-31: 50 ug via INTRAVENOUS
  Administered 2022-08-31: 100 ug via INTRAVENOUS
  Administered 2022-09-03: 50 ug via INTRAVENOUS

## 2022-08-26 MED ORDER — STROKE: EARLY STAGES OF RECOVERY BOOK
Freq: Once | Status: AC
  Administered 2022-08-27: 1
  Filled 2022-08-26: qty 1

## 2022-08-26 MED ORDER — ACETAMINOPHEN 650 MG RE SUPP: 650.0000 mg | RECTAL | Status: DC | PRN

## 2022-08-26 MED ORDER — IOHEXOL 350 MG/ML SOLN
100.0000 mL | Freq: Once | INTRAVENOUS | Status: AC | PRN
  Administered 2022-08-26: 100 mL via INTRAVENOUS

## 2022-08-26 MED ORDER — SODIUM CHLORIDE 3 % IV BOLUS
250.0000 mL | Freq: Once | INTRAVENOUS | Status: AC
  Administered 2022-08-26: 250 mL via INTRAVENOUS
  Filled 2022-08-26: qty 500

## 2022-08-26 MED ORDER — PHENYLEPHRINE 80 MCG/ML (10ML) SYRINGE FOR IV PUSH (FOR BLOOD PRESSURE SUPPORT)
80.0000 ug | PREFILLED_SYRINGE | Freq: Once | INTRAVENOUS | Status: AC | PRN
  Administered 2022-08-26: 80 ug via INTRAVENOUS

## 2022-08-26 MED ORDER — SUCCINYLCHOLINE CHLORIDE 200 MG/10ML IV SOSY
140.0000 mg | PREFILLED_SYRINGE | Freq: Once | INTRAVENOUS | Status: AC
  Administered 2022-08-26: 140 mg via INTRAVENOUS

## 2022-08-26 MED ORDER — ACETAMINOPHEN 325 MG PO TABS
650.0000 mg | ORAL_TABLET | ORAL | Status: DC | PRN
  Administered 2022-08-26 – 2022-09-04 (×9): 650 mg
  Filled 2022-08-26 (×11): qty 2

## 2022-08-26 MED ORDER — CLEVIDIPINE BUTYRATE 0.5 MG/ML IV EMUL
0.0000 mg/h | INTRAVENOUS | Status: DC
  Administered 2022-08-26 (×2): 9 mg/h via INTRAVENOUS
  Administered 2022-08-27 (×2): 2 mg/h via INTRAVENOUS
  Filled 2022-08-26: qty 100
  Filled 2022-08-26: qty 50
  Filled 2022-08-26: qty 100
  Filled 2022-08-26: qty 50

## 2022-08-26 MED ORDER — FENTANYL 2500MCG IN NS 250ML (10MCG/ML) PREMIX INFUSION
50.0000 ug/h | INTRAVENOUS | Status: DC
  Administered 2022-08-26: 50 ug/h via INTRAVENOUS
  Administered 2022-08-28: 75 ug/h via INTRAVENOUS
  Administered 2022-08-29: 100 ug/h via INTRAVENOUS
  Administered 2022-08-30: 50 ug/h via INTRAVENOUS
  Administered 2022-08-31 – 2022-09-01 (×2): 75 ug/h via INTRAVENOUS
  Administered 2022-09-02: 50 ug/h via INTRAVENOUS
  Administered 2022-09-03: 75 ug/h via INTRAVENOUS
  Administered 2022-09-04 – 2022-09-05 (×2): 100 ug/h via INTRAVENOUS
  Filled 2022-08-26 (×10): qty 250

## 2022-08-26 NOTE — ED Triage Notes (Signed)
7.5 fr et tube placed 25 at the teeth, bilateral breath sounds, color change and chest rise noted.

## 2022-08-26 NOTE — Progress Notes (Signed)
SLP Cancellation Note  Patient Details Name: Jimmy Marshall MRN: [id number] DOB: December 05, 1955   Cancelled treatment:       Reason Eval/Treat Not Completed: Patient not medically ready/ Patient remains on vent. SLP will follow for readiness.   Angela Nevin, MA, CCC-SLP Speech Therapy

## 2022-08-26 NOTE — ED Triage Notes (Signed)
20mg  of etomidate give at 1304 and 140mg  of succinylcholine given at 1304

## 2022-08-26 NOTE — Progress Notes (Signed)
LTM EEG hooked up and running - no initial skin breakdown - push button tested - [facility] monitoring. Impedance hard to keep under 10ohms. Did not want to over prep electrode sites.

## 2022-08-26 NOTE — Progress Notes (Signed)
Patient transported on vent to CT and returned to [facility] without complications.

## 2022-08-26 NOTE — Procedures (Signed)
Risks and benefits were discussed with the patient and family over the phone. Consent obtained. Initial insertion site was marked midpupilary line just behind the hairline on the right. Patient was prepped and draped in sterile fashion. Patient was on a propofol gtt. Vital signs stable after administration of propofol and patient resting comfortably. 3ml of Lidocaine was used for local anesthetic. Incision with a scalpel was made at the insertion point that was already determined. Hand drill was then used to make a small craniotomy. Dura was felt and then punctured with a needle. Catheter was inserted and advanced until CSF return was seen. Advanced the catheter to 6mm. CSF flow still present. Catheter was then tunneled through a separate insertion site and sutured down securely with nylon suture. Initial incision was suture closed with nylon as well. CSF flow through catheter still patent. Catheter was connected to external drainage system and placed at 10mm of H2O. Sterile dressing applied. Patient tolerated the procedure well and vital signs were stable throughout.

## 2022-08-26 NOTE — H&P (Signed)
NAME:  Jimmy Marshall, MRN:  [id number], DOB:  1955/04/15, LOS: 0 ADMISSION DATE:  08/26/2022 CONSULTATION DATE:  08/26/2022 REFERRING MD:  Kommor - EDP CHIEF COMPLAINT:  AMS, ICH   History of Present Illness:  67 year old man who presented to [facility] ED 5/22 after being found down at a hotel.  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  Patient is intubated and sedated, therefore history is obtained from chart review.  Per EMS, patient is currently residing in a hotel and was found down by staff who went to collect rent from patient.  Found unresponsive, RR 4 and Narcan 1mg  (IN) and 3mg  IV given with no response.  Intubated on arrival to ED. CXR notable for left greater than right atelectasis.  Per EDP, BP very labile; hypertensive on admission then dropping BP to SBP 70s requiring Neo pushes.  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  Neurosurgery was consulted.  CTA Chest/A/P negative for aortic dissection, overall unchanged enlargement of distal aortic arch/ascending thoracic aorta.  PCCM consulted for ICU admission.  Pertinent Medical History:   Past Medical History:  Diagnosis Date   GERD (gastroesophageal reflux disease)    Glaucoma    Hypertension    Schizoaffective disorder, depressive type (HCC)    "dx'd in the 1990's"   Schizophrenia, schizo-affective (HCC)    Significant Hospital Events: Including procedures, antibiotic start and stop dates in addition to other pertinent events   5/22 -presented via EMS after being found down at hotel.  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 Neuro/Stroke consulted. Neurosurgery consulted. ?EVD placement.  Interim History / Subjective:  PCCM consulted for ICU admission  Objective:  Blood  pressure 101/62, pulse 95, temperature 98.5 F (36.9 C), temperature source Axillary, resp. rate (!) 26, height 5\' 11"  (1.803 m), weight 95.3 kg, SpO2 100 %.       No intake or output data in the 24 hours ending 08/26/22 1411 Filed Weights   08/26/22 1305  Weight: 95.3 kg   Physical Examination: General: Acutely ill-appearing middle-aged man in NAD. Intubated, sedated. HEENT: [place]/AT, anicteric sclera, pupils equal round 2mm, nonreactive, moist mucous membranes. ETT in place. Neuro: Sedated. Shivers/shakes with noxious stimuli, flexes/rolls BLE to pain, mild flexion to pain in LUE, minimal response in RUE. Not following commands. +Corneal, +Cough, and +Gag  CV: RRR, no m/g/r. PULM: Breathing even and unlabored on vent (PEEP 5, FiO2 40%). Lung fields CTAB in upper fields, diminished at bases. GI: Soft, nontender, nondistended. Normoactive bowel sounds. Extremities: Trace bilateral symmetric LE edema noted. Skin: Warm/dry, no rashes.  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 - Admit to ICU for close monitoring - NSGY consulted for ?EVD - Neuro/Stroke following - Goal SBP 130-150 - Cleviprex titrated to goal SBP - S/p Keppra load, AEDs per Neuro - HTS 3% per Neuro - LTM EEG - Frequent neuro checks - Neuroprotective measures: HOB > 30 degrees, normoglycemia, normothermia, electrolytes WNL - PT/OT/SLP when able to participate in care  Altered mental status with acute encephalopathy, likely related to IPH + toxic metabolic - Neuro workup as above - F/u UDS, LA - Correct metabolic derangements as  able - PAD protocol  Acute hypoxemic respiratory failure Possible aspiration event - Continue full vent support (4-8cc/kg IBW) - Wean FiO2 for O2 sat  > 90% - Daily WUA/SBT - VAP bundle - Pulmonary hygiene - PAD protocol for sedation: Propofol and Fentanyl for goal RASS 0 to -1 - Follow CXR, ABG - Resp Cx  Thoracic aortic intramural hematoma and descending thoracic aortic aneurysm HTN Noted on scan 08/2021 4.6 cm. - BP control as above with Cleviprex - Goal 130-150 with IPH - Resume home medications as appropriate  Schizoaffective disorder/schizophrenia - Resume home Zyprexa, Remeron as clinically appropriate  GERD - PPI  At risk for hyperglycemia - Hold home metformin - SSI - CBGs Q4H - Goal CBG 140-180  Best Practice: (right click and "Reselect all SmartList Selections" daily)   Diet/type: NPO DVT prophylaxis: SCDs GI prophylaxis: PPI Lines: Arterial Line Foley:  Yes, and it is still needed Code Status:  full code Last date of multidisciplinary goals of care discussion [5/22 - Per patient's brother, Harvie Heck, to his knowledge patient would want all interventions.]  Labs:  CBC: Recent Labs  Lab 08/26/22 1317 08/26/22 1408  HGB 15.0 12.2*  HCT 44.0 36.0*   Basic Metabolic Panel: Recent Labs  Lab 08/26/22 1317 08/26/22 1408  NA 143 141  K 5.1 3.6  CL 107  --   GLUCOSE 180*  --   BUN 26*  --   CREATININE 1.10  --    GFR: Estimated Creatinine Clearance: 79.9 mL/min (by C-G formula based on SCr of 1.1 mg/dL). No results for input(s): "PROCALCITON", "WBC", "LATICACIDVEN" in the last 168 hours.  Liver Function Tests: No results for input(s): "AST", "ALT", "ALKPHOS", "BILITOT", "PROT", "ALBUMIN" in the last 168 hours. No results for input(s): "LIPASE", "AMYLASE" in the last 168 hours. No results for input(s): "AMMONIA" in the last 168 hours.  ABG:    Component Value Date/Time   PHART 7.362 08/26/2022 1408   PCO2ART 37.4 08/26/2022 1408   PO2ART 289 (H) 08/26/2022 1408   HCO3 21.2 08/26/2022 1408   TCO2 22 08/26/2022 1408   ACIDBASEDEF 4.0 (H) 08/26/2022 1408   O2SAT 100 08/26/2022 1408     Coagulation Profile: No results for input(s): "INR", "PROTIME" in the last 168 hours.  Cardiac Enzymes: No results for input(s): "CKTOTAL", "CKMB", "CKMBINDEX", "TROPONINI" in the last 168 hours.  HbA1C: Hgb A1c MFr Bld  Date/Time Value Ref Range Status  05/13/2021 12:57 AM 5.3 4.8 - 5.6 % Final    Comment:    (NOTE) Pre diabetes:          5.7%-6.4%  Diabetes:              >6.4%  Glycemic control for   <7.0% adults with diabetes    CBG: No results for input(s): "GLUCAP" in the last 168 hours.  Review of Systems:   Patient is encephalopathic and/or intubated; therefore, history has been obtained from chart review.   Past Medical History:  He,  has a past medical history of GERD (gastroesophageal reflux disease), Glaucoma, Hypertension, Schizoaffective disorder, depressive type (HCC), and Schizophrenia, schizo-affective (HCC).   Surgical History:   Past Surgical History:  Procedure Laterality Date   FRACTURE SURGERY  1996   jaw    INCISION AND DRAINAGE / EXCISION THYROGLOSSAL CYST  06/30/11   w/central hyoid resection   THYROGLOSSAL DUCT CYST  06/30/2011   Procedure: THYROGLOSSAL DUCT CYST;  Surgeon: Osborn Coho, MD;  Location: [facility] OR;  Service: ENT;  Laterality:  N/A;  excision of thyroglossal duct cyst   Social History:   reports that he has never smoked. He has never used smokeless tobacco. He reports current alcohol use. He reports current drug use. Drugs: Marijuana and Cocaine.   Family History:  His family history includes Cancer in his father; Heart attack in his mother; Hypertension in his mother.   Allergies: No Known Allergies   Home Medications: Prior to Admission medications   Medication Sig Start Date End Date Taking? Authorizing Provider  diltiazem (CARTIA XT) 240 MG 24 hr capsule Take 1 capsule (240 mg total) by mouth every morning. 08/10/22 09/09/22  Tolia, Sunit, DO  losartan (COZAAR) 100 MG tablet Take 1 tablet (100 mg total) by mouth daily at 10 pm.  08/10/22 09/09/22  Tolia, Sunit, DO  metFORMIN (GLUCOPHAGE) 500 MG tablet Take 500 mg by mouth 2 (two) times daily with a meal.    [provider]  mirtazapine (REMERON) 15 MG tablet Take 15 mg by mouth at bedtime. 02/27/20   [provider]  OLANZapine (ZYPREXA) 10 MG tablet Take 10 mg by mouth at bedtime.    [provider]  OLANZapine (ZYPREXA) 20 MG tablet Take 20 mg by mouth at bedtime.    [provider]  spironolactone (ALDACTONE) 25 MG tablet Take 1 tablet (25 mg total) by mouth every morning. 08/10/22 09/09/22  Tessa Lerner, DO   Critical care time:    The patient is critically ill with multiple organ system failure and requires high complexity decision making for assessment and support, frequent evaluation and titration of therapies, advanced monitoring, review of radiographic studies and interpretation of complex data.   Critical Care Time devoted to patient care services, exclusive of separately billable procedures, described in this note is 41 minutes.  Tim Lair, PA-C [facility] 08/26/22 2:11 PM  Please see [email or web address] for pager details.  From 7A-7P if no response, please call [phone] After hours, please call ELink [phone]

## 2022-08-26 NOTE — ED Notes (Signed)
Dr.Kommor placed an A line to right wrist at this time.

## 2022-08-26 NOTE — ED Notes (Signed)
20mg  of propofol

## 2022-08-26 NOTE — Consult Note (Addendum)
Neurology Consultation  Reason for Consult: Bleed on CT scan Referring Physician: Kommor  CC: Unresponsive  History is obtained from: EMS, Chart  HPI: Jimmy Marshall is a 67 y.o. 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 He was found unresponsive by staff at the hotel he lives at when they went to collect rent from him. EMS was called, he was given narcan. Intubated on arrival to ED, hypotensive. CT Head done and shows large left thalamic IPH with IVH and hydrocephalus. Neurosurgery consulted as well.    LKW: Unknown   ROS: Unable to obtain due to altered mental status.   Past Medical History:  Diagnosis Date   GERD (gastroesophageal reflux disease)    Glaucoma    Hypertension    Schizoaffective disorder, depressive type (HCC)    "dx'd in the 1990's"   Schizophrenia, schizo-affective (HCC)     Family History  Problem Relation Age of Onset   Hypertension Mother    Heart attack Mother    Cancer Father     Social History:   reports that he has never smoked. He has never used smokeless tobacco. He reports current alcohol use. He reports current drug use. Drugs: Marijuana and Cocaine.  Medications  Current Facility-Administered Medications:    midazolam (VERSED) injection 1-2 mg, 1-2 mg, Intravenous, Q1H PRN, Kommor, Madison, MD   norepinephrine (LEVOPHED) 4mg  in (0.016 mg/mL) premix infusion, 2-10 mcg/min, Intravenous, Titrated, Kommor, Madison, MD, Last Rate: 18.75 mL/hr at 08/26/22 1406, 5 mcg/min at 08/26/22 1406   propofol (DIPRIVAN) 1000 MG/100ML infusion, 5-80 mcg/kg/min, Intravenous, Continuous, Kommor, Madison, MD, Last Rate: 17.15 mL/hr at 08/26/22 1338, 30 mcg/kg/min at 08/26/22 1338  Current Outpatient Medications:    diltiazem (CARTIA XT) 240 MG 24 hr capsule, Take  1 capsule (240 mg total) by mouth every morning., Disp: 30 capsule, Rfl: 0   losartan (COZAAR) 100 MG tablet, Take 1 tablet (100 mg total) by mouth daily at 10 pm., Disp: 30 tablet, Rfl: 0   metFORMIN (GLUCOPHAGE) 500 MG tablet, Take 500 mg by mouth 2 (two) times daily with a meal., Disp: , Rfl:    mirtazapine (REMERON) 15 MG tablet, Take 15 mg by mouth at bedtime., Disp: , Rfl:    OLANZapine (ZYPREXA) 10 MG tablet, Take 10 mg by mouth at bedtime., Disp: , Rfl:    OLANZapine (ZYPREXA) 20 MG tablet, Take 20 mg by mouth at bedtime., Disp: , Rfl:    spironolactone (ALDACTONE) 25 MG tablet, Take 1 tablet (25 mg total) by mouth every morning., Disp: 30 tablet, Rfl: 0  Exam: Current vital signs: BP 101/62   Pulse 95   Temp 98.5 F (36.9 C) (Axillary)   Resp (!) 26   Ht 5\' 11"  (1.803 m)   Wt 95.3 kg   SpO2 100%   BMI 29.29 kg/m  Vital signs in last 24 hours: Temp:  [98.5 F (36.9 C)] 98.5 F (36.9 C) (05/22 1301) Pulse Rate:  [90-114] 95 (05/22 1400) Resp:  [19-32] 26 (05/22 1400) BP: (75-144)/(62-124) 101/62 (05/22 1400) SpO2:  [96 %-100 %] 100 % (05/22 1400) Weight:  [95.3 kg] 95.3 kg (05/22 1305)  GENERAL: Unresponsive, intubated HEENT: - Normocephalic and atraumatic, dry mm, no LN++, no Thyromegally LUNGS - Mechanically ventilated CV - S1S2 RRR, no m/r/g, equal pulses bilaterally. ABDOMEN - Soft, nontender, nondistended with normoactive BS Ext: warm, well perfused,  intact peripheral pulses, no edema  NEURO:  Mental Status: Intubated, sedated with propofol Cranial Nerves: Pupils 2mm, unreactive, eyes midline. Dolls eyes intact, corneal reflex intact bilaterally. Cough and gag intact. Does not blink to threat. Head is midline.  Motor/sensory: Flexion to painful stimuli in bilateral upper extremities and internal rotation in bilateral lower extremities. Shivering noted with stimulation Coordination: Unable to complete Gait- deferred  ICH score = 2   Labs I have reviewed labs  in epic and the results pertinent to this consultation are:  CBC    Component Value Date/Time   WBC 11.7 (H) 05/16/2021 0357   RBC 4.13 (L) 05/16/2021 0357   HGB 12.2 (L) 08/26/2022 1408   HCT 36.0 (L) 08/26/2022 1408   PLT 266 05/16/2021 0357   MCV 85.2 05/16/2021 0357   MCH 27.4 05/16/2021 0357   MCHC 32.1 05/16/2021 0357   RDW 17.4 (H) 05/16/2021 0357   LYMPHSABS 1.8 05/13/2021 0548   MONOABS 1.0 05/13/2021 0548   EOSABS 0.0 05/13/2021 0548   BASOSABS 0.0 05/13/2021 0548    CMP     Component Value Date/Time   NA 141 08/26/2022 1408   K 3.6 08/26/2022 1408   CL 107 08/26/2022 1317   CO2 24 05/16/2021 0357   GLUCOSE 180 (H) 08/26/2022 1317   BUN 26 (H) 08/26/2022 1317   CREATININE 1.10 08/26/2022 1317   CALCIUM 8.6 (L) 05/16/2021 0357   PROT 5.9 (L) 05/16/2021 0357   ALBUMIN 3.1 (L) 05/16/2021 0357   AST 21 05/16/2021 0357   ALT 39 05/16/2021 0357   ALKPHOS 55 05/16/2021 0357   BILITOT 0.8 05/16/2021 0357   GFRNONAA >60 05/16/2021 0357   GFRAA 74 (L) 07/07/2012 0525    Lipid Panel  No results found for: "CHOL", "TRIG", "HDL", "CHOLHDL", "VLDL", "LDLCALC", "LDLDIRECT"   Imaging I have reviewed the images obtained: CT-head - Acute parenchymal hematoma centered in the left thalamus (approximately 7 cc) with intraventricular extension and associated communicating hydrocephalus.  Assessment:  67 y.o. 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 He was found unresponsive by staff who went to collect rent from the patient. Intubated on arrival to ED, hypotensive. CT Head done and shows large left thalamic IPH with IVH and hydrocephalus. NSGY consulted for EVD placement and they are reaching out to family.   He had intermittent nonrhythmic shaking spells involving LUE only that did not appear  particularly epileptiform. However he is at high risk for sz 2/2 the hemorrhage and we started him on keppra. Plan for cEEG overnight.  Impression: Acute IPH in the left thalamus with IVH and hydrocephalus  Recommendations: - NGSY consult  - MRI brain w/ and w/o when stabilized to eval for underlying mass  - MRA of the brain without contrast to eval for underlying vascular lesion  - Frequent neuro checks, q68min x 1 hr, then q1hr - Stability scan in 6 hours or STAT if neurological decline - Goal SBP <150, use clevidipine prn for BP outside parameters - Keppra 20mg /kg load f/b 500mg  bid. Can likely be weaned after the acute post-bleed period if no definite sz - LTM EEG overnight, can unhook tomorrow if no seizures - HOB 30 deg - Echocardiogram - No antiplatelets due to ICH - DVT PPx at 24 hrs if stable, SCDs for now - Risk factor modification - Telemetry monitoring; 30 day event monitor on discharge if no  arrythmias captured  - Blood pressure goal SBP < 150, cleverplex and labetalol PRN  - PT consult, OT consult, Speech consult when patient stabilized  - Stroke team to follow  Patient seen and examined by NP/APP with MD. MD to update note as needed.   Elmer Picker, DNP, FNP-BC [facility] Pager: [phone]   Attending Neurohospitalist Addendum Patient seen and examined with APP/Resident. Agree with the history and physical as documented above. Agree with the plan as documented, which I helped formulate. I have edited the note above to reflect my full findings and recommendations. I have independently reviewed the chart, obtained history, review of systems and examined the patient.I have personally reviewed pertinent head/neck/spine imaging (CT/MRI). Please feel free to call with any questions.  This patient is critically ill and at significant risk of neurological worsening, death and care requires constant monitoring of vital signs, hemodynamics,respiratory and  cardiac monitoring, neurological assessment, discussion with family, other specialists and medical decision making of high complexity. I spent 70 minutes of neurocritical care time  in the care of  this patient. This was time spent independent of any time provided by nurse practitioner or PA.  Bing Neighbors, MD [facility] [phone]  If 7pm- 7am, please page neurology on call as listed in AMION.

## 2022-08-26 NOTE — Progress Notes (Signed)
Patient transported on vent to 4N17 without complications.

## 2022-08-26 NOTE — Progress Notes (Signed)
LTM maint complete - no skin breakdown seen.  Several leads re pasted and reapplied:  F4 is not being used due to drain. [facility] monitored, Event button test confirmed by [facility].

## 2022-08-26 NOTE — ED Triage Notes (Signed)
Pt to er via ems, per ems pt is living in a hotel and when they staff went to collect rent pt was found unresponsive, ems states that they found pt with a RR of 4, states that pt was given 1 mg of narcan nasal and 3mg  iv, states that RR improved but pt is still not responsive.  Pt in bed, RT and MD at bedside, preparing for intubation.

## 2022-08-26 NOTE — ED Provider Notes (Signed)
[place] EMERGENCY DEPARTMENT AT [facility] Provider Note  CSN: [id number] Arrival date & time: 08/26/22 1257  Chief Complaint(s) Loss of Consciousness  HPI Jimmy Marshall is a 67 y.o. 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  History obtained from EMS who states that they received a call from the hotel where the patient was found down with pinpoint pupils and agonal respirations.  Patient was given 1 mg intranasal Narcan and 3 mg of IV Narcan with minimal symptomatic improvement in the field.  He arrives on a nonrebreather saturating 100% but with deep gurgling agonal respirations.  Additional history unable to be obtained as the patient is unresponsive.   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 Authorizing Provider  diltiazem (CARTIA XT) 240 MG 24 hr capsule Take 1 capsule (240 mg total) by mouth every morning. 08/10/22 09/09/22  Tolia, Sunit, DO  losartan (COZAAR) 100 MG tablet Take 1 tablet (100 mg total) by mouth daily at 10 pm. 08/10/22 09/09/22  Tolia, Sunit, DO  metFORMIN (GLUCOPHAGE) 500 MG tablet Take 500 mg by mouth 2 (two) times daily with a meal.    [provider]  mirtazapine (REMERON) 15 MG tablet Take 15 mg by mouth at bedtime. 02/27/20    [provider]  OLANZapine (ZYPREXA) 10 MG tablet Take 10 mg by mouth at bedtime.    [provider]  OLANZapine (ZYPREXA) 20 MG tablet Take 20 mg by mouth at bedtime.    [provider]  spironolactone (ALDACTONE) 25 MG tablet Take 1 tablet (25 mg total) by mouth every morning. 08/10/22 09/09/22  Tessa Lerner, DO                                                                                                                                    Past Surgical History Past Surgical History:  Procedure Laterality Date   FRACTURE SURGERY  1996   jaw    INCISION AND DRAINAGE / EXCISION THYROGLOSSAL CYST  06/30/11   w/central hyoid resection   THYROGLOSSAL DUCT CYST  06/30/2011   Procedure: THYROGLOSSAL DUCT CYST;  Surgeon: Osborn Coho, MD;  Location: [facility] OR;  Service: ENT;  Laterality: N/A;  excision of thyroglossal duct cyst   Family History Family History  Problem  Relation Age of Onset   Hypertension Mother    Heart attack Mother    Cancer Father     Social History Social History   Tobacco Use   Smoking status: Never   Smokeless tobacco: Never  Vaping Use   Vaping Use: Never used  Substance Use Topics   Alcohol use: Yes    Comment: 1 beer a month   Drug use: Yes    Types: Marijuana, Cocaine    Comment: "last marijuana 1990's; last  recreational cocaine 1999"   Allergies Patient has no known allergies.  Review of Systems Review of Systems  Unable to perform ROS: Patient unresponsive    Physical Exam Vital Signs  I have reviewed the triage vital signs BP 118/81   Pulse (!) 109   Temp 98.5 F (36.9 C) (Axillary)   Resp (!) 26   Ht 5\' 11"  (1.803 m)   Wt 95.3 kg   SpO2 97%   BMI 29.29 kg/m   Physical Exam Vitals and nursing note reviewed.  Constitutional:      General: He is in acute distress.     Appearance: He is well-developed. He is toxic-appearing and diaphoretic.  HENT:     Head: Normocephalic and atraumatic.  Eyes:      Comments: Pinpoint pupils  Cardiovascular:     Heart sounds: No murmur heard. Pulmonary:     Effort: Respiratory distress present.     Comments: Agonal respirations Abdominal:     Palpations: Abdomen is soft.     Tenderness: There is no abdominal tenderness.  Musculoskeletal:        General: No swelling.     Cervical back: Neck supple.  Skin:    Capillary Refill: Capillary refill takes more than 3 seconds.     Coloration: Skin is pale.  Neurological:     Mental Status: He is alert.     ED Results and Treatments Labs (all labs ordered are listed, but only abnormal results are displayed) Labs Reviewed  I-STAT CHEM 8, ED - Abnormal; Notable for the following components:      Result Value   BUN 26 (*)    Glucose, Bld 180 (*)    Calcium, Ion 1.14 (*)    All other components within normal limits  CULTURE, BLOOD (ROUTINE X 2)  CULTURE, BLOOD (ROUTINE X 2)  RAPID URINE DRUG SCREEN, HOSP PERFORMED  COMPREHENSIVE METABOLIC PANEL  CBC WITH DIFFERENTIAL/PLATELET  LACTIC ACID, PLASMA  LACTIC ACID, PLASMA  I-STAT ARTERIAL BLOOD GAS, ED  TROPONIN I (HIGH SENSITIVITY)                                                                                                                          Radiology No results found.  Pertinent labs & imaging results that were available during my care of the patient were reviewed by me and considered in my medical decision making (see MDM for details).  Medications Ordered in ED Medications  etomidate (  AMIDATE) injection 20 mg (has no administration in time range)  succinylcholine (ANECTINE) syringe 140 mg (has no administration in time range)  etomidate (AMIDATE) injection 10 mg (has no administration in time range)  propofol (DIPRIVAN) 1000 MG/100ML infusion (has no administration in time range)  PHENYLephrine 80 mcg/ml in normal saline Adult IV Push Syringe (For Blood Pressure Support) (has no administration in time range)                                                                                                                                      Procedures .Critical Care  Performed by: Glendora Score, MD Authorized by: Glendora Score, MD   Critical care provider statement:    Critical care time (minutes):  75   Critical care was necessary to treat or prevent imminent or life-threatening deterioration of the following conditions:  Shock, CNS failure or compromise and respiratory failure   Critical care was time spent personally by me on the following activities:  Development of treatment plan with patient or surrogate, discussions with consultants, evaluation of patient's response to treatment, examination of patient, ordering and review of laboratory studies, ordering and review of radiographic studies, ordering and performing treatments and interventions, pulse oximetry, re-evaluation of patient's condition and review of old charts Procedure Name: Intubation Date/Time: 08/26/2022 5:39 PM  Performed by: Glendora Score, MDPre-anesthesia Checklist: Patient identified, Patient being monitored, Emergency Drugs available, Timeout performed and Suction available Oxygen Delivery Method: Non-rebreather mask Preoxygenation: Pre-oxygenation with 100% oxygen Induction Type: Rapid sequence Ventilation: Mask ventilation without difficulty Laryngoscope Size: Glidescope and 3 Grade View: Grade I Tube size: 7.5 mm Number of attempts: 1 Airway Equipment and Method: Video-laryngoscopy Placement Confirmation: ETT inserted through vocal cords under direct vision, CO2 detector and Breath sounds checked- equal and bilateral Secured at: 25 cm Tube secured with: ETT holder    ARTERIAL LINE  Date/Time: 08/26/2022 5:46 PM  Performed by: Glendora Score, MD Authorized by: Glendora Score, MD   Consent:    Consent obtained:  Emergent situation Indications:    Indications: hemodynamic monitoring and multiple ABGs   Pre-procedure details:     Skin preparation:  Chlorhexidine Sedation:    Sedation type:  None Anesthesia:    Anesthesia method:  None Procedure details:    Location:  R radial   Placement technique:  Seldinger   Number of attempts:  1 Post-procedure details:    Post-procedure:  Sterile dressing applied   CMS:  Normal   Procedure completion:  Tolerated well, no immediate complications   (including critical care time)  Medical Decision Making / ED Course   This patient presents to the ED for concern of loss of consciousness, this involves an extensive number of treatment options, and is a complaint that carries with it a high risk of complications and morbidity.  The differential diagnosis includes intracranial hemorrhage, aortic dissection, sepsis, MI, intra-abdominal perforation, electrolyte abnormality, arrhythmia, overdose  MDM: Patient seen emergency room for evaluation of loss of consciousness.  Physical exam reveals a toxic appearing patient with agonal respirations, nonresponsive to commands and pinpoint pupils.  Patient intubated immediately on arrival for airway protection with etomidate and succinylcholine.  Initial chest x-ray showing endotracheal tube in appropriate position.  Laboratory evaluation with leukocytosis to 11.7, hemoglobin 12.7, initial pH 7.36 with no significant hypercarbia.  Patient had significant sedation requirements requiring multiple boluses of propofol with a propofol drip as well as boluses of Versed.  Unable to bolus fentanyl given recent Narcan use.  This led to some postintubation hypotension that required multiple pushes of Neo-Synephrine and ultimate initiation of Levophed.  Right radial A-line placed.  CT head is highly concerning with an acute parenchymal hematoma in the left thalamus with intraventricular extension and communicating hydrocephalus.  CT dissection study with no evidence of aortic dissection, unchanged enlargement of the distal aortic arch, but this scan shows  the endotracheal tube at the a right mainstem bronchus.  The endotracheal tube was then backed out 2 cm.  I spoke with Dr. Marikay Alar of neurosurgery who is recommending speaking with neurology and ICU admission and possible consideration of an intraventricular drain.  I spoke with the ICU team who agrees to admit the patient.  Patient started on 3% normal saline and admitted.   Additional history obtained:  -External records from outside source obtained and reviewed including: Chart review including previous notes, labs, imaging, consultation notes   Lab Tests: -I ordered, reviewed, and interpreted labs.   The pertinent results include:   Labs Reviewed  I-STAT CHEM 8, ED - Abnormal; Notable for the following components:      Result Value   BUN 26 (*)    Glucose, Bld 180 (*)    Calcium, Ion 1.14 (*)    All other components within normal limits  CULTURE, BLOOD (ROUTINE X 2)  CULTURE, BLOOD (ROUTINE X 2)  RAPID URINE DRUG SCREEN, HOSP PERFORMED  COMPREHENSIVE METABOLIC PANEL  CBC WITH DIFFERENTIAL/PLATELET  LACTIC ACID, PLASMA  LACTIC ACID, PLASMA  I-STAT ARTERIAL BLOOD GAS, ED  TROPONIN I (HIGH SENSITIVITY)      EKG   EKG Interpretation  Date/Time:  Wednesday Aug 26 2022 13:02:03 EDT Ventricular Rate:  88 PR Interval:  177 QRS Duration: 97 QT Interval:  380 QTC Calculation: 468 R Axis:   -7 Text Interpretation: Sinus rhythm Ventricular premature complex Probable left atrial enlargement Confirmed by Mckoy Bhakta ([id number]) on 08/26/2022 5:47:01 PM         Imaging Studies ordered: I ordered imaging studies including chest x-ray, KUB, CT head, CT dissection study I independently visualized and interpreted imaging. I agree with the radiologist interpretation   Medicines ordered and prescription drug management: Meds ordered this encounter  Medications   etomidate (AMIDATE) injection 20 mg   succinylcholine (ANECTINE) syringe 140 mg   etomidate (AMIDATE) injection 10  mg   propofol (DIPRIVAN) 1000 MG/100ML infusion   PHENYLephrine 80 mcg/ml in normal saline Adult IV Push Syringe (For Blood Pressure Support)    -I have reviewed the patients home medicines and have made adjustments as needed  Critical interventions Intubation, 3% saline, neurosurgical consultation, Levophed, A-line placement  Consultations Obtained: I requested consultation with the neurosurgeon on-call Dr. Yetta Barre, neurologist on-call Dr. Selina Cooley, ICU team,  and discussed lab and imaging findings as well as pertinent plan - they recommend: ICU admit, blood pressure control, possible intraventricular drain placement   Cardiac Monitoring: The patient was maintained  on a cardiac monitor.  I personally viewed and interpreted the cardiac monitored which showed an underlying rhythm of: NSR.  Sinus tachycardia  Social Determinants of Health:  Factors impacting patients care include: Previous history of polysubstance abuse   Reevaluation: After the interventions noted above, I reevaluated the patient and found that they have :improved  Co morbidities that complicate the patient evaluation  Past Medical History:  Diagnosis Date   GERD (gastroesophageal reflux disease)    Glaucoma    Hypertension    Schizoaffective disorder, depressive type (HCC)    "dx'd in the 1990's"   Schizophrenia, schizo-affective (HCC)       Dispostion: I considered admission for this patient, and due to large intracranial bleed patient require hospital admission     Final Clinical Impression(s) / ED Diagnoses Final diagnoses:  None     @PCDICTATION @    Glendora Score, MD 08/26/22 [phone]

## 2022-08-26 NOTE — Consult Note (Signed)
Reason for Consult: ICH with IVH Referring Physician: EDP  Damin Eberle is an 67 y.o. male.   HPI:  67 year old gentleman with hypertension, schizoaffective disorder, ascending aortic aneurysm, history of cocaine use comes in with mental status changes requiring intubation.  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  Patient is comatose and intubated and unable to operate with history and physical.  Past Medical History:  Diagnosis Date   GERD (gastroesophageal reflux disease)    Glaucoma    Hypertension    Schizoaffective disorder, depressive type (HCC)    "dx'd in the 1990's"   Schizophrenia, schizo-affective (HCC)     Past Surgical History:  Procedure Laterality Date   FRACTURE SURGERY  1996   jaw    INCISION AND DRAINAGE / EXCISION THYROGLOSSAL CYST  06/30/11   w/central hyoid resection   THYROGLOSSAL DUCT CYST  06/30/2011   Procedure: THYROGLOSSAL DUCT CYST;  Surgeon: Osborn Coho, MD;  Location: [facility] OR;  Service: ENT;  Laterality: N/A;  excision of thyroglossal duct cyst    No Known Allergies  Social History   Tobacco Use   Smoking status: Never   Smokeless tobacco: Never  Substance Use Topics   Alcohol use: Yes    Comment: 1 beer a month    Family History  Problem Relation Age of Onset   Hypertension Mother    Heart attack Mother    Cancer Father      Review of Systems  Positive ROS: Unable to obtain  All other systems have been reviewed and were otherwise negative with the exception of those mentioned in the HPI and as above.  Objective: Vital signs in last 24 hours: Temp:  [95.7 F (35.4 C)-98.5 F (36.9 C)] 97.9 F (36.6 C) (05/22 1425) Pulse Rate:  [25-114] 77 (05/22 1425) Resp:  [19-32] 26 (05/22 1425) BP: (75-164)/(62-124) 164/89 (05/22 1424) SpO2:  [68 %-100 %] 100 % (05/22 1425) FiO2 (%):  [60 %-100 %] 60 % (05/22 1424) Weight:  [95.3  kg] 95.3 kg (05/22 1305)  General Appearance: Intubated in a gurney Head: Normocephalic, without obvious abnormality, atraumatic Eyes: Pupils 2 mm and unreactive    Ears: Normal TM's and external ear canals, both ears Throat: benign Neck: Supple, symmetrical, trachea midline Lungs: Intubated Heart: Regular rate and rhythm Abdomen: Soft   NEUROLOGIC:   Mental status: Ptosis with GCS 6, E1 V1 M4 with slight flexion withdrawal of the upper extremities and internal rotation of lower extremities with noxious stimuli Motor Exam -slight flexion withdrawal to noxious stimuli Sensory Exam -as above Reflexes: Gait -unable to test Balance -unable to test Cranial Nerves: I: smell Not tested  II: visual acuity  OS: na    OD: na  II: visual fields   II: pupils 2 mm and unreactive  III,VII: ptosis   III,IV,VI: extraocular muscles    V: mastication   V: facial light touch sensation    V,VII: corneal reflex  Present  VII: facial muscle function - upper    VII: facial muscle function - lower   VIII: hearing   IX: soft palate elevation    IX,X: gag reflex Present  XI: trapezius strength    XI: sternocleidomastoid strength   XI: neck flexion strength    XII: tongue strength      Data Review Lab Results  Component Value Date   WBC 11.7 (H) 05/16/2021   HGB 12.2 (L) 08/26/2022   HCT  36.0 (L) 08/26/2022   MCV 85.2 05/16/2021   PLT 266 05/16/2021   Lab Results  Component Value Date   NA 141 08/26/2022   K 3.6 08/26/2022   CL 107 08/26/2022   CO2 24 05/16/2021   BUN 26 (H) 08/26/2022   CREATININE 1.10 08/26/2022   GLUCOSE 180 (H) 08/26/2022   Lab Results  Component Value Date   INR 1.2 05/13/2021    Radiology: CT Angio Chest/Abd/Pel for Dissection W and/or Wo Contrast  Result Date: 08/26/2022 CLINICAL DATA:  Acute aortic syndrome, arrest, found down EXAM: CT ANGIOGRAPHY CHEST, ABDOMEN AND PELVIS TECHNIQUE: Non-contrast CT of the chest was initially obtained. Multidetector  CT imaging through the chest, abdomen and pelvis was performed using the standard protocol during bolus administration of intravenous contrast. Multiplanar reconstructed images and MIPs were obtained and reviewed to evaluate the vascular anatomy. RADIATION DOSE REDUCTION: This exam was performed according to the departmental dose-optimization program which includes automated exposure control, adjustment of the mA and/or kV according to patient size and/or use of iterative reconstruction technique. CONTRAST:  OMNIPAQUE IOHEXOL 350 MG/ML SOLN COMPARISON:  CT chest angiogram, 07/15/2021 FINDINGS: CTA CHEST FINDINGS VASCULAR Aorta: Satisfactory opacification of the aorta. Enlargement of the distal aortic arch and proximal ascending thoracic aorta, measuring up to 4.3 x 4.3 cm (series 10, image 114, series 6, image 36). The tubular ascending thoracic aorta is normal in caliber. Minimal enlargement of the remaining descending thoracic aorta, measuring up to 3.2 x 3.2 cm (series 6, image 87). No evidence of dissection or other acute aortic pathology. No significant atherosclerosis Cardiovascular: No evidence of pulmonary embolism on limited non-tailored examination. Normal heart size. No pericardial effusion. Review of the MIP images confirms the above findings. NON VASCULAR Mediastinum/Nodes: No enlarged mediastinal, hilar, or axillary lymph nodes. Thyroid gland, trachea, and esophagus demonstrate no significant findings. Lungs/Pleura: Endotracheal intubation, endotracheal tube tip at the ostium of the right mainstem bronchus (series 6, image 45, series 9, image 85). Dependent bibasilar atelectasis or consolidation, greater on the left. Musculoskeletal: No chest wall abnormality. No acute osseous findings. Review of the MIP images confirms the above findings. CTA ABDOMEN AND PELVIS FINDINGS VASCULAR Normal contour and caliber of the abdominal aorta. No evidence of aneurysm, dissection, or other acute aortic  pathology. Mild mixed calcific atherosclerosis. 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 Review of the MIP images confirms the above findings. NON-VASCULAR Hepatobiliary: No solid liver abnormality is seen. Multiple simple liver cysts and/or hemangiomata, for which no further follow-up or characterization is required. No gallstones, gallbladder wall thickening, or biliary dilatation. Pancreas: Unremarkable. No pancreatic ductal dilatation or surrounding inflammatory changes. Spleen: Normal in size without significant abnormality. Adrenals/Urinary Tract: Benign, nodular adenomatous thickening of the adrenal glands, for which no specific further follow-up or characterization is required kidneys are normal, without renal calculi, solid lesion, or hydronephrosis. Bladder is unremarkable. Stomach/Bowel: Stomach is within normal limits. Appendix appears normal. No evidence of bowel wall thickening, distention, or inflammatory changes. Lymphatic: No enlarged abdominal or pelvic lymph nodes. Reproductive: No mass or other significant abnormality. Other: Small, fat containing bilateral inguinal hernias. No ascites. Musculoskeletal: No acute osseous findings. IMPRESSION: 1. No evidence of aortic dissection or other acute aortic pathology. Mild mixed calcific atherosclerosis of the abdominal aorta. 2. Unchanged enlargement of the distal aortic arch and proximal ascending thoracic aorta, measuring up to 4.3 x 4.3 cm. The tubular ascending thoracic aorta is normal in caliber.  3. Endotracheal intubation, endotracheal tube tip at the ostium of the right mainstem bronchus. Recommend retraction. 4. Dependent bibasilar atelectasis or consolidation, greater on the left. These results will be called to the ordering clinician or representative by the Radiologist Assistant, and communication documented in the PACS or [employer]. Electronically Signed   By:  Jearld Lesch M.D.   On: 08/26/2022 14:15   CT Head Wo Contrast  Result Date: 08/26/2022 CLINICAL DATA:  Delirium unresponsive, found down, concern for intracranial bleed EXAM: CT HEAD WITHOUT CONTRAST TECHNIQUE: Contiguous axial images were obtained from the base of the skull through the vertex without intravenous contrast. RADIATION DOSE REDUCTION: This exam was performed according to the departmental dose-optimization program which includes automated exposure control, adjustment of the mA and/or kV according to patient size and/or use of iterative reconstruction technique. COMPARISON:  None Available. FINDINGS: Brain: There is a 3.9 x 2.3 x 1.5 cm parenchymal hematoma centered in the left thalamus. There is intraventricular extension with associated communicating hydrocephalus. There is no CT evidence of an acute cortical infarct. No significant midline shift. Vascular: No hyperdense vessel or unexpected calcification. Skull: Normal. Negative for fracture or focal lesion. Sinuses/Orbits: No middle ear or mastoid effusion. Polypoid mucosal thickening bilateral maxillary sinuses. Orbits are unremarkable. Other: None. IMPRESSION: Acute parenchymal hematoma centered in the left thalamus (approximately 7 cc) with intraventricular extension and associated communicating hydrocephalus. Findings were discussed with Dr. Posey Rea on 08/26/22 at 2:02 PM. Electronically Signed   By: Lorenza Cambridge M.D.   On: 08/26/2022 14:03   DG Abdomen 1 View  Result Date: 08/26/2022 CLINICAL DATA:  OG tube placement. EXAM: ABDOMEN - 1 VIEW COMPARISON:  None Available. FINDINGS: An enteric tube terminates over the left upper quadrant in the expected region of the gastric body with the side port also well below the diaphragm. No dilated loops of bowel are seen in the included portion of the abdomen. No acute osseous abnormality is identified. IMPRESSION: Enteric tube in the stomach. Electronically Signed   By: Sebastian Ache M.D.   On:  08/26/2022 13:53   DG Chest Portable 1 View  Result Date: 08/26/2022 CLINICAL DATA:  Intubation. EXAM: PORTABLE CHEST 1 VIEW COMPARISON:  Chest radiographs 05/27/2021 FINDINGS: An endotracheal tube has been placed and terminates 3.5 cm above the carina. An enteric tube has been placed and courses into the abdomen with tip not included on this study. The cardiomediastinal silhouette is unchanged with normal heart size and chronic prominence of the aortic arch and descending aorta. Streaky perihilar and infrahilar opacities are present in the left greater than right lungs. No sizable pleural effusion or pneumothorax is identified. No acute osseous abnormality is seen. IMPRESSION: 1. Endotracheal tube in satisfactory position. 2. Streaky left greater than right lung opacities favored to reflect atelectasis. Correlate with pending CT. Electronically Signed   By: Sebastian Ache M.D.   On: 08/26/2022 13:52     Assessment/Plan: Estimated body mass index is 29.29 kg/m as calculated from the following:   Height as of this encounter: 5\' 11"  (1.803 m).   Weight as of this encounter: 95.3 kg.   Unfortunate 67 year old gentleman with hypertension, ascending aortic aneurysm, schizoaffective disorder, and history of cocaine use comes in with a large thalamic hemorrhage with extension into the ventricles with early hydrocephalus.  Unfortunately, the prognosis for excellent functional outcome is quite low.  We could consider ventriculostomy placement as this would potentially treat the hydrocephalus (though the risk of clotting off is substantial), but  I think it is the intracerebral hemorrhage with intraventricular hemorrhage that is making him so sick.  Ventriculostomy will not change that and therefore I do not think it will change the overall outcome though it may delay it or prolong it.  We have tried to call his daughter, his brother, and tried to find his girlfriend to discuss this is an option for aggressive  management, and to better try to understand the patient's goals of care and their goals of care before proceeding with this intervention.  I think we have a little bit of time still to try to figure this out prior to proceeding with an intervention he/they may or may not want.  Ventriculostomy and access kit are at the bedside.  Otherwise, he will be admitted by CCM and managed by CCM and neurology and we are certainly available for questions or concerns.   Tia Alert 08/26/2022 2:47 PM

## 2022-08-27 ENCOUNTER — Inpatient Hospital Stay (HOSPITAL_COMMUNITY): Payer: No Typology Code available for payment source

## 2022-08-27 DIAGNOSIS — R569 Unspecified convulsions: Secondary | ICD-10-CM | POA: Diagnosis not present

## 2022-08-27 DIAGNOSIS — I6389 Other cerebral infarction: Secondary | ICD-10-CM

## 2022-08-27 DIAGNOSIS — J988 Other specified respiratory disorders: Secondary | ICD-10-CM | POA: Diagnosis not present

## 2022-08-27 DIAGNOSIS — G910 Communicating hydrocephalus: Secondary | ICD-10-CM

## 2022-08-27 DIAGNOSIS — I610 Nontraumatic intracerebral hemorrhage in hemisphere, subcortical: Principal | ICD-10-CM

## 2022-08-27 LAB — MAGNESIUM
Magnesium: 2.1 mg/dL (ref 1.7–2.4)
Magnesium: 2.2 mg/dL (ref 1.7–2.4)

## 2022-08-27 LAB — ECHOCARDIOGRAM COMPLETE
AR max vel: 2.71 cm2
AV Area VTI: 2.63 cm2
AV Area mean vel: 2.53 cm2
AV Mean grad: 3 mmHg
AV Peak grad: 5.4 mmHg
Ao pk vel: 1.16 m/s
Area-P 1/2: 3.4 cm2
Height: 71 in
S' Lateral: 3.4 cm
Weight: 3128.77 oz

## 2022-08-27 LAB — CBC
HCT: 35.8 % — ABNORMAL LOW (ref 39.0–52.0)
Hemoglobin: 11.4 g/dL — ABNORMAL LOW (ref 13.0–17.0)
MCH: 27.3 pg (ref 26.0–34.0)
MCHC: 31.8 g/dL (ref 30.0–36.0)
MCV: 85.6 fL (ref 80.0–100.0)
Platelets: 220 10*3/uL (ref 150–400)
RBC: 4.18 MIL/uL — ABNORMAL LOW (ref 4.22–5.81)
RDW: 18.1 % — ABNORMAL HIGH (ref 11.5–15.5)
WBC: 9.6 10*3/uL (ref 4.0–10.5)
nRBC: 0 % (ref 0.0–0.2)

## 2022-08-27 LAB — POCT I-STAT 7, (LYTES, BLD GAS, ICA,H+H)
Acid-base deficit: 4 mmol/L — ABNORMAL HIGH (ref 0.0–2.0)
Bicarbonate: 17.7 mmol/L — ABNORMAL LOW (ref 20.0–28.0)
Calcium, Ion: 1.22 mmol/L (ref 1.15–1.40)
HCT: 35 % — ABNORMAL LOW (ref 39.0–52.0)
Hemoglobin: 11.9 g/dL — ABNORMAL LOW (ref 13.0–17.0)
O2 Saturation: 99 %
Patient temperature: 37.9
Potassium: 3.4 mmol/L — ABNORMAL LOW (ref 3.5–5.1)
Sodium: 152 mmol/L — ABNORMAL HIGH (ref 135–145)
TCO2: 18 mmol/L — ABNORMAL LOW (ref 22–32)
pCO2 arterial: 25.1 mmHg — ABNORMAL LOW (ref 32–48)
pH, Arterial: 7.459 — ABNORMAL HIGH (ref 7.35–7.45)
pO2, Arterial: 124 mmHg — ABNORMAL HIGH (ref 83–108)

## 2022-08-27 LAB — GLUCOSE, CAPILLARY
Glucose-Capillary: 101 mg/dL — ABNORMAL HIGH (ref 70–99)
Glucose-Capillary: 112 mg/dL — ABNORMAL HIGH (ref 70–99)
Glucose-Capillary: 143 mg/dL — ABNORMAL HIGH (ref 70–99)
Glucose-Capillary: 127 mg/dL — ABNORMAL HIGH (ref 70–99)
Glucose-Capillary: 140 mg/dL — ABNORMAL HIGH (ref 70–99)
Glucose-Capillary: 92 mg/dL (ref 70–99)

## 2022-08-27 LAB — CULTURE, BLOOD (ROUTINE X 2)

## 2022-08-27 LAB — PHOSPHORUS
Phosphorus: 1.4 mg/dL — ABNORMAL LOW (ref 2.5–4.6)
Phosphorus: 2.5 mg/dL (ref 2.5–4.6)

## 2022-08-27 LAB — BASIC METABOLIC PANEL
Anion gap: 9 (ref 5–15)
BUN: 14 mg/dL (ref 8–23)
CO2: 20 mmol/L — ABNORMAL LOW (ref 22–32)
Calcium: 8.6 mg/dL — ABNORMAL LOW (ref 8.9–10.3)
Chloride: 119 mmol/L — ABNORMAL HIGH (ref 98–111)
Creatinine, Ser: 1.08 mg/dL (ref 0.61–1.24)
GFR, Estimated: >60 mL/min (ref >60)
Glucose, Bld: 111 mg/dL — ABNORMAL HIGH (ref 70–99)
Potassium: 3.5 mmol/L (ref 3.5–5.1)
Sodium: 148 mmol/L — ABNORMAL HIGH (ref 135–145)

## 2022-08-27 LAB — SODIUM
Sodium: 152 mmol/L — ABNORMAL HIGH (ref 135–145)
Sodium: 151 mmol/L — ABNORMAL HIGH (ref 135–145)
Sodium: 153 mmol/L — ABNORMAL HIGH (ref 135–145)

## 2022-08-27 LAB — LIPID PANEL
Cholesterol: 155 mg/dL (ref 0–200)
HDL: 33 mg/dL — ABNORMAL LOW (ref >40)
LDL Cholesterol: 101 mg/dL — ABNORMAL HIGH (ref 0–99)
Total CHOL/HDL Ratio: 4.7 RATIO
Triglycerides: 105 mg/dL (ref <150)
VLDL: 21 mg/dL (ref 0–40)

## 2022-08-27 LAB — TRIGLYCERIDES: Triglycerides: 114 mg/dL (ref <150)

## 2022-08-27 MED ORDER — POTASSIUM PHOSPHATES 15 MMOLE/5ML IV SOLN
45.0000 mmol | Freq: Once | INTRAVENOUS | Status: AC
  Administered 2022-08-27: 45 mmol via INTRAVENOUS
  Filled 2022-08-27: qty 15

## 2022-08-27 MED ORDER — LOSARTAN POTASSIUM 50 MG PO TABS
50.0000 mg | ORAL_TABLET | Freq: Every day | ORAL | Status: DC
  Administered 2022-08-27 – 2022-08-30 (×4): 50 mg
  Filled 2022-08-27 (×4): qty 1

## 2022-08-27 MED ORDER — SODIUM CHLORIDE 0.9 % IV BOLUS
500.0000 mL | Freq: Once | INTRAVENOUS | Status: AC
  Administered 2022-08-27: 500 mL via INTRAVENOUS

## 2022-08-27 MED ORDER — PROSOURCE TF20 ENFIT COMPATIBL EN LIQD: 60.0000 mL | Freq: Every day | ENTERAL | Status: DC

## 2022-08-27 MED ORDER — VITAL HIGH PROTEIN PO LIQD: 1000.0000 mL | ORAL | Status: DC

## 2022-08-27 MED ORDER — OSMOLITE 1.5 CAL PO LIQD
1000.0000 mL | ORAL | Status: DC
  Administered 2022-08-27 – 2022-09-05 (×9): 1000 mL

## 2022-08-27 MED ORDER — SODIUM CHLORIDE 0.9 % IV BOLUS
1000.0000 mL | Freq: Once | INTRAVENOUS | Status: AC
  Administered 2022-08-27: 1000 mL via INTRAVENOUS

## 2022-08-27 MED ORDER — HYDRALAZINE HCL 20 MG/ML IJ SOLN
10.0000 mg | Freq: Four times a day (QID) | INTRAMUSCULAR | Status: DC | PRN
  Administered 2022-08-27 – 2022-09-03 (×9): 10 mg via INTRAVENOUS
  Filled 2022-08-27 (×9): qty 1

## 2022-08-27 MED ORDER — PROSOURCE TF20 ENFIT COMPATIBL EN LIQD
60.0000 mL | Freq: Two times a day (BID) | ENTERAL | Status: DC
  Administered 2022-08-27: 10 mL
  Administered 2022-08-27 – 2022-09-05 (×18): 60 mL
  Filled 2022-08-27 (×19): qty 60

## 2022-08-27 NOTE — Progress Notes (Signed)
[facility] ADULT ICU REPLACEMENT PROTOCOL   The patient does apply for the [facility] Adult ICU Electrolyte Replacment Protocol based on the criteria listed below:   1.Exclusion criteria: TCTS, ECMO, Dialysis, and Myasthenia Gravis patients 2. Is GFR >/= 30 ml/min? Yes.    Patient's GFR today is >60 3. Is SCr </= 2? Yes.   Patient's SCr is 1.08 mg/dL 4. Did SCr increase >/= 0.5 in 24 hours? No. 5.Pt's weight >40kg  Yes.   6. Abnormal electrolyte(s): potassium 3.5, phos 1.4  7. Electrolytes replaced per protocol 8.  Call MD STAT for K+ </= 2.5, Phos </= 1, or Mag </= 1 Physician:  protocol  Melvern Banker 08/27/2022 4:45 AM

## 2022-08-27 NOTE — Procedures (Addendum)
Patient Name: [name]  MRN: [id number]  Epilepsy Attending: Charlsie Quest  Referring Physician/Provider: Jefferson Fuel, MD  Duration: 08/27/2022 1535 to 08/28/2022 1522  Patient history: 67 y.o. male presenting with unresponsiveness.  CT Head done and shows large left thalamic IPH with IVH and hydrocephalus. Intermittent nonrhythmic shaking spells involving LUE. EEG to evaluate for seizure.    Level of alertness: comatose  AEDs during EEG study: LEV, propofol  Technical aspects: This EEG study was done with scalp electrodes positioned according to the 10-20 International system of electrode placement. Electrical activity was reviewed with band pass filter of 1-70Hz , sensitivity of 7 uV/mm, display speed of 64mm/sec with a 60Hz  notched filter applied as appropriate. EEG data were recorded continuously and digitally stored.  Video monitoring was available and reviewed as appropriate.  Description: EEG showed continuous generalized 3 to 6 Hz theta-delta slowing with overriding 13-15hz  Hz beta activity distributed symmetrically and diffusely. Hyperventilation and photic stimulation were not performed.     ABNORMALITY - Continuous slow, generalized  IMPRESSION: This study is suggestive of severe diffuse encephalopathy, nonspecific etiology but likely related to sedation. No seizures or epileptiform discharges were seen throughout the recording.  Romana Deaton Annabelle Harman

## 2022-08-27 NOTE — Progress Notes (Signed)
LTM EEG discontinued - no skin breakdown at unhook.   

## 2022-08-27 NOTE — Progress Notes (Signed)
OT Cancellation Note  Patient Details Name: Keyton Winiarski MRN: [id number] DOB: Sep 11, 1955   Cancelled Treatment:    Reason Eval/Treat Not Completed: Active bedrest order- Noted pt on bedrest until 1541 today. Also noted still ventilated and with ventriculostomy. Will monitor for appropriateness to proceed with activity/OT eval.  Barry Brunner, OT Acute Rehabilitation Services Office [phone]   [name] 08/27/2022, 8:23 AM

## 2022-08-27 NOTE — TOC CM/SW Note (Signed)
Patient has [employer] insurance coverage; hospital admission has been reported to [place] by admitting.    Reference Number:[id number]   Quintella Baton, RN, BSN  Trauma/Neuro ICU Case Manager [phone]

## 2022-08-27 NOTE — Progress Notes (Signed)
Pt transported to CT and back to room on vent without incident.

## 2022-08-27 NOTE — Progress Notes (Signed)
SLP Cancellation Note  Patient Details Name: Jimmy Marshall MRN: [id number] DOB: Nov 11, 1955   Cancelled treatment:       Reason Eval/Treat Not Completed: Patient not medically ready. Intubated.    Keashia Haskins, Riley Nearing 08/27/2022, 12:00 PM

## 2022-08-27 NOTE — Progress Notes (Signed)
NAME:  Jimmy Marshall, MRN:  [id number], DOB:  Aug 27, 1955, LOS: 1 ADMISSION DATE:  08/26/2022 CONSULTATION DATE:  08/26/2022 REFERRING MD:  Kommor - EDP CHIEF COMPLAINT:  AMS, ICH   History of Present Illness:  67 year old man who presented to [facility] ED 5/22 after being found down at a hotel.  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  Patient is intubated and sedated, therefore history is obtained from chart review.  Per EMS, patient is currently residing in a hotel and was found down by staff who went to collect rent from patient.  Found unresponsive, RR 4 and Narcan 1mg  (IN) and 3mg  IV given with no response.  Intubated on arrival to ED. CXR notable for left greater than right atelectasis.  Per EDP, BP very labile; hypertensive on admission then dropping BP to SBP 70s requiring Neo pushes.  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  Neurosurgery was consulted.  CTA Chest/A/P negative for aortic dissection, overall unchanged enlargement of distal aortic arch/ascending thoracic aorta.  PCCM consulted for ICU admission.  Pertinent Medical History:   Past Medical History:  Diagnosis Date   GERD (gastroesophageal reflux disease)    Glaucoma    Hypertension    Schizoaffective disorder, depressive type (HCC)    "dx'd in the 1990's"   Schizophrenia, schizo-affective (HCC)    Significant Hospital Events: Including procedures, antibiotic start and stop dates in addition to other pertinent events   5/22 -presented via EMS after being found down at hotel.  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 Neuro/Stroke consulted. Neurosurgery consulted. EVD placement.  Interim History / Subjective:  PCCM consulted for ICU admission  Objective:  Blood  pressure (!) 129/92, pulse 75, temperature 99.3 F (37.4 C), temperature source Bladder, resp. rate 20, height 5\' 11"  (1.803 m), weight 88.7 kg, SpO2 98 %.    Vent Mode: PRVC FiO2 (%):  [30 %-100 %] 30 % Set Rate:  [20 bmp-26 bmp] 20 bmp Vt Set:  [600 mL] 600 mL PEEP:  [5 cmH20] 5 cmH20 Plateau Pressure:  [16 cmH20-21 cmH20] 18 cmH20   Intake/Output Summary (Last 24 hours) at 08/27/2022 0916 Last data filed at 08/27/2022 0800 Gross per 24 hour  Intake 2641.7 ml  Output 2070 ml  Net 571.7 ml   Filed Weights   08/26/22 1305 08/27/22 0500  Weight: 95.3 kg 88.7 kg   Physical Examination: General: Acutely ill-appearing middle-aged man Intubated, sedated. HEENT: Barney/AT, anicteric sclera, pupils equal round 2mm, reactive, moist mucous membranes. ETT in place. Neuro: Sedated.  Will occasionally follow comands when asked to move left foot only. + Corneal reflex. RUE and RLE flacid, LUE not moved on command. CV: RRR, no m/g/r. PULM: Breathing even and unlabored on vent (PEEP 5, FiO2 30%). Lung fields CTAB in upper fields. GI: Soft, nontender, nondistended. Extremities: Trace bilateral symmetric LE edema noted. Skin: Warm/dry, no rashes.  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 - NSGY following for EVD - Neuro/Stroke following - Goal SBP 130-150 for first 24 hours, then 120-160 - Cleviprex titrated to goal SBP - S/p Keppra load, AEDs per Neuro - HTS 3% per Neuro - LTM EEG - Frequent neuro checks - Neuroprotective  measures: HOB > 30 degrees, normoglycemia, normothermia, electrolytes WNL - PT/OT/SLP when able to participate in care  Altered mental status with acute encephalopathy, likely related to IPH + toxic metabolic - Neuro workup as above - F/u UDS, LA - Correct metabolic  derangements as able - PAD protocol  Acute hypoxemic respiratory failure Possible aspiration event - Continue full vent support (4-8cc/kg IBW) - Wean FiO2 for O2 sat > 90% - Daily WUA/SBT - VAP bundle - Pulmonary hygiene - PAD protocol for sedation: Propofol and Fentanyl for goal RASS 0 to -1 - Follow CXR, ABG - Resp Cx  Thoracic aortic intramural hematoma and descending thoracic aortic aneurysm HTN Noted on scan 08/2021 4.6 cm. - BP control as above with Cleviprex - SBP goal per above - Resume home medications as appropriate  Schizoaffective disorder/schizophrenia - Resume home Zyprexa, Remeron as clinically appropriate  GERD - PPI  At risk for hyperglycemia - Hold home metformin - SSI - CBGs Q4H - Goal CBG 140-180  Best Practice: (right click and "Reselect all SmartList Selections" daily)   Diet/type: NPO DVT prophylaxis: SCDs GI prophylaxis: PPI Lines: Arterial Line Foley:  Yes, and it is still needed Code Status:  full code Last date of multidisciplinary goals of care discussion [5/22 - Per patient's brother, Harvie Heck, to his knowledge patient would want all interventions.]  Labs:  CBC: Recent Labs  Lab 08/26/22 1408 08/26/22 1420 08/26/22 1614 08/27/22 0208 08/27/22 0234  WBC  --  11.7*  --  9.6  --   NEUTROABS  --  10.8*  --   --   --   HGB 12.2* 12.7* 12.9* 11.4* 11.9*  HCT 36.0* 41.8 38.0* 35.8* 35.0*  MCV  --  89.9  --  85.6  --   PLT  --  195  --  220  --    Basic Metabolic Panel: Recent Labs  Lab 08/26/22 1317 08/26/22 1408 08/26/22 1420 08/26/22 1614 08/26/22 1933 08/27/22 0208 08/27/22 0234 08/27/22 0737  NA 143 141 141  140 148* 147* 148* 152* 152*  K 5.1 3.6 3.4* 3.7  --  3.5 3.4*  --   CL 107  --  106  --   --  119*  --   --   CO2  --   --  18*  --   --  20*  --   --   GLUCOSE 180*  --  138*  --   --  111*  --   --   BUN 26*  --  18  --   --  14  --   --   CREATININE 1.10  --  1.28*  --   --  1.08  --   --   CALCIUM  --   --   8.9  --   --  8.6*  --   --   MG  --   --   --   --   --  2.1  --   --   PHOS  --   --   --   --   --  1.4*  --   --    GFR: Estimated Creatinine Clearance: 73.6 mL/min (by C-G formula based on SCr of 1.08 mg/dL). Recent Labs  Lab 08/26/22 1311 08/26/22 1420 08/26/22 1421 08/26/22 1510 08/27/22 0208  PROCALCITON  --   --   --  0.18  --   WBC  --  11.7*  --   --  9.6  LATICACIDVEN 4.3*  --  4.4*  --   --     Liver Function Tests: Recent Labs  Lab 08/26/22 1420  AST 24  ALT 18  ALKPHOS 52  BILITOT 0.8  PROT 7.0  ALBUMIN 3.9   No results for input(s): "LIPASE", "AMYLASE" in the last 168 hours. No results for input(s): "AMMONIA" in the last 168 hours.  ABG:    Component Value Date/Time   PHART 7.459 (H) 08/27/2022 0234   PCO2ART 25.1 (L) 08/27/2022 0234   PO2ART 124 (H) 08/27/2022 0234   HCO3 17.7 (L) 08/27/2022 0234   TCO2 18 (L) 08/27/2022 0234   ACIDBASEDEF 4.0 (H) 08/27/2022 0234   O2SAT 99 08/27/2022 0234    Coagulation Profile: No results for input(s): "INR", "PROTIME" in the last 168 hours.  Cardiac Enzymes: No results for input(s): "CKTOTAL", "CKMB", "CKMBINDEX", "TROPONINI" in the last 168 hours.  HbA1C: Hgb A1c MFr Bld  Date/Time Value Ref Range Status  08/26/2022 04:06 PM 5.6 4.8 - 5.6 % Final    Comment:    (NOTE) Pre diabetes:          5.7%-6.4%  Diabetes:              >6.4%  Glycemic control for   <7.0% adults with diabetes   05/13/2021 12:57 AM 5.3 4.8 - 5.6 % Final    Comment:    (NOTE) Pre diabetes:          5.7%-6.4%  Diabetes:              >6.4%  Glycemic control for   <7.0% adults with diabetes    CBG: Recent Labs  Lab 08/26/22 1625 08/26/22 1924 08/26/22 2317 08/27/22 0309 08/27/22 0811  GLUCAP 85 102* 125* 101* 112*    Review of Systems:   Patient is encephalopathic and/or intubated; therefore, history has been obtained from chart review.   Past Medical History:  He,  has a past medical history of GERD  (gastroesophageal reflux disease), Glaucoma, Hypertension, Schizoaffective disorder, depressive type (HCC), and Schizophrenia, schizo-affective (HCC).   Surgical History:   Past Surgical History:  Procedure Laterality Date   FRACTURE SURGERY  1996   jaw    INCISION AND DRAINAGE / EXCISION THYROGLOSSAL CYST  06/30/11   w/central hyoid resection   THYROGLOSSAL DUCT CYST  06/30/2011   Procedure: THYROGLOSSAL DUCT CYST;  Surgeon: Osborn Coho, MD;  Location: [facility] OR;  Service: ENT;  Laterality: N/A;  excision of thyroglossal duct cyst   Social History:   reports that he has never smoked. He has never used smokeless tobacco. He reports current alcohol use. He reports current drug use. Drugs: Marijuana and Cocaine.   Family History:  His family history includes Cancer in his father; Heart attack in his mother; Hypertension in his mother.   Allergies: No Known Allergies   Home Medications: Prior to Admission medications   Medication Sig Start Date End Date Taking? Authorizing Provider  diltiazem (CARTIA XT) 240 MG 24 hr capsule Take 1 capsule (240 mg total) by mouth every morning. 08/10/22 09/09/22  Tolia, Sunit, DO  losartan (COZAAR) 100 MG tablet Take 1 tablet (100 mg total) by mouth daily at 10 pm. 08/10/22 09/09/22  Tolia, Sunit, DO  metFORMIN (GLUCOPHAGE) 500 MG tablet Take 500 mg by mouth 2 (two) times daily with a meal.    [provider]  mirtazapine (REMERON) 15 MG tablet Take 15 mg by mouth at bedtime. 02/27/20   [provider]  OLANZapine (  ZYPREXA) 10 MG tablet Take 10 mg by mouth at bedtime.    [provider]  OLANZapine (ZYPREXA) 20 MG tablet Take 20 mg by mouth at bedtime.    [provider]  spironolactone (ALDACTONE) 25 MG tablet Take 1 tablet (25 mg total) by mouth every morning. 08/10/22 09/09/22  Tessa Lerner, DO   Critical care time:    The patient is critically ill with multiple organ system failure and requires high complexity decision making  for assessment and support, frequent evaluation and titration of therapies, advanced monitoring, review of radiographic studies and interpretation of complex data.   Critical Care Time devoted to patient care services, exclusive of separately billable procedures, described in this note is 41 minutes.  Steward Drone Belliston-Fowkes, Medical Student 08/27/22 9:16 AM   From 7A-7P if no response, please call [phone] After hours, please call ELink [phone]

## 2022-08-27 NOTE — Progress Notes (Signed)
Subjective: Patient intubated and sedated  Objective: Vital signs in last 24 hours: Temp:  [95.7 F (35.4 C)-100.4 F (38 C)] 99.3 F (37.4 C) (05/23 0715) Pulse Rate:  [25-114] 82 (05/23 0715) Resp:  [16-32] 20 (05/23 0715) BP: (75-164)/(62-124) 106/84 (05/23 0715) SpO2:  [68 %-100 %] 95 % (05/23 0715) Arterial Line BP: (111-199)/(65-122) 124/82 (05/23 0715) FiO2 (%):  [30 %-100 %] 30 % (05/23 0715) Weight:  [88.7 kg-95.3 kg] 88.7 kg (05/23 0500)  Intake/Output from previous day: 05/22 0701 - 05/23 0700 In: 2469.4 [I.V.:1795.5; NG/GT:75; IV Piggyback:598.9] Out: 2002 [Urine:1865; Drains:137] Intake/Output this shift: No intake/output data recorded.  Neurologic: withdraws to noxious stimuli in upper extremities.   Lab Results: Lab Results  Component Value Date   WBC 9.6 08/27/2022   HGB 11.9 (L) 08/27/2022   HCT 35.0 (L) 08/27/2022   MCV 85.6 08/27/2022   PLT 220 08/27/2022   Lab Results  Component Value Date   INR 1.2 05/13/2021   BMET Lab Results  Component Value Date   NA 152 (H) 08/27/2022   K 3.4 (L) 08/27/2022   CL 119 (H) 08/27/2022   CO2 20 (L) 08/27/2022   GLUCOSE 111 (H) 08/27/2022   BUN 14 08/27/2022   CREATININE 1.08 08/27/2022   CALCIUM 8.6 (L) 08/27/2022    Studies/Results: DG Chest Port 1 View  Result Date: 08/27/2022 CLINICAL DATA:  67 year old male status post acute intracranial hemorrhage. Intubated. EXAM: PORTABLE CHEST 1 VIEW COMPARISON:  Portable chest 08/26/2022 and earlier. FINDINGS: Portable AP semi upright view at 0524 hours. Endotracheal tube tip in satisfactory position just below the clavicles. Enteric tube courses into the left abdomen, tip not included. Tortuous thoracic aorta. Stable cardiac size and mediastinal contours. No pneumothorax or pulmonary edema. Streaky lung base opacity greater on the left has not significantly changed. No obvious pleural effusion. Paucity of bowel gas.  Stable visualized osseous structures.  IMPRESSION: 1.  Stable lines and tubes. 2. Stable streaky lung base opacity greater on the left. Favor atelectasis. 3. No new cardiopulmonary abnormality. Electronically Signed   By: Odessa Fleming M.D.   On: 08/27/2022 06:11   CT HEAD WO CONTRAST ( )  Result Date: 08/27/2022 CLINICAL DATA:  67 year old male with intracranial hemorrhage, found down. EXAM: CT HEAD WITHOUT CONTRAST TECHNIQUE: Contiguous axial images were obtained from the base of the skull through the vertex without intravenous contrast. RADIATION DOSE REDUCTION: This exam was performed according to the departmental dose-optimization program which includes automated exposure control, adjustment of the mA and/or kV according to patient size and/or use of iterative reconstruction technique. COMPARISON:  Head CT 08/26/2022. FINDINGS: Brain: New external ventricular drain placed from a superior right frontal approach and terminates in the right lateral ventricle near the septum pellucidum on coronal image 31. But moderate to large volume of intraventricular hemorrhage remains. Ventricle size does appear slightly decreased from yesterday afternoon. Intra-axial hemorrhage in the left thalamus size and configuration stable. Surrounding hypodense edema. Small volume of subarachnoid hemorrhage in the posterior fossa also, favor subarachnoid extension from the 4th ventricle. No definite new areas of hemorrhage. Stable gray-white matter differentiation throughout the brain. Stable basilar cistern patency. There is trace rightward midline shift now, 2-3 mm. Vascular: Calcified atherosclerosis at the skull base. Skull: Small new superior right frontal bone burr hole. Elsewhere the skull appears stable and intact. Sinuses/Orbits: Trace new paranasal sinus fluid in the setting of intubation. Left maxillary retention cysts. Tympanic cavities and mastoids remain aerated. Other: Scalp EEG leads now. And  new postoperative changes to the right scalp vertex related to EVD.  Visualized orbit soft tissues are within normal limits. Partially visible endotracheal tube. IMPRESSION: 1. Intra-axial component of left thalamic hemorrhage appears stable in size and configuration. 2. New right superior frontal approach external ventricular drain placed, terminates in the right lateral ventricle. 3. Ventricular system may be slightly smaller but otherwise no significant improvement moderate to large intraventricular volume of blood, and small volume of subarachnoid blood in the posterior fossa which probably extended from the 4th ventricle. 4. Mild new rightward midline shift, 2-3 mm. 5. No other new intracranial abnormality. Electronically Signed   By: Odessa Fleming M.D.   On: 08/27/2022 06:10   DG Chest Port 1 View  Result Date: 08/26/2022 CLINICAL DATA:  History of ETT EXAM: PORTABLE CHEST 1 VIEW COMPARISON:  Radiograph 08/26/2022 FINDINGS: Endotracheal tube overlies the midthoracic trachea approximately 4.3 cm above the carina. Unchanged cardiomediastinal silhouette with tortuous aorta. Nasogastric tube passes below the diaphragm, tip excluded by collimation. Bibasilar atelectasis, left greater than right. No new airspace disease. No pleural effusion or evidence of pneumothorax. Bones are unchanged. IMPRESSION: Endotracheal tube tip overlies the midthoracic trachea approximately 4.3 cm above the carina. Unchanged cardiomediastinal silhouette. Bibasilar atelectasis.  No new airspace disease. Electronically Signed   By: Caprice Renshaw M.D.   On: 08/26/2022 15:50   CT Angio Chest/Abd/Pel for Dissection W and/or Wo Contrast  Result Date: 08/26/2022 CLINICAL DATA:  Acute aortic syndrome, arrest, found down EXAM: CT ANGIOGRAPHY CHEST, ABDOMEN AND PELVIS TECHNIQUE: Non-contrast CT of the chest was initially obtained. Multidetector CT imaging through the chest, abdomen and pelvis was performed using the standard protocol during bolus administration of intravenous contrast. Multiplanar reconstructed  images and MIPs were obtained and reviewed to evaluate the vascular anatomy. RADIATION DOSE REDUCTION: This exam was performed according to the departmental dose-optimization program which includes automated exposure control, adjustment of the mA and/or kV according to patient size and/or use of iterative reconstruction technique. CONTRAST:  OMNIPAQUE IOHEXOL 350 MG/ML SOLN COMPARISON:  CT chest angiogram, 07/15/2021 FINDINGS: CTA CHEST FINDINGS VASCULAR Aorta: Satisfactory opacification of the aorta. Enlargement of the distal aortic arch and proximal ascending thoracic aorta, measuring up to 4.3 x 4.3 cm (series 10, image 114, series 6, image 36). The tubular ascending thoracic aorta is normal in caliber. Minimal enlargement of the remaining descending thoracic aorta, measuring up to 3.2 x 3.2 cm (series 6, image 87). No evidence of dissection or other acute aortic pathology. No significant atherosclerosis Cardiovascular: No evidence of pulmonary embolism on limited non-tailored examination. Normal heart size. No pericardial effusion. Review of the MIP images confirms the above findings. NON VASCULAR Mediastinum/Nodes: No enlarged mediastinal, hilar, or axillary lymph nodes. Thyroid gland, trachea, and esophagus demonstrate no significant findings. Lungs/Pleura: Endotracheal intubation, endotracheal tube tip at the ostium of the right mainstem bronchus (series 6, image 45, series 9, image 85). Dependent bibasilar atelectasis or consolidation, greater on the left. Musculoskeletal: No chest wall abnormality. No acute osseous findings. Review of the MIP images confirms the above findings. CTA ABDOMEN AND PELVIS FINDINGS VASCULAR Normal contour and caliber of the abdominal aorta. No evidence of aneurysm, dissection, or other acute aortic pathology. Mild mixed calcific atherosclerosis. 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 Review of the  MIP images confirms the above findings. NON-VASCULAR Hepatobiliary: No solid liver abnormality is seen. Multiple simple liver cysts and/or hemangiomata, for which no further follow-up or  characterization is required. No gallstones, gallbladder wall thickening, or biliary dilatation. Pancreas: Unremarkable. No pancreatic ductal dilatation or surrounding inflammatory changes. Spleen: Normal in size without significant abnormality. Adrenals/Urinary Tract: Benign, nodular adenomatous thickening of the adrenal glands, for which no specific further follow-up or characterization is required kidneys are normal, without renal calculi, solid lesion, or hydronephrosis. Bladder is unremarkable. Stomach/Bowel: Stomach is within normal limits. Appendix appears normal. No evidence of bowel wall thickening, distention, or inflammatory changes. Lymphatic: No enlarged abdominal or pelvic lymph nodes. Reproductive: No mass or other significant abnormality. Other: Small, fat containing bilateral inguinal hernias. No ascites. Musculoskeletal: No acute osseous findings. IMPRESSION: 1. No evidence of aortic dissection or other acute aortic pathology. Mild mixed calcific atherosclerosis of the abdominal aorta. 2. Unchanged enlargement of the distal aortic arch and proximal ascending thoracic aorta, measuring up to 4.3 x 4.3 cm. The tubular ascending thoracic aorta is normal in caliber. 3. Endotracheal intubation, endotracheal tube tip at the ostium of the right mainstem bronchus. Recommend retraction. 4. Dependent bibasilar atelectasis or consolidation, greater on the left. These results will be called to the ordering clinician or representative by the Radiologist Assistant, and communication documented in the PACS or [employer]. Electronically Signed   By: Jearld Lesch M.D.   On: 08/26/2022 14:15   CT Head Wo Contrast  Result Date: 08/26/2022 CLINICAL DATA:  Delirium unresponsive, found down, concern for intracranial bleed  EXAM: CT HEAD WITHOUT CONTRAST TECHNIQUE: Contiguous axial images were obtained from the base of the skull through the vertex without intravenous contrast. RADIATION DOSE REDUCTION: This exam was performed according to the departmental dose-optimization program which includes automated exposure control, adjustment of the mA and/or kV according to patient size and/or use of iterative reconstruction technique. COMPARISON:  None Available. FINDINGS: Brain: There is a 3.9 x 2.3 x 1.5 cm parenchymal hematoma centered in the left thalamus. There is intraventricular extension with associated communicating hydrocephalus. There is no CT evidence of an acute cortical infarct. No significant midline shift. Vascular: No hyperdense vessel or unexpected calcification. Skull: Normal. Negative for fracture or focal lesion. Sinuses/Orbits: No middle ear or mastoid effusion. Polypoid mucosal thickening bilateral maxillary sinuses. Orbits are unremarkable. Other: None. IMPRESSION: Acute parenchymal hematoma centered in the left thalamus (approximately 7 cc) with intraventricular extension and associated communicating hydrocephalus. Findings were discussed with Dr. Posey Rea on 08/26/22 at 2:02 PM. Electronically Signed   By: Lorenza Cambridge M.D.   On: 08/26/2022 14:03   DG Abdomen 1 View  Result Date: 08/26/2022 CLINICAL DATA:  OG tube placement. EXAM: ABDOMEN - 1 VIEW COMPARISON:  None Available. FINDINGS: An enteric tube terminates over the left upper quadrant in the expected region of the gastric body with the side port also well below the diaphragm. No dilated loops of bowel are seen in the included portion of the abdomen. No acute osseous abnormality is identified. IMPRESSION: Enteric tube in the stomach. Electronically Signed   By: Sebastian Ache M.D.   On: 08/26/2022 13:53   DG Chest Portable 1 View  Result Date: 08/26/2022 CLINICAL DATA:  Intubation. EXAM: PORTABLE CHEST 1 VIEW COMPARISON:  Chest radiographs 05/27/2021  FINDINGS: An endotracheal tube has been placed and terminates 3.5 cm above the carina. An enteric tube has been placed and courses into the abdomen with tip not included on this study. The cardiomediastinal silhouette is unchanged with normal heart size and chronic prominence of the aortic arch and descending aorta. Streaky perihilar and infrahilar opacities are present in the  left greater than right lungs. No sizable pleural effusion or pneumothorax is identified. No acute osseous abnormality is seen. IMPRESSION: 1. Endotracheal tube in satisfactory position. 2. Streaky left greater than right lung opacities favored to reflect atelectasis. Correlate with pending CT. Electronically Signed   By: Sebastian Ache M.D.   On: 08/26/2022 13:52    Assessment/Plan: S/p thalamic hemorrhage with intraventricular extension and mild hydrocephalus. EVD is draining well. No change in neuro status. CT head stable and catheter in good position.    LOS: 1 day    Tiana Loft Cyrah Mclamb 08/27/2022, 7:50 AM

## 2022-08-27 NOTE — Progress Notes (Signed)
LTM maint complete - no skin breakdown under:  CZ, PZ.  Drain placed overnight and F4 electrode removed.

## 2022-08-27 NOTE — Progress Notes (Signed)
Initial Nutrition Assessment  DOCUMENTATION CODES:   Not applicable  INTERVENTION:   Initiate tube feeding via OG tube: Osmolite 1.5 at 25 ml/h and increase by 10 ml every 8 hours to goal rate of 55 ml/hr (1320 ml per day) Prosource TF20 60 ml BID  Provides 2140 kcal, 122 gm protein, 1003 ml free water daily    NUTRITION DIAGNOSIS:   Increased nutrient needs related to acute illness as evidenced by estimated needs.  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    Pt discussed during ICU rounds and with RN and MD.   Sherron Monday with pt's daughter who reports that pt has had no recent weight changes and a good appetite PTA.   Per daughter pt eats out for Breakfast and Dinner and eats a sandwich at lunch. Unable to provide specifics.   Medications reviewed and include: colace, SSI, protonix, miralax Fentanyl  Hypertonic saline @ 75 ml/hr  Labs reviewed:  Na 151 K 3.4 PO4 2.5 Mag 2.2 A1C 5.6 CBG's: 112-143  EVD: 137 ml  18 F OG tube; per xray in gastric body  NUTRITION - FOCUSED PHYSICAL EXAM:  Pt having ECHO done  Diet Order:   Diet Order             Diet NPO time specified  Diet effective now                   EDUCATION NEEDS:   No education needs have been identified at this time  Skin:  Skin Assessment: Reviewed RN Assessment  Last BM:  unknown  Height:   Ht Readings from Last 1 Encounters:  08/26/22 5\' 11"  (1.803 m)    Weight:   Wt Readings from Last 1 Encounters:  08/27/22 88.7 kg     BMI:  Body mass index is 27.27 kg/m.  Estimated Nutritional Needs:   Kcal:  2100-2300  Protein:  110-125 grams  Fluid:  >2 L/day  Cammy Copa., RD, LDN, CNSC See AMiON for contact information

## 2022-08-27 NOTE — Progress Notes (Addendum)
STROKE TEAM PROGRESS NOTE   INTERVAL HISTORY His daughter is at the bedside and all patient questions are answered.  Patient's daughter states the patient was living alone and feels that he was not taking his antihypertensive medications as prescribed.   Bedside RN reports that the patient intermittently follows commands so patient did not follow commands for stroke team.  Propofol off on exam this morning the patient remains on a fentanyl drip.  Overnight EEG suggests severe diffuse encephalopathy, nonspecific etiology but likely related to sedation.  No seizures or epileptiform discharges were seen throughout the recording.  Repeat CT head this morning: stable left thalamic hemorrhage. Ventricular system may be slightly smaller but otherwise no significant improvement in moderate to large IVH volume of blood and small volume of subarachnoid blood in the posterior fossa which probably extended from the fourth ventricle.  Mild new rightward midline shift, 2 to 3 mm. Patient has been started on hypertonic saline drip and serum sodium is nearly at goal at 148 Vitals:   08/27/22 0645 08/27/22 0700 08/27/22 0715 08/27/22 0800  BP:   106/84 (!) 129/92  Pulse: 85 85 82 75  Resp: 20 20 20 20   Temp: 99.5 F (37.5 C) 99.5 F (37.5 C) 99.3 F (37.4 C) 99.3 F (37.4 C)  TempSrc:    Bladder  SpO2: 95% 95% 95% 98%  Weight:      Height:       CBC:  Recent Labs  Lab 08/26/22 1420 08/26/22 1614 08/27/22 0208 08/27/22 0234  WBC 11.7*  --  9.6  --   NEUTROABS 10.8*  --   --   --   HGB 12.7*   < > 11.4* 11.9*  HCT 41.8   < > 35.8* 35.0*  MCV 89.9  --  85.6  --   PLT 195  --  220  --    < > = values in this interval not displayed.   Basic Metabolic Panel:  Recent Labs  Lab 08/26/22 1420 08/26/22 1614 08/27/22 0208 08/27/22 0234 08/27/22 0737  NA 141  140   < > 148* 152* 152*  K 3.4*   < > 3.5 3.4*  --   CL 106  --  119*  --   --   CO2 18*  --  20*  --   --   GLUCOSE 138*  --  111*   --   --   BUN 18  --  14  --   --   CREATININE 1.28*  --  1.08  --   --   CALCIUM 8.9  --  8.6*  --   --   MG  --   --  2.1  --   --   PHOS  --   --  1.4*  --   --    < > = values in this interval not displayed.   Lipid Panel:  Recent Labs  Lab 08/27/22 0208  TRIG 114   HgbA1c:  Recent Labs  Lab 08/26/22 1606  HGBA1C 5.6   Urine Drug Screen:  Recent Labs  Lab 08/26/22 1429  LABOPIA NONE DETECTED  COCAINSCRNUR NONE DETECTED  LABBENZ POSITIVE*  AMPHETMU NONE DETECTED  THCU POSITIVE*  LABBARB NONE DETECTED    Alcohol Level No results for input(s): "ETH" in the last 168 hours.  IMAGING past 24 hours Overnight EEG with video  Result Date: 08/27/2022 Charlsie Quest, MD     08/27/2022  8:17 AM Patient Name: Jimmy Fenton  Marshall MRN: [id number] Epilepsy Attending: Charlsie Quest Referring Physician/Provider: Jefferson Fuel, MD Duration: 08/27/2022 1535 to 08/28/2022 0815 Patient history: 67 y.o. male presenting with unresponsiveness.  CT Head done and shows large left thalamic IPH with IVH and hydrocephalus. Intermittent nonrhythmic shaking spells involving LUE. EEG to evaluate for seizure.  Level of alertness: comatose AEDs during EEG study: LEV, propofol Technical aspects: This EEG study was done with scalp electrodes positioned according to the 10-20 International system of electrode placement. Electrical activity was reviewed with band pass filter of 1-70Hz , sensitivity of 7 uV/mm, display speed of 61mm/sec with a 60Hz  notched filter applied as appropriate. EEG data were recorded continuously and digitally stored.  Video monitoring was available and reviewed as appropriate. Description: EEG showed continuous generalized 3 to 6 Hz theta-delta slowing with overriding 13-15hz  Hz beta activity distributed symmetrically and diffusely. Hyperventilation and photic stimulation were not performed.   ABNORMALITY - Continuous slow, generalized IMPRESSION: This study is suggestive of severe diffuse  encephalopathy, nonspecific etiology but likely related to sedation. No seizures or epileptiform discharges were seen throughout the recording. Charlsie Quest   DG Chest Port 1 View  Result Date: 08/27/2022 CLINICAL DATA:  67 year old male status post acute intracranial hemorrhage. Intubated. EXAM: PORTABLE CHEST 1 VIEW COMPARISON:  Portable chest 08/26/2022 and earlier. FINDINGS: Portable AP semi upright view at 0524 hours. Endotracheal tube tip in satisfactory position just below the clavicles. Enteric tube courses into the left abdomen, tip not included. Tortuous thoracic aorta. Stable cardiac size and mediastinal contours. No pneumothorax or pulmonary edema. Streaky lung base opacity greater on the left has not significantly changed. No obvious pleural effusion. Paucity of bowel gas.  Stable visualized osseous structures. IMPRESSION: 1.  Stable lines and tubes. 2. Stable streaky lung base opacity greater on the left. Favor atelectasis. 3. No new cardiopulmonary abnormality. Electronically Signed   By: Odessa Fleming M.D.   On: 08/27/2022 06:11   CT HEAD WO CONTRAST ( )  Result Date: 08/27/2022 CLINICAL DATA:  67 year old male with intracranial hemorrhage, found down. EXAM: CT HEAD WITHOUT CONTRAST TECHNIQUE: Contiguous axial images were obtained from the base of the skull through the vertex without intravenous contrast. RADIATION DOSE REDUCTION: This exam was performed according to the departmental dose-optimization program which includes automated exposure control, adjustment of the mA and/or kV according to patient size and/or use of iterative reconstruction technique. COMPARISON:  Head CT 08/26/2022. FINDINGS: Brain: New external ventricular drain placed from a superior right frontal approach and terminates in the right lateral ventricle near the septum pellucidum on coronal image 31. But moderate to large volume of intraventricular hemorrhage remains. Ventricle size does appear slightly decreased from  yesterday afternoon. Intra-axial hemorrhage in the left thalamus size and configuration stable. Surrounding hypodense edema. Small volume of subarachnoid hemorrhage in the posterior fossa also, favor subarachnoid extension from the 4th ventricle. No definite new areas of hemorrhage. Stable gray-white matter differentiation throughout the brain. Stable basilar cistern patency. There is trace rightward midline shift now, 2-3 mm. Vascular: Calcified atherosclerosis at the skull base. Skull: Small new superior right frontal bone burr hole. Elsewhere the skull appears stable and intact. Sinuses/Orbits: Trace new paranasal sinus fluid in the setting of intubation. Left maxillary retention cysts. Tympanic cavities and mastoids remain aerated. Other: Scalp EEG leads now. And new postoperative changes to the right scalp vertex related to EVD. Visualized orbit soft tissues are within normal limits. Partially visible endotracheal tube. IMPRESSION: 1. Intra-axial component of left thalamic hemorrhage appears  stable in size and configuration. 2. New right superior frontal approach external ventricular drain placed, terminates in the right lateral ventricle. 3. Ventricular system may be slightly smaller but otherwise no significant improvement moderate to large intraventricular volume of blood, and small volume of subarachnoid blood in the posterior fossa which probably extended from the 4th ventricle. 4. Mild new rightward midline shift, 2-3 mm. 5. No other new intracranial abnormality. Electronically Signed   By: Odessa Fleming M.D.   On: 08/27/2022 06:10   DG Chest Port 1 View  Result Date: 08/26/2022 CLINICAL DATA:  History of ETT EXAM: PORTABLE CHEST 1 VIEW COMPARISON:  Radiograph 08/26/2022 FINDINGS: Endotracheal tube overlies the midthoracic trachea approximately 4.3 cm above the carina. Unchanged cardiomediastinal silhouette with tortuous aorta. Nasogastric tube passes below the diaphragm, tip excluded by collimation.  Bibasilar atelectasis, left greater than right. No new airspace disease. No pleural effusion or evidence of pneumothorax. Bones are unchanged. IMPRESSION: Endotracheal tube tip overlies the midthoracic trachea approximately 4.3 cm above the carina. Unchanged cardiomediastinal silhouette. Bibasilar atelectasis.  No new airspace disease. Electronically Signed   By: Caprice Renshaw M.D.   On: 08/26/2022 15:50   CT Angio Chest/Abd/Pel for Dissection W and/or Wo Contrast  Result Date: 08/26/2022 CLINICAL DATA:  Acute aortic syndrome, arrest, found down EXAM: CT ANGIOGRAPHY CHEST, ABDOMEN AND PELVIS TECHNIQUE: Non-contrast CT of the chest was initially obtained. Multidetector CT imaging through the chest, abdomen and pelvis was performed using the standard protocol during bolus administration of intravenous contrast. Multiplanar reconstructed images and MIPs were obtained and reviewed to evaluate the vascular anatomy. RADIATION DOSE REDUCTION: This exam was performed according to the departmental dose-optimization program which includes automated exposure control, adjustment of the mA and/or kV according to patient size and/or use of iterative reconstruction technique. CONTRAST:  OMNIPAQUE IOHEXOL 350 MG/ML SOLN COMPARISON:  CT chest angiogram, 07/15/2021 FINDINGS: CTA CHEST FINDINGS VASCULAR Aorta: Satisfactory opacification of the aorta. Enlargement of the distal aortic arch and proximal ascending thoracic aorta, measuring up to 4.3 x 4.3 cm (series 10, image 114, series 6, image 36). The tubular ascending thoracic aorta is normal in caliber. Minimal enlargement of the remaining descending thoracic aorta, measuring up to 3.2 x 3.2 cm (series 6, image 87). No evidence of dissection or other acute aortic pathology. No significant atherosclerosis Cardiovascular: No evidence of pulmonary embolism on limited non-tailored examination. Normal heart size. No pericardial effusion. Review of the MIP images confirms the  above findings. NON VASCULAR Mediastinum/Nodes: No enlarged mediastinal, hilar, or axillary lymph nodes. Thyroid gland, trachea, and esophagus demonstrate no significant findings. Lungs/Pleura: Endotracheal intubation, endotracheal tube tip at the ostium of the right mainstem bronchus (series 6, image 45, series 9, image 85). Dependent bibasilar atelectasis or consolidation, greater on the left. Musculoskeletal: No chest wall abnormality. No acute osseous findings. Review of the MIP images confirms the above findings. CTA ABDOMEN AND PELVIS FINDINGS VASCULAR Normal contour and caliber of the abdominal aorta. No evidence of aneurysm, dissection, or other acute aortic pathology. Mild mixed calcific atherosclerosis. 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 Review of the MIP images confirms the above findings. NON-VASCULAR Hepatobiliary: No solid liver abnormality is seen. Multiple simple liver cysts and/or hemangiomata, for which no further follow-up or characterization is required. No gallstones, gallbladder wall thickening, or biliary dilatation. Pancreas: Unremarkable. No pancreatic ductal dilatation or surrounding inflammatory changes. Spleen: Normal in size without significant abnormality. Adrenals/Urinary Tract: Benign, nodular adenomatous  thickening of the adrenal glands, for which no specific further follow-up or characterization is required kidneys are normal, without renal calculi, solid lesion, or hydronephrosis. Bladder is unremarkable. Stomach/Bowel: Stomach is within normal limits. Appendix appears normal. No evidence of bowel wall thickening, distention, or inflammatory changes. Lymphatic: No enlarged abdominal or pelvic lymph nodes. Reproductive: No mass or other significant abnormality. Other: Small, fat containing bilateral inguinal hernias. No ascites. Musculoskeletal: No acute osseous findings. IMPRESSION: 1. No evidence of  aortic dissection or other acute aortic pathology. Mild mixed calcific atherosclerosis of the abdominal aorta. 2. Unchanged enlargement of the distal aortic arch and proximal ascending thoracic aorta, measuring up to 4.3 x 4.3 cm. The tubular ascending thoracic aorta is normal in caliber. 3. Endotracheal intubation, endotracheal tube tip at the ostium of the right mainstem bronchus. Recommend retraction. 4. Dependent bibasilar atelectasis or consolidation, greater on the left. These results will be called to the ordering clinician or representative by the Radiologist Assistant, and communication documented in the PACS or [employer]. Electronically Signed   By: Jearld Lesch M.D.   On: 08/26/2022 14:15   CT Head Wo Contrast  Result Date: 08/26/2022 CLINICAL DATA:  Delirium unresponsive, found down, concern for intracranial bleed EXAM: CT HEAD WITHOUT CONTRAST TECHNIQUE: Contiguous axial images were obtained from the base of the skull through the vertex without intravenous contrast. RADIATION DOSE REDUCTION: This exam was performed according to the departmental dose-optimization program which includes automated exposure control, adjustment of the mA and/or kV according to patient size and/or use of iterative reconstruction technique. COMPARISON:  None Available. FINDINGS: Brain: There is a 3.9 x 2.3 x 1.5 cm parenchymal hematoma centered in the left thalamus. There is intraventricular extension with associated communicating hydrocephalus. There is no CT evidence of an acute cortical infarct. No significant midline shift. Vascular: No hyperdense vessel or unexpected calcification. Skull: Normal. Negative for fracture or focal lesion. Sinuses/Orbits: No middle ear or mastoid effusion. Polypoid mucosal thickening bilateral maxillary sinuses. Orbits are unremarkable. Other: None. IMPRESSION: Acute parenchymal hematoma centered in the left thalamus (approximately 7 cc) with intraventricular extension and  associated communicating hydrocephalus. Findings were discussed with Dr. Posey Rea on 08/26/22 at 2:02 PM. Electronically Signed   By: Lorenza Cambridge M.D.   On: 08/26/2022 14:03   DG Abdomen 1 View  Result Date: 08/26/2022 CLINICAL DATA:  OG tube placement. EXAM: ABDOMEN - 1 VIEW COMPARISON:  None Available. FINDINGS: An enteric tube terminates over the left upper quadrant in the expected region of the gastric body with the side port also well below the diaphragm. No dilated loops of bowel are seen in the included portion of the abdomen. No acute osseous abnormality is identified. IMPRESSION: Enteric tube in the stomach. Electronically Signed   By: Sebastian Ache M.D.   On: 08/26/2022 13:53   DG Chest Portable 1 View  Result Date: 08/26/2022 CLINICAL DATA:  Intubation. EXAM: PORTABLE CHEST 1 VIEW COMPARISON:  Chest radiographs 05/27/2021 FINDINGS: An endotracheal tube has been placed and terminates 3.5 cm above the carina. An enteric tube has been placed and courses into the abdomen with tip not included on this study. The cardiomediastinal silhouette is unchanged with normal heart size and chronic prominence of the aortic arch and descending aorta. Streaky perihilar and infrahilar opacities are present in the left greater than right lungs. No sizable pleural effusion or pneumothorax is identified. No acute osseous abnormality is seen. IMPRESSION: 1. Endotracheal tube in satisfactory position. 2. Streaky left greater than right lung  opacities favored to reflect atelectasis. Correlate with pending CT. Electronically Signed   By: Sebastian Ache M.D.   On: 08/26/2022 13:52    PHYSICAL EXAM Constitutional: Critically ill-appearing African-American male laying in ICU bed, in no acute distress Psych: Unable to assess due to patient's condition, critically ill intubated in the ICU Eyes: No scleral injection HENT: ETT in place with securement device, EVD at the right superior scalp MSK: no joint deformities or  swelling Cardiovascular: Normal rate and regular rhythm.  Respiratory: Respirations assisted via mechanical ventilation, ETT in place, spontaneous respirations present over set vent rate GI: Soft.  No distension. There is no tenderness.  Skin: WDI  Neuro: Mental Status: Patient is intubated on fentanyl drip in the ICU. Patient does not follow commands.  Patient does not attempt to communicate with examiner. Patient opens eyes to loud voice. Cranial Nerves: II: Does not blink to threat bilaterally PERRL 2 mm and sluggish III,IV, VI: Bilateral eyes with slightly downward deviation. Spontaneous eye movements noted with passive eye opening of the vertical plane but no horizontal movement appreciated.  V: Corneals intact VII: Facial symmetry is obscured due to ETT and securement device VIII: Hearing is intact to voice IX/X: Cough and gag intact XI: Head is XII: Does not protrude tongue to command Motor/Sensory: Does not follow commands.  Patient spontaneously moves left lower and upper extremity.  With application of noxious stimuli, patient withdrawals to left upper extremity.  Patient is flaccid on the right upper and lower extremity. Cerebellar: Does not perform  ASSESSMENT/PLAN Jimmy Marshall is a 67 y.o. 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  Acute left thalamic parenchymal hematoma with IVH and associated communicating hydrocephalus s/p ventriculostomy placement 08/26/22. Etiology: likely hypertensive  CT Head: acute parenchymal hematoma centered in the  left thalamus with IVH and associated communicating hydrocephalus  Repeat [facility] 08/27/22:  Intra-axial component of the left thalamic hemorrhage appears stable in size and configuration New right superior frontal approach external ventricular drain places, terminates in the right lateral ventricle Ventricular system may be slightly smaller but otherwise no significant improvement moderate to large intraventricular volume of blood, and small volume subarachnoid blood in the posterior fossa which probably extended from the 4th ventricle Mild new rightward midline shift, 2-3 mm No other new intracranial abnormality  CTA head & neck scheduled for tomorrow morning 08/27/20 2D Echo LVEF 50-55%.  LDL 101 HgbA1c 5.6% EEG overnight is suggestive of severe diffuse encephalopathy of nonspecific etiology though likely related to sedation.  No seizures or epileptiform discharges seen throughout recording. Continue EEG monitoring VTE prophylaxis - SCDs in the setting of ICH     Diet   Diet NPO time specified   No antithrombotic prior to admission, now on No antithrombotic due to IPH Therapy recommendations:  pending Disposition:  pending  Communicating hydrocephalus s/p EVD placement Hypertonic saline 3% HTS 3% s/p 250 mL bolus, 75 cc/h gtt  Na 148 > 152 overnight  Continue at current rate Goal Na 150-155 If Na > 160, stop infusion and notify provider Na checks  Hypertension requiring cleviprex gtt Home meds:  losartan, diltiazem Unstable Continues on cleviprex gtt  Add home losartan  PRN labetalol and hydralazine for hypertension  Blood pressure goal systolic 130-150 for 24 hours then liberalize blood pressure goal to SBP < Permissive hypertension (OK if < 220/120) but gradually normalize in 5-7 days Long-term BP goal normotensive  Hyperlipidemia Home meds: None LDL 101, goal < 70 Consider adding statin at discharge  Prediabetes On home Metformin, held on admission A1c  pending CBGs: 138, 111 SSI as needed  Other Stroke Risk Factors Substance abuse - UDS:  THC POSITIVE, Cocaine NONE DETECTED. Patient advised to stop using due to stroke risk.  Other Active Problems Schizoaffective disorder, depressive type On Zyprexa Glaucoma  Hospital day # 1  Lanae Boast, AGACNP-BC [facility] Pager: [phone]  STROKE MD NOTE :  I have personally obtained history,examined this patient, reviewed notes, independently viewed imaging studies, participated in medical decision making and plan of care.ROS completed by me personally and pertinent positives fully documented  I have made any additions or clarifications directly to the above note. Agree with note above.  Patient presented with sudden onset of unresponsiveness and was intubated upon arrival to the ED and found to be slightly hypertensive.  CT head however showed a large left thalamic parenchymal hemorrhage with intraventricular extension and hydrocephalus.  Patient had ventriculostomy placed and follow-up CT scan showed stable appearance of the with slight decrease in ventricular size hematoma.  He remains sedated and intubated and on ventilatory support for respiratory failure.  Continue close neurological monitoring and strict blood pressure control with systolic goal 130-150) close when below 160.  Use as needed IV labetalol and hydralazine and wean Cleviprex drip.  Continue hypertonic saline drip with serum sodium goal 150-155.  Trial of extubation in the next few days when patient is more alert and able to protect his airway.  Discussed with Dr. Craige Cotta critical care medicine.  Long discussion with patient's daughter at the bedside answered questions. This patient is critically ill and at significant risk of neurological worsening, death and care requires constant monitoring of vital signs, hemodynamics,respiratory and cardiac monitoring, extensive review of multiple databases, frequent neurological  assessment, discussion with family, other specialists and medical decision making of high complexity.I have made any additions or clarifications directly to the above note.This critical care time does not reflect procedure time, or teaching time or supervisory time of PA/NP/Med Resident etc but could involve care discussion time.  I spent 30 minutes of neurocritical care time  in the care of  this patient.     Delia Heady, MD Medical Director [facility] Pager: [phone] 08/27/2022 3:12 PM  To contact Stroke Continuity provider, please refer to [email or web address]. After hours, contact General Neurology

## 2022-08-27 NOTE — Progress Notes (Addendum)
PT Cancellation Note  Patient Details Name: Jimmy Marshall MRN: [id number] DOB: Aug 22, 1955   Cancelled Treatment:    Reason Eval/Treat Not Completed: Active bedrest order  Noted pt on bedrest until 1541 today. Also noted still ventilated and with ventriculostomy. Will monitor for appropriateness to proceed with activity/PT eval.   Jerolyn Center, PT Acute Rehabilitation Services  Office [phone]   Zena Amos 08/27/2022, 7:32 AM

## 2022-08-28 ENCOUNTER — Inpatient Hospital Stay (HOSPITAL_COMMUNITY): Payer: No Typology Code available for payment source

## 2022-08-28 DIAGNOSIS — I610 Nontraumatic intracerebral hemorrhage in hemisphere, subcortical: Secondary | ICD-10-CM | POA: Diagnosis not present

## 2022-08-28 LAB — CBC
HCT: 32.5 % — ABNORMAL LOW (ref 39.0–52.0)
Hemoglobin: 10.2 g/dL — ABNORMAL LOW (ref 13.0–17.0)
MCH: 27.3 pg (ref 26.0–34.0)
MCHC: 31.4 g/dL (ref 30.0–36.0)
MCV: 87.1 fL (ref 80.0–100.0)
Platelets: 178 10*3/uL (ref 150–400)
RBC: 3.73 MIL/uL — ABNORMAL LOW (ref 4.22–5.81)
RDW: 18.7 % — ABNORMAL HIGH (ref 11.5–15.5)
WBC: 9.5 10*3/uL (ref 4.0–10.5)
nRBC: 0 % (ref 0.0–0.2)

## 2022-08-28 LAB — GLUCOSE, CAPILLARY
Glucose-Capillary: 109 mg/dL — ABNORMAL HIGH (ref 70–99)
Glucose-Capillary: 103 mg/dL — ABNORMAL HIGH (ref 70–99)
Glucose-Capillary: 127 mg/dL — ABNORMAL HIGH (ref 70–99)
Glucose-Capillary: 117 mg/dL — ABNORMAL HIGH (ref 70–99)
Glucose-Capillary: 134 mg/dL — ABNORMAL HIGH (ref 70–99)

## 2022-08-28 LAB — SODIUM
Sodium: 158 mmol/L — ABNORMAL HIGH (ref 135–145)
Sodium: 160 mmol/L — ABNORMAL HIGH (ref 135–145)
Sodium: 159 mmol/L — ABNORMAL HIGH (ref 135–145)

## 2022-08-28 LAB — HEMOGLOBIN A1C
Hgb A1c MFr Bld: 6 % — ABNORMAL HIGH (ref 4.8–5.6)
Mean Plasma Glucose: 126 mg/dL

## 2022-08-28 LAB — BASIC METABOLIC PANEL
Anion gap: 6 (ref 5–15)
BUN: 16 mg/dL (ref 8–23)
CO2: 20 mmol/L — ABNORMAL LOW (ref 22–32)
Calcium: 8 mg/dL — ABNORMAL LOW (ref 8.9–10.3)
Chloride: 130 mmol/L — ABNORMAL HIGH (ref 98–111)
Creatinine, Ser: 0.96 mg/dL (ref 0.61–1.24)
GFR, Estimated: >60 mL/min (ref >60)
Glucose, Bld: 104 mg/dL — ABNORMAL HIGH (ref 70–99)
Potassium: 3.6 mmol/L (ref 3.5–5.1)
Sodium: 156 mmol/L — ABNORMAL HIGH (ref 135–145)

## 2022-08-28 LAB — MAGNESIUM
Magnesium: 2.3 mg/dL (ref 1.7–2.4)
Magnesium: 2.7 mg/dL — ABNORMAL HIGH (ref 1.7–2.4)

## 2022-08-28 LAB — PHOSPHORUS
Phosphorus: 2.1 mg/dL — ABNORMAL LOW (ref 2.5–4.6)
Phosphorus: 2.6 mg/dL (ref 2.5–4.6)

## 2022-08-28 MED ORDER — ENOXAPARIN SODIUM 40 MG/0.4ML IJ SOSY
40.0000 mg | PREFILLED_SYRINGE | INTRAMUSCULAR | Status: DC
  Administered 2022-08-28 – 2022-09-05 (×9): 40 mg via SUBCUTANEOUS
  Filled 2022-08-28 (×9): qty 0.4

## 2022-08-28 MED ORDER — LEVETIRACETAM 500 MG PO TABS
500.0000 mg | ORAL_TABLET | Freq: Two times a day (BID) | ORAL | Status: AC
  Administered 2022-08-28 – 2022-09-01 (×9): 500 mg
  Filled 2022-08-28 (×9): qty 1

## 2022-08-28 MED ORDER — FREE WATER
200.0000 mL | Status: DC
  Administered 2022-08-28 – 2022-08-29 (×5): 200 mL

## 2022-08-28 MED ORDER — IOHEXOL 350 MG/ML SOLN
75.0000 mL | Freq: Once | INTRAVENOUS | Status: AC | PRN
  Administered 2022-08-28: 75 mL via INTRAVENOUS

## 2022-08-28 MED ORDER — POTASSIUM PHOSPHATES 15 MMOLE/5ML IV SOLN
15.0000 mmol | Freq: Once | INTRAVENOUS | Status: AC
  Administered 2022-08-28: 15 mmol via INTRAVENOUS
  Filled 2022-08-28: qty 5

## 2022-08-28 MED ORDER — POTASSIUM CHLORIDE 20 MEQ PO PACK
20.0000 meq | PACK | Freq: Once | ORAL | Status: AC
  Administered 2022-08-28: 20 meq
  Filled 2022-08-28: qty 1

## 2022-08-28 NOTE — TOC CAGE-AID Note (Signed)
Transition of Care [facility]) - CAGE-AID Screening   Patient Details  Name: Jimmy Marshall MRN: [id number] Date of Birth: 17-Jun-1955  Transition of Care [facility]) CM/SW Contact:    Mearl Latin, LCSW Phone Number: 08/28/2022, 3:56 PM   Clinical Narrative: Patient is intubated and unable to participate.    CAGE-AID Screening: Substance Abuse Screening unable to be completed due to: : Patient unable to participate

## 2022-08-28 NOTE — Progress Notes (Signed)
OT Cancellation Note  Patient Details Name: Jimmy Marshall MRN: [id number] DOB: [birth year]   Cancelled Treatment:    Reason Eval/Treat Not Completed: Active bedrest order  Pt remains on bed rest, continues with EVD and on vent with no command follow per RN. In the process of weaning patient off vent. Acute OT to return as able, as appropriate, when activity orders are advanced.   Barry Brunner, OT Acute Rehabilitation Services Office [phone]  [name] 08/28/2022, 12:33 PM

## 2022-08-28 NOTE — Progress Notes (Signed)
[facility] ADULT ICU REPLACEMENT PROTOCOL   The patient does apply for the [facility] Adult ICU Electrolyte Replacment Protocol based on the criteria listed below:   1.Exclusion criteria: TCTS, ECMO, Dialysis, and Myasthenia Gravis patients 2. Is GFR >/= 30 ml/min? Yes.    Patient's GFR today is >60 3. Is SCr </= 2? Yes.   Patient's SCr is 0.96 mg/dL 4. Did SCr increase >/= 0.5 in 24 hours? No. 5.Pt's weight >40kg  Yes.   6. Abnormal electrolyte(s): Phos 2.1, K+ 3.6  7. Electrolytes replaced per protocol 8.  Call MD STAT for K+ </= 2.5, Phos </= 1, or Mag </= 1 Physician:  [name] [facility] 08/28/2022 4:17 AM

## 2022-08-28 NOTE — Progress Notes (Signed)
STROKE TEAM PROGRESS NOTE   INTERVAL HISTORY Patient remains intubated and sedated.  He opens eyes to stimulation but does not follow commands.  He is purposeful movements on the left side but remains hemiplegic on the right.  Eyes are today in primary position 7 beats of nystagmus.  Serum sodium is above goal at 156 this morning and hypertonic saline drip has been placed on hold.  Blood pressure adequately controlled. Vitals:   08/28/22 0800 08/28/22 0900 08/28/22 1000 08/28/22 1100  BP: 115/80 (!) 127/93 121/80 130/86  Pulse: 64 67 (!) 57 61  Resp: 18 17 18 18   Temp: 98.4 F (36.9 C)     TempSrc: Bladder     SpO2: 98% 99% 99% 99%  Weight:      Height:       CBC:  Recent Labs  Lab 08/26/22 1420 08/26/22 1614 08/27/22 0208 08/27/22 0234 08/28/22 0155  WBC 11.7*  --  9.6  --  9.5  NEUTROABS 10.8*  --   --   --   --   HGB 12.7*   < > 11.4* 11.9* 10.2*  HCT 41.8   < > 35.8* 35.0* 32.5*  MCV 89.9  --  85.6  --  87.1  PLT 195  --  220  --  178   < > = values in this interval not displayed.   Basic Metabolic Panel:  Recent Labs  Lab 08/27/22 0208 08/27/22 0234 08/27/22 0737 08/27/22 1441 08/27/22 2021 08/28/22 0155 08/28/22 0808  NA 148* 152*   < >  --    < > 156* 158*  K 3.5 3.4*  --   --   --  3.6  --   CL 119*  --   --   --   --  130*  --   CO2 20*  --   --   --   --  20*  --   GLUCOSE 111*  --   --   --   --  104*  --   BUN 14  --   --   --   --  16  --   CREATININE 1.08  --   --   --   --  0.96  --   CALCIUM 8.6*  --   --   --   --  8.0*  --   MG 2.1  --   --  2.2  --  2.3  --   PHOS 1.4*  --   --  2.5  --  2.1*  --    < > = values in this interval not displayed.   Lipid Panel:  Recent Labs  Lab 08/27/22 0208  CHOL 155  TRIG 105  114  HDL 33*  CHOLHDL 4.7  VLDL 21  LDLCALC 096*   HgbA1c:  Recent Labs  Lab 08/27/22 0208  HGBA1C 6.0*   Urine Drug Screen:  Recent Labs  Lab 08/26/22 1429  LABOPIA NONE DETECTED  COCAINSCRNUR NONE DETECTED   LABBENZ POSITIVE*  AMPHETMU NONE DETECTED  THCU POSITIVE*  LABBARB NONE DETECTED    Alcohol Level No results for input(s): "ETH" in the last 168 hours.  IMAGING past 24 hours DG Abd Portable 1V  Result Date: 08/28/2022 CLINICAL DATA:  [id number] Encounter for imaging study to confirm orogastric (OG) tube placement [id number] EXAM: PORTABLE ABDOMEN - 1 VIEW COMPARISON:  CT 08/26/2022. FINDINGS: Nasogastric tube tip and side port overlie the stomach. Nonobstructive bowel gas pattern. Retained contrast  material in the renal collecting systems. IMPRESSION: Nasogastric tube tip and side port overlie the stomach. Nonobstructive bowel gas pattern. Electronically Signed   By: Caprice Renshaw M.D.   On: 08/28/2022 08:11   CT ANGIO HEAD NECK W WO CM  Result Date: 08/28/2022 CLINICAL DATA:  67 year old male found down with intracranial hemorrhage. Left thalamic bleed with intraventricular and subarachnoid extension of blood. EVD placed. EXAM: CT ANGIOGRAPHY HEAD AND NECK WITH AND WITHOUT CONTRAST TECHNIQUE: Multidetector CT imaging of the head and neck was performed using the standard protocol during bolus administration of intravenous contrast. Multiplanar CT image reconstructions and MIPs were obtained to evaluate the vascular anatomy. Carotid stenosis measurements (when applicable) are obtained utilizing NASCET criteria, using the distal internal carotid diameter as the denominator. RADIATION DOSE REDUCTION: This exam was performed according to the departmental dose-optimization program which includes automated exposure control, adjustment of the mA and/or kV according to patient size and/or use of iterative reconstruction technique. CONTRAST:  75mL OMNIPAQUE IOHEXOL 350 MG/ML SOLN COMPARISON:  Head CT 08/27/2022 and earlier. Chest CTA 08/26/2022. FINDINGS: CT HEAD Brain: Oval and confluent left thalamic hematoma encompasses 13 x 39 x 22 mm (AP by transverse by CC), about 1 mm smaller in each direction since  presentation on 08/26/2022. Ongoing large volume of intraventricular blood. Stable EVD terminating in the right lateral ventricle near the septum pellucidum. Stable ventricle size and configuration. Stable mostly pre medullary cistern subarachnoid blood extension. Increased hypodense edema in and around the thalamus since presentation. But regional mass effect has not significantly changed. There is stable mild rightward midline shift about 2 mm. Stable gray-white differentiation elsewhere. No cortically based acute infarct identified. Basilar cisterns remain patent. Calvarium and skull base: Stable right vertex burr hole. No acute osseous abnormality identified. Paranasal sinuses: Increasing layering sphenoid sinus fluid in the setting of intubation. Left maxillary retention cyst again noted. Tympanic cavities and mastoids remain well aerated. Orbits: Largely resolved postoperative right scalp vertex soft tissue gas. No new orbit or scalp soft tissue finding. CTA NECK Skeleton: Some limited cervical spine bony detail due to extensive paravertebral venous contrast reflux. No acute osseous abnormality identified. Cervical spine degeneration. Upper chest: Endotracheal tube tip partially visible above the carina. Lung apices are clear. Enteric tube visible in the thoracic esophagus at that level. There appears to be functional stenosis of the left innominate vein in the superior mediastinum and part related to tortuous arch and great vessels. Left upper extremity contrast injection. Other neck: Intubated with appropriate course of the visible ET and enteric tube. Pronounced venous contrast reflux in the neck, especially the left IJ and paravertebral venous system. No acute soft tissue finding identified. Aortic arch: Unchanged aneurysmal distal aortic arch/proximal descending thoracic aorta as demonstrated on chest CTA 08/26/2022 (please see that report) 3 vessel arch configuration. Minor arch atherosclerosis. Right  carotid system: Incompletely visible brachiocephalic artery today but negative recently. Right CCA origin remains normal. Capacious right carotid bifurcation. No right carotid plaque or stenosis in the neck. Mild tortuosity. Left carotid system: Similar mild tortuosity with no plaque or stenosis to the skull base. Vertebral arteries: Mildly tortuous proximal right subclavian artery. Right vertebral artery origin appears normal. Right V1 and V2 segment detail partially obscured by paravertebral venous contrast. But the right vertebral artery is patent to the skull base, grossly normal. Proximal left subclavian artery mild tortuosity. Normal left vertebral artery origin. Normal left V1 segment. Left V2 and V3 detail intermittently limited by paravertebral venous contrast but the  left vertebral artery is patent to the skull base, grossly normal. CTA HEAD Posterior circulation: Codominant vertebral V4 segments and vertebrobasilar junction appear normal. Normal left PICA and dominant appearing right AICA origins. Patent basilar artery without stenosis. Patent SCA origins. Fetal type PCA origins more so the left. Unremarkable basilar tip. Bilateral PCA branches are within normal limits. No left thalamic hemorrhage CTA spot sign. No abnormal vascularity identified in association with the hematoma. Anterior circulation: Both ICA siphons are patent. Mild calcified supraclinoid plaque bilaterally. No siphon stenosis. Bilateral posterior communicating artery origins appear normal. Patent carotid termini. Dominant right and diminutive or absent left ACA A1 segments. Normal anterior communicating artery. Bilateral ACA branches are within normal limits. Left MCA M1 segment and bifurcation are patent without stenosis. Right MCA M1 segment and trifurcation are patent without stenosis. Bilateral MCA branches are within normal limits. Venous sinuses: Intracranial venous structures appear grossly patent. The straight sinus, vein of  [place] and internal cerebral veins are patent and enhancing. Anatomic variants: Dominant right and diminutive or absent left ACA A1 segments. Fetal type PCA origins. Review of the MIP images confirms the above findings IMPRESSION: 1. Left thalamic hematoma is stable to slightly smaller since presentation. Surrounding edema with mild regional mass effect. 2. Stable EVD. Ongoing relatively large volume of intraventricular hemorrhage with stable ventricle size and configuration. Small volume mostly pre medullary cistern SAH is stable. 3. Stable intracranial mass effect with mild rightward midline shift (2 mm). No new intracranial abnormality. 4. CTA is negative for aneurysm, CTA spot sign, or vascular malformation in association with the left thalamic hematoma. And no significant atherosclerosis or arterial stenosis in the head or neck. 5. 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 See Chest CTA 08/26/2022. 6. Satisfactory visible endotracheal and enteric tubes. Electronically Signed   By: Odessa Fleming M.D.   On: 08/28/2022 05:46   ECHOCARDIOGRAM COMPLETE  Result Date: 08/27/2022    ECHOCARDIOGRAM REPORT   Patient Name:   PHINNEAS MOGA Date of Exam: 08/27/2022 Medical Rec #:  [id number]       Height:       71.0 in Accession #:    [id number]      Weight:       195.5 lb Date of Birth:  20-Sep-1955      BSA:          2.088 m Patient Age:    66 years        BP:           122/90 mmHg Patient Gender: M               HR:           73 bpm. Exam Location:  Inpatient Procedure: 2D Echo, Cardiac Doppler and Color Doppler Indications:    Stroke  History:        Patient has no prior history of Echocardiogram examinations.                 Risk Factors:Hypertension.  Sonographer:    Darlys Gales Referring Phys: [id number] DEVON SHAFER IMPRESSIONS  1. Left ventricular ejection fraction, by estimation, is 50 to 55%. The left ventricle has low normal function. The  left ventricle has no regional wall motion abnormalities. Left ventricular diastolic parameters are consistent with Grade I diastolic dysfunction (impaired relaxation).  2. Right ventricular systolic function is normal. The right ventricular size is normal.  3. The mitral valve is normal  in structure. No evidence of mitral valve regurgitation. No evidence of mitral stenosis.  4. The aortic valve is normal in structure. Aortic valve regurgitation is not visualized. No aortic stenosis is present.  5. The inferior vena cava is dilated in size with <50% respiratory variability, suggesting right atrial pressure of 15 mmHg. FINDINGS  Left Ventricle: Left ventricular ejection fraction, by estimation, is 50 to 55%. The left ventricle has low normal function. The left ventricle has no regional wall motion abnormalities. The left ventricular internal cavity size was normal in size. There is no left ventricular hypertrophy. Left ventricular diastolic parameters are consistent with Grade I diastolic dysfunction (impaired relaxation). Right Ventricle: The right ventricular size is normal. No increase in right ventricular wall thickness. Right ventricular systolic function is normal. Left Atrium: Left atrial size was normal in size. Right Atrium: Right atrial size was normal in size. Pericardium: There is no evidence of pericardial effusion. Mitral Valve: The mitral valve is normal in structure. No evidence of mitral valve regurgitation. No evidence of mitral valve stenosis. Tricuspid Valve: The tricuspid valve is normal in structure. Tricuspid valve regurgitation is not demonstrated. No evidence of tricuspid stenosis. Aortic Valve: The aortic valve is normal in structure. Aortic valve regurgitation is not visualized. No aortic stenosis is present. Aortic valve mean gradient measures 3.0 mmHg. Aortic valve peak gradient measures 5.4 mmHg. Aortic valve area, by VTI measures 2.63 cm. Pulmonic Valve: The pulmonic valve was normal  in structure. Pulmonic valve regurgitation is not visualized. No evidence of pulmonic stenosis. Aorta: The aortic root is normal in size and structure. Venous: The inferior vena cava is dilated in size with less than 50% respiratory variability, suggesting right atrial pressure of 15 mmHg. IAS/Shunts: No atrial level shunt detected by color flow Doppler.  LEFT VENTRICLE PLAX 2D LVIDd:         5.00 cm   Diastology LVIDs:         3.40 cm   LV e' medial:    6.85 cm/s LV PW:         1.10 cm   LV E/e' medial:  7.6 LV IVS:        1.00 cm   LV e' lateral:   6.74 cm/s LVOT diam:     2.00 cm   LV E/e' lateral: 7.7 LV SV:         58 LV SV Index:   28 LVOT Area:     3.14 cm  RIGHT VENTRICLE RV S prime:     18.80 cm/s TAPSE (M-mode): 3.2 cm LEFT ATRIUM             Index        RIGHT ATRIUM           Index LA Vol (A2C):   35.9 ml 17.19 ml/m  RA Area:     14.30 cm LA Vol (A4C):   26.4 ml 12.64 ml/m  RA Volume:   38.30 ml  18.34 ml/m LA Biplane Vol: 31.1 ml 14.89 ml/m  AORTIC VALVE AV Area (Vmax):    2.71 cm AV Area (Vmean):   2.53 cm AV Area (VTI):     2.63 cm AV Vmax:           116.00 cm/s AV Vmean:          85.900 cm/s AV VTI:            0.220 m AV Peak Grad:      5.4 mmHg AV Mean  Grad:      3.0 mmHg LVOT Vmax:         99.90 cm/s LVOT Vmean:        69.100 cm/s LVOT VTI:          0.184 m LVOT/AV VTI ratio: 0.84  AORTA Ao Root diam: 3.40 cm MITRAL VALVE MV Area (PHT): 3.40 cm    SHUNTS MV Decel Time: 223 msec    Systemic VTI:  0.18 m MV E velocity: 51.80 cm/s  Systemic Diam: 2.00 cm MV A velocity: 58.30 cm/s MV E/A ratio:  0.89 Aditya Sabharwal Electronically signed by Dorthula Nettles Signature Date/Time: 08/27/2022/11:51:37 AM    Final     PHYSICAL EXAM Constitutional: Critically ill-appearing African-American male laying in ICU bed, in no acute distress Psych: Unable to assess due to patient's condition, critically ill intubated in the ICU Eyes: No scleral injection HENT: ETT in place with securement device, EVD  at the right superior scalp MSK: no joint deformities or swelling Cardiovascular: Normal rate and regular rhythm.  Respiratory: Respirations assisted via mechanical ventilation, ETT in place, spontaneous respirations present over set vent rate GI: Soft.  No distension. There is no tenderness.  Skin: WDI  Neuro: Mental Status: Patient is intubated on fentanyl drip in the ICU.  Eyes are closed.  Opens eyes partially to sternal rub. Patient does not follow commands.  Patient does not attempt to communicate with examiner. Patient opens eyes to loud voice. Cranial Nerves: II: Does not blink to threat bilaterally PERRL 2 mm and sluggish reactive III,IV, VI: Bilateral eyes with primary position.  Few beats of nystagmus.  V: Corneals intact VII: Facial symmetry is obscured due to ETT and securement device VIII: Hearing is intact to voice IX/X: Cough and gag intact XI: Head is XII: Does not protrude tongue to command Motor/Sensory: Does not follow commands.  Patient spontaneously moves left lower and upper extremity.  With application of noxious stimuli, patient withdrawals to left upper extremity.  Patient is flaccid on the right upper and lower extremity. Cerebellar: Does not perform  ASSESSMENT/PLAN [name] is a 67 y.o. 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  Acute left thalamic parenchymal hematoma with IVH and associated communicating hydrocephalus s/p ventriculostomy placement 08/26/22. Etiology: likely hypertensive  CT Head: acute parenchymal hematoma  centered in the left thalamus with IVH and associated communicating hydrocephalus  Repeat [facility] 08/27/22:  Intra-axial component of the left thalamic hemorrhage appears stable in size and configuration New right superior frontal approach external ventricular drain places, terminates in the right lateral ventricle Ventricular system may be slightly smaller but otherwise no significant improvement moderate to large intraventricular volume of blood, and small volume subarachnoid blood in the posterior fossa which probably extended from the 4th ventricle Mild new rightward midline shift, 2-3 mm No other new intracranial abnormality  CTA head & neck scheduled for tomorrow morning 08/27/20 2D Echo LVEF 50-55%.  LDL 101 HgbA1c 5.6% EEG overnight is suggestive of severe diffuse encephalopathy of nonspecific etiology though likely related to sedation.  No seizures or epileptiform discharges seen throughout recording. Continue EEG monitoring VTE prophylaxis - SCDs in the setting of  ICH     Diet   Diet NPO time specified   No antithrombotic prior to admission, now on No antithrombotic due to IPH Therapy recommendations:  pending Disposition:  pending  Communicating hydrocephalus s/p EVD placement Hypertonic saline 3% HTS 3% s/p 250 mL bolus, 75 cc/h gtt  Na 148 > 152 overnight  Continue at current rate Goal Na 150-155 If Na > 160, stop infusion and notify provider Na checks  Hypertension requiring cleviprex gtt Home meds:  losartan, diltiazem Unstable Continues on cleviprex gtt Add home losartan  PRN labetalol and hydralazine for hypertension  Blood pressure goal systolic 130-150 for 24 hours then liberalize blood pressure goal to SBP < Permissive hypertension (OK if < 220/120) but gradually normalize in 5-7 days Long-term BP goal normotensive  Hyperlipidemia Home meds: None LDL 101, goal < 70 Consider adding statin at discharge  Prediabetes On home Metformin, held on  admission A1c pending CBGs: 138, 111 SSI as needed  Other Stroke Risk Factors Substance abuse - UDS:  THC POSITIVE, Cocaine NONE DETECTED. Patient advised to stop using due to stroke risk.  Other Active Problems Schizoaffective disorder, depressive type On Zyprexa Glaucoma  Hospital day # 2  Patient presented with sudden onset of unresponsiveness and was intubated upon arrival to the ED and found to be slightly hypertensive.  CT head however showed a large left thalamic parenchymal hemorrhage with intraventricular extension and hydrocephalus.  Patient had ventriculostomy placed and follow-up CT scan showed stable appearance of the with slight decrease in ventricular size hematoma.  He remains sedated and intubated and on ventilatory support for respiratory failure.  Continue close neurological monitoring and strict blood pressure control with systolic goal  below 160.  Use as needed IV labetalol and hydralazine and dc Cleviprex drip.  Continue induced hyponatremia with serum sodium goal 150-155.  Trial of extubation in the next few days when patient is more alert and able to protect his airway.  Discussed with Dr. Craige Cotta critical care medicine.  No family at the bedside today This patient is critically ill and at significant risk of neurological worsening, death and care requires constant monitoring of vital signs, hemodynamics,respiratory and cardiac monitoring, extensive review of multiple databases, frequent neurological assessment, discussion with family, other specialists and medical decision making of high complexity.I have made any additions or clarifications directly to the above note.This critical care time does not reflect procedure time, or teaching time or supervisory time of PA/NP/Med Resident etc but could involve care discussion time.  I spent 30 minutes of neurocritical care time  in the care of  this patient.        Delia Heady, MD Medical Director [facility] Stroke  Center Pager: [phone] 08/28/2022 11:23 AM  To contact Stroke Continuity provider, please refer to [email or web address]. After hours, contact General Neurology

## 2022-08-28 NOTE — Progress Notes (Signed)
Patient ID: Jimmy Marshall, male   DOB: 06-Dec-1955, 67 y.o.   MRN: [id number] Blinking eyes mvmts, purposeful on L, ventric patent. following

## 2022-08-28 NOTE — Progress Notes (Signed)
NAME:  Jimmy Marshall, MRN:  [id number], DOB:  1955-05-24, LOS: 2 ADMISSION DATE:  08/26/2022 CONSULTATION DATE:  08/26/2022 REFERRING MD:  Kommor - EDP CHIEF COMPLAINT:  AMS, ICH   History of Present Illness:  67 year old man who presented to [facility] ED 5/22 after being found down at a hotel.  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  Patient is intubated and sedated, therefore history is obtained from chart review.  Per EMS, patient is currently residing in a hotel and was found down by staff who went to collect rent from patient.  Found unresponsive, RR 4 and Narcan 1mg  (IN) and 3mg  IV given with no response.  Intubated on arrival to ED. CXR notable for left greater than right atelectasis.  Per EDP, BP very labile; hypertensive on admission then dropping BP to SBP 70s requiring Neo pushes.  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  Neurosurgery was consulted.  CTA Chest/A/P negative for aortic dissection, overall unchanged enlargement of distal aortic arch/ascending thoracic aorta.  PCCM consulted for ICU admission.  Pertinent Medical History:   Past Medical History:  Diagnosis Date   GERD (gastroesophageal reflux disease)    Glaucoma    Hypertension    Schizoaffective disorder, depressive type (HCC)    "dx'd in the 1990's"   Schizophrenia, schizo-affective (HCC)    Significant Hospital Events: Including procedures, antibiotic start and stop dates in addition to other pertinent events   5/22 -presented via EMS after being found down at hotel.  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 Neuro/Stroke consulted. Neurosurgery consulted. EVD placement. 5/24 3%NS held for elevated Na and Cl, CT imaging shows non-worsening hematoma  Interim  History / Subjective:  PCCM consulted for ICU admission  Objective:  Blood pressure 130/86, pulse 61, temperature 98.4 F (36.9 C), temperature source Bladder, resp. rate 18, height 5\' 11"  (1.803 m), weight 92.8 kg, SpO2 99 %.    Vent Mode: PRVC FiO2 (%):  [30 %-50 %] 40 % Set Rate:  [18 bmp] 18 bmp Vt Set:  [600 mL] 600 mL PEEP:  [5 cmH20] 5 cmH20 Plateau Pressure:  [16 cmH20-17 cmH20] 16 cmH20   Intake/Output Summary (Last 24 hours) at 08/28/2022 1142 Last data filed at 08/28/2022 1100 Gross per 24 hour  Intake 4816.27 ml  Output 2019 ml  Net 2797.27 ml   Filed Weights   08/26/22 1305 08/27/22 0500 08/28/22 0500  Weight: 95.3 kg 88.7 kg 92.8 kg   Physical Examination: General: Acutely ill-appearing middle-aged man Intubated, sedated. HEENT: [place]/AT, anicteric sclera, pupils equal round 2mm, reactive, moist mucous membranes. horizonatal nystagmus, ETT in place. Neuro: Sedated.  Will follow comands when asked to move left hand and foot only. + Corneal reflex. RUE and RLE flacid,  CV: RRR, no m/g/r. PULM: Breathing even and unlabored on vent. Lung fields CTAB in upper fields. GI: Soft, nontender, nondistended. Extremities: Trace bilateral symmetric LE edema noted. Skin: Warm/dry, no rashes.  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 -Continue current treatment until intracranial processes allow for progression to extubation - NSGY following for EVD - Neuro/Stroke following - Goal SBP 120-160, per neuro - Cleviprex titrated to goal SBP - S/p Keppra load,  AEDs per Neuro - HTS 3% held, per Neuro - Frequent neuro checks - Neuroprotective measures: HOB > 30 degrees, normoglycemia, normothermia, electrolytes WNL - PT/OT/SLP when able to participate in care  Altered mental status  with acute encephalopathy, likely related to IPH + toxic metabolic - Neuro workup as above - F/u UDS, LA - Correct metabolic derangements as able - PAD protocol  Acute hypoxemic respiratory failure Possible aspiration event - Continue full vent support (4-8cc/kg IBW) - Wean FiO2 for O2 sat > 90% - Daily WUA/SBT - VAP bundle - Pulmonary hygiene - PAD protocol for sedation: Propofol and Fentanyl for goal RASS 0 to -1 - Follow CXR, ABG - Resp Cx  Thoracic aortic intramural hematoma and descending thoracic aortic aneurysm HTN Noted on scan 08/2021 4.6 cm. - BP control as above with Cleviprex - SBP goal per above - Resume home medications as appropriate  Schizoaffective disorder/schizophrenia - Resume home Zyprexa, Remeron as clinically appropriate  GERD - PPI  At risk for hyperglycemia - Hold home metformin - SSI - CBGs Q4H - Goal CBG 140-180  Best Practice: (right click and "Reselect all SmartList Selections" daily)   Diet/type: NPO DVT prophylaxis: SCDs GI prophylaxis: PPI Lines: Arterial Line Foley:  Yes, and it is still needed Code Status:  full code Last date of multidisciplinary goals of care discussion [5/22 - Per patient's brother, Harvie Heck, to his knowledge patient would want all interventions.]  Labs:  CBC: Recent Labs  Lab 08/26/22 1420 08/26/22 1614 08/27/22 0208 08/27/22 0234 08/28/22 0155  WBC 11.7*  --  9.6  --  9.5  NEUTROABS 10.8*  --   --   --   --   HGB 12.7* 12.9* 11.4* 11.9* 10.2*  HCT 41.8 38.0* 35.8* 35.0* 32.5*  MCV 89.9  --  85.6  --  87.1  PLT 195  --  220  --  178   Basic Metabolic Panel: Recent Labs  Lab 08/26/22 1317 08/26/22 1408 08/26/22 1420 08/26/22 1614 08/26/22 1933 08/27/22 0208 08/27/22 0234 08/27/22 0737 08/27/22 1417 08/27/22 1441 08/27/22 2021 08/28/22 0155 08/28/22 0808  NA 143   < > 141  140 148*   < > 148* 152* 152* 151*  --  153* 156* 158*  K 5.1   < > 3.4* 3.7  --  3.5 3.4*  --   --   --   --  3.6  --    CL 107  --  106  --   --  119*  --   --   --   --   --  130*  --   CO2  --   --  18*  --   --  20*  --   --   --   --   --  20*  --   GLUCOSE 180*  --  138*  --   --  111*  --   --   --   --   --  104*  --   BUN 26*  --  18  --   --  14  --   --   --   --   --  16  --   CREATININE 1.10  --  1.28*  --   --  1.08  --   --   --   --   --  0.96  --   CALCIUM  --   --  8.9  --   --  8.6*  --   --   --   --   --  8.0*  --   MG  --   --   --   --   --  2.1  --   --   --  2.2  --  2.3  --   PHOS  --   --   --   --   --  1.4*  --   --   --  2.5  --  2.1*  --    < > = values in this interval not displayed.   GFR: Estimated Creatinine Clearance: 90.5 mL/min (by C-G formula based on SCr of 0.96 mg/dL). Recent Labs  Lab 08/26/22 1311 08/26/22 1420 08/26/22 1421 08/26/22 1510 08/27/22 0208 08/28/22 0155  PROCALCITON  --   --   --  0.18  --   --   WBC  --  11.7*  --   --  9.6 9.5  LATICACIDVEN 4.3*  --  4.4*  --   --   --     Liver Function Tests: Recent Labs  Lab 08/26/22 1420  AST 24  ALT 18  ALKPHOS 52  BILITOT 0.8  PROT 7.0  ALBUMIN 3.9   No results for input(s): "LIPASE", "AMYLASE" in the last 168 hours. No results for input(s): "AMMONIA" in the last 168 hours.  ABG:    Component Value Date/Time   PHART 7.459 (H) 08/27/2022 0234   PCO2ART 25.1 (L) 08/27/2022 0234   PO2ART 124 (H) 08/27/2022 0234   HCO3 17.7 (L) 08/27/2022 0234   TCO2 18 (L) 08/27/2022 0234   ACIDBASEDEF 4.0 (H) 08/27/2022 0234   O2SAT 99 08/27/2022 0234    Coagulation Profile: No results for input(s): "INR", "PROTIME" in the last 168 hours.  Cardiac Enzymes: No results for input(s): "CKTOTAL", "CKMB", "CKMBINDEX", "TROPONINI" in the last 168 hours.  HbA1C: Hgb A1c MFr Bld  Date/Time Value Ref Range Status  08/27/2022 02:08 AM 6.0 (H) 4.8 - 5.6 % Final    Comment:    (NOTE)         Prediabetes: 5.7 - 6.4         Diabetes: >6.4         Glycemic control for adults with diabetes: <7.0    08/26/2022 04:06 PM 5.6 4.8 - 5.6 % Final    Comment:    (NOTE) Pre diabetes:          5.7%-6.4%  Diabetes:              >6.4%  Glycemic control for   <7.0% adults with diabetes    CBG: Recent Labs  Lab 08/27/22 1913 08/27/22 2304 08/28/22 0316 08/28/22 0752 08/28/22 1116  GLUCAP 140* 92 109* 103* 127*    Review of Systems:   Patient is encephalopathic and/or intubated; therefore, history has been obtained from chart review.   Past Medical History:  He,  has a past medical history of GERD (gastroesophageal reflux disease), Glaucoma, Hypertension, Schizoaffective disorder, depressive type (HCC), and Schizophrenia, schizo-affective (HCC).   Surgical History:   Past Surgical History:  Procedure Laterality Date   FRACTURE SURGERY  1996   jaw    INCISION AND DRAINAGE / EXCISION THYROGLOSSAL CYST  06/30/11   w/central hyoid resection   THYROGLOSSAL DUCT CYST  06/30/2011   Procedure: THYROGLOSSAL DUCT CYST;  Surgeon: Osborn Coho, MD;  Location: [facility] OR;  Service: ENT;  Laterality: N/A;  excision of thyroglossal duct cyst   Social History:   reports that he has never smoked. He has never used smokeless tobacco. He  reports current alcohol use. He reports current drug use. Drugs: Marijuana and Cocaine.   Family History:  His family history includes Cancer in his father; Heart attack in his mother; Hypertension in his mother.   Allergies: No Known Allergies   Home Medications: Prior to Admission medications   Medication Sig Start Date End Date Taking? Authorizing Provider  diltiazem (CARTIA XT) 240 MG 24 hr capsule Take 1 capsule (240 mg total) by mouth every morning. 08/10/22 09/09/22  Tolia, Sunit, DO  losartan (COZAAR) 100 MG tablet Take 1 tablet (100 mg total) by mouth daily at 10 pm. 08/10/22 09/09/22  Tolia, Sunit, DO  metFORMIN (GLUCOPHAGE) 500 MG tablet Take 500 mg by mouth 2 (two) times daily with a meal.    [provider]  mirtazapine (REMERON) 15 MG tablet  Take 15 mg by mouth at bedtime. 02/27/20   [provider]  OLANZapine (ZYPREXA) 10 MG tablet Take 10 mg by mouth at bedtime.    [provider]  OLANZapine (ZYPREXA) 20 MG tablet Take 20 mg by mouth at bedtime.    [provider]  spironolactone (ALDACTONE) 25 MG tablet Take 1 tablet (25 mg total) by mouth every morning. 08/10/22 09/09/22  Tessa Lerner, DO   Critical care time:    The patient is critically ill with multiple organ system failure and requires high complexity decision making for assessment and support, frequent evaluation and titration of therapies, advanced monitoring, review of radiographic studies and interpretation of complex data.   Critical Care Time devoted to patient care services, exclusive of separately billable procedures, described in this note is 41 minutes.  Steward Drone [place], Medical Student 08/28/22 11:42 AM   From 7A-7P if no response, please call [phone] After hours, please call ELink [phone]

## 2022-08-28 NOTE — Progress Notes (Signed)
PT Cancellation Note  Patient Details Name: Jimmy Marshall MRN: [id number] DOB: January 31, 1956   Cancelled Treatment:    Reason Eval/Treat Not Completed: Active bedrest order. Pt remains on bed rest, continues with EVD and on vent with no command follow per RN. In the process of weaning patient off vent. Acute PT to return as able, as appropriate, when activity orders are advanced.   Lewis Shock, PT, DPT Acute Rehabilitation Services Secure chat preferred Office #: [phone]    Iona Hansen 08/28/2022, 11:13 AM

## 2022-08-29 ENCOUNTER — Inpatient Hospital Stay (HOSPITAL_COMMUNITY): Payer: No Typology Code available for payment source

## 2022-08-29 DIAGNOSIS — I610 Nontraumatic intracerebral hemorrhage in hemisphere, subcortical: Secondary | ICD-10-CM

## 2022-08-29 DIAGNOSIS — G910 Communicating hydrocephalus: Secondary | ICD-10-CM

## 2022-08-29 DIAGNOSIS — J9601 Acute respiratory failure with hypoxia: Secondary | ICD-10-CM | POA: Diagnosis not present

## 2022-08-29 LAB — GLUCOSE, CAPILLARY
Glucose-Capillary: 108 mg/dL — ABNORMAL HIGH (ref 70–99)
Glucose-Capillary: 120 mg/dL — ABNORMAL HIGH (ref 70–99)
Glucose-Capillary: 123 mg/dL — ABNORMAL HIGH (ref 70–99)
Glucose-Capillary: 130 mg/dL — ABNORMAL HIGH (ref 70–99)
Glucose-Capillary: 130 mg/dL — ABNORMAL HIGH (ref 70–99)
Glucose-Capillary: 137 mg/dL — ABNORMAL HIGH (ref 70–99)
Glucose-Capillary: 143 mg/dL — ABNORMAL HIGH (ref 70–99)

## 2022-08-29 LAB — CBC
HCT: 34.9 % — ABNORMAL LOW (ref 39.0–52.0)
Hemoglobin: 10.5 g/dL — ABNORMAL LOW (ref 13.0–17.0)
MCH: 27.6 pg (ref 26.0–34.0)
MCHC: 30.1 g/dL (ref 30.0–36.0)
MCV: 91.8 fL (ref 80.0–100.0)
Platelets: 171 10*3/uL (ref 150–400)
RBC: 3.8 MIL/uL — ABNORMAL LOW (ref 4.22–5.81)
RDW: 19.4 % — ABNORMAL HIGH (ref 11.5–15.5)
WBC: 6 10*3/uL (ref 4.0–10.5)
nRBC: 0 % (ref 0.0–0.2)

## 2022-08-29 LAB — URINALYSIS, COMPLETE (UACMP) WITH MICROSCOPIC
Bacteria, UA: NONE SEEN
Bilirubin Urine: NEGATIVE
Glucose, UA: NEGATIVE mg/dL
Hgb urine dipstick: NEGATIVE
Ketones, ur: NEGATIVE mg/dL
Leukocytes,Ua: NEGATIVE
Nitrite: NEGATIVE
Protein, ur: 100 mg/dL — AB
Specific Gravity, Urine: 1.033 — ABNORMAL HIGH (ref 1.005–1.030)
pH: 5 (ref 5.0–8.0)

## 2022-08-29 LAB — BASIC METABOLIC PANEL
Anion gap: 7 (ref 5–15)
BUN: 26 mg/dL — ABNORMAL HIGH (ref 8–23)
CO2: 21 mmol/L — ABNORMAL LOW (ref 22–32)
Calcium: 8.8 mg/dL — ABNORMAL LOW (ref 8.9–10.3)
Chloride: 129 mmol/L — ABNORMAL HIGH (ref 98–111)
Creatinine, Ser: 1.09 mg/dL (ref 0.61–1.24)
GFR, Estimated: >60 mL/min (ref >60)
Glucose, Bld: 153 mg/dL — ABNORMAL HIGH (ref 70–99)
Potassium: 3.8 mmol/L (ref 3.5–5.1)
Sodium: 157 mmol/L — ABNORMAL HIGH (ref 135–145)

## 2022-08-29 LAB — SODIUM
Sodium: 159 mmol/L — ABNORMAL HIGH (ref 135–145)
Sodium: 154 mmol/L — ABNORMAL HIGH (ref 135–145)
Sodium: 156 mmol/L — ABNORMAL HIGH (ref 135–145)

## 2022-08-29 LAB — RAPID URINE DRUG SCREEN, HOSP PERFORMED
Amphetamines: NOT DETECTED
Barbiturates: NOT DETECTED
Benzodiazepines: NOT DETECTED
Cocaine: NOT DETECTED
Opiates: NOT DETECTED
Tetrahydrocannabinol: POSITIVE — AB

## 2022-08-29 LAB — CULTURE, RESPIRATORY W GRAM STAIN

## 2022-08-29 LAB — CULTURE, BLOOD (ROUTINE X 2): Special Requests: ADEQUATE

## 2022-08-29 MED ORDER — SENNA 8.6 MG PO TABS
1.0000 | ORAL_TABLET | Freq: Once | ORAL | Status: AC
  Administered 2022-08-29: 8.6 mg
  Filled 2022-08-29: qty 1

## 2022-08-29 MED ORDER — FREE WATER
200.0000 mL | Status: DC
  Administered 2022-08-29 (×6): 200 mL

## 2022-08-29 MED ORDER — SODIUM CHLORIDE 3 % IN NEBU
4.0000 mL | INHALATION_SOLUTION | Freq: Two times a day (BID) | RESPIRATORY_TRACT | Status: AC
  Administered 2022-08-29 – 2022-08-31 (×6): 4 mL via RESPIRATORY_TRACT
  Filled 2022-08-29 (×6): qty 4

## 2022-08-29 MED ORDER — CLEVIDIPINE BUTYRATE 0.5 MG/ML IV EMUL
0.0000 mg/h | INTRAVENOUS | Status: DC
  Administered 2022-08-29 (×2): 8 mg/h via INTRAVENOUS
  Administered 2022-08-29: 2 mg/h via INTRAVENOUS
  Administered 2022-08-30: 10 mg/h via INTRAVENOUS
  Administered 2022-08-30: 6 mg/h via INTRAVENOUS
  Administered 2022-08-30: 12 mg/h via INTRAVENOUS
  Administered 2022-08-31: 15 mg/h via INTRAVENOUS
  Administered 2022-08-31: 2 mg/h via INTRAVENOUS
  Administered 2022-09-01: 21 mg/h via INTRAVENOUS
  Administered 2022-09-01: 3 mg/h via INTRAVENOUS
  Administered 2022-09-01 (×3): 21 mg/h via INTRAVENOUS
  Administered 2022-09-01: 18 mg/h via INTRAVENOUS
  Administered 2022-09-01 (×2): 21 mg/h via INTRAVENOUS
  Administered 2022-09-01: 16 mg/h via INTRAVENOUS
  Administered 2022-09-02: 19 mg/h via INTRAVENOUS
  Administered 2022-09-02: 15 mg/h via INTRAVENOUS
  Administered 2022-09-02: 17 mg/h via INTRAVENOUS
  Administered 2022-09-02: 21 mg/h via INTRAVENOUS
  Administered 2022-09-02: 15 mg/h via INTRAVENOUS
  Administered 2022-09-02: 17 mg/h via INTRAVENOUS
  Administered 2022-09-02: 21 mg/h via INTRAVENOUS
  Administered 2022-09-03: 9 mg/h via INTRAVENOUS
  Administered 2022-09-03: 21 mg/h via INTRAVENOUS
  Administered 2022-09-03: 12 mg/h via INTRAVENOUS
  Administered 2022-09-03 (×2): 21 mg/h via INTRAVENOUS
  Administered 2022-09-03: 10 mg/h via INTRAVENOUS
  Administered 2022-09-04: 16 mg/h via INTRAVENOUS
  Administered 2022-09-04: 15 mg/h via INTRAVENOUS
  Administered 2022-09-04: 19 mg/h via INTRAVENOUS
  Administered 2022-09-04: 14 mg/h via INTRAVENOUS
  Administered 2022-09-04: 10 mg/h via INTRAVENOUS
  Administered 2022-09-04: 17 mg/h via INTRAVENOUS
  Filled 2022-08-29: qty 100
  Filled 2022-08-29: qty 50
  Filled 2022-08-29: qty 100
  Filled 2022-08-29: qty 50
  Filled 2022-08-29 (×2): qty 100
  Filled 2022-08-29: qty 50
  Filled 2022-08-29 (×3): qty 100
  Filled 2022-08-29: qty 50
  Filled 2022-08-29: qty 100
  Filled 2022-08-29: qty 50
  Filled 2022-08-29: qty 100
  Filled 2022-08-29 (×2): qty 50
  Filled 2022-08-29 (×4): qty 100
  Filled 2022-08-29 (×2): qty 50
  Filled 2022-08-29 (×6): qty 100
  Filled 2022-08-29: qty 50
  Filled 2022-08-29: qty 100
  Filled 2022-08-29 (×2): qty 50
  Filled 2022-08-29 (×2): qty 100

## 2022-08-29 MED ORDER — SENNOSIDES-DOCUSATE SODIUM 8.6-50 MG PO TABS
1.0000 | ORAL_TABLET | Freq: Two times a day (BID) | ORAL | Status: DC
  Administered 2022-08-29 – 2022-09-01 (×3): 1
  Filled 2022-08-29 (×3): qty 1

## 2022-08-29 NOTE — Progress Notes (Signed)
STROKE TEAM PROGRESS NOTE   INTERVAL HISTORY Patient seen and evaluated this morning. Patient remains intubated and sedated.  He opens eyes to stimulation and move upper extremities but does not follow commands.  He is purposeful movements on the left side but remains hemiplegic on the right.  Eyes are today in primary position.  Serum sodium is above goal at 157 this morning and hypertonic saline drip has been placed on hold.  Blood pressure adequately controlled.   Vitals:   08/29/22 1300 08/29/22 1400 08/29/22 1447 08/29/22 1500  BP: 130/71 132/73  133/76  Pulse: 82 82 85 87  Resp: 15 17 19 18   Temp: 99.3 F (37.4 C) 99.7 F (37.6 C) 100 F (37.8 C) 100 F (37.8 C)  TempSrc:      SpO2: 95% 97% 96% 96%  Weight:      Height:       CBC:  Recent Labs  Lab 08/26/22 1420 08/26/22 1614 08/28/22 0155 08/29/22 0439  WBC 11.7*   < > 9.5 6.0  NEUTROABS 10.8*  --   --   --   HGB 12.7*   < > 10.2* 10.5*  HCT 41.8   < > 32.5* 34.9*  MCV 89.9   < > 87.1 91.8  PLT 195   < > 178 171   < > = values in this interval not displayed.   Basic Metabolic Panel:  Recent Labs  Lab 08/28/22 0155 08/28/22 0808 08/28/22 1644 08/28/22 1935 08/29/22 0439 08/29/22 0815  NA 156*   < >  --    < > 157* 159*  K 3.6  --   --   --  3.8  --   CL 130*  --   --   --  129*  --   CO2 20*  --   --   --  21*  --   GLUCOSE 104*  --   --   --  153*  --   BUN 16  --   --   --  26*  --   CREATININE 0.96  --   --   --  1.09  --   CALCIUM 8.0*  --   --   --  8.8*  --   MG 2.3  --  2.7*  --   --   --   PHOS 2.1*  --  2.6  --   --   --    < > = values in this interval not displayed.   Lipid Panel:  Recent Labs  Lab 08/27/22 0208  CHOL 155  TRIG 105  114  HDL 33*  CHOLHDL 4.7  VLDL 21  LDLCALC 161*   HgbA1c:  Recent Labs  Lab 08/27/22 0208  HGBA1C 6.0*   Urine Drug Screen:  Recent Labs  Lab 08/29/22 1018  LABOPIA NONE DETECTED  COCAINSCRNUR NONE DETECTED  LABBENZ NONE DETECTED  AMPHETMU  NONE DETECTED  THCU POSITIVE*  LABBARB NONE DETECTED    Alcohol Level No results for input(s): "ETH" in the last 168 hours.  IMAGING past 24 hours DG Chest Port 1 View  Result Date: 08/29/2022 CLINICAL DATA:  Respiratory failure. EXAM: PORTABLE CHEST 1 VIEW COMPARISON:  Chest radiograph 08/27/2022 FINDINGS: An endotracheal tube remains in place and terminates 3.5 cm above the carina. An enteric tube courses into the abdomen with tip not imaged. The cardiomediastinal silhouette is unchanged with aneurysmal dilatation of the aortic arch and descending thoracic aorta known by CT. Lung volumes  are low with similar appearance of streaky left greater than right basilar opacities. No large pleural effusion or pneumothorax is identified. No acute osseous abnormality is seen. IMPRESSION: Unchanged left greater than right basilar opacities, favor atelectasis. Electronically Signed   By: Sebastian Ache M.D.   On: 08/29/2022 11:39    PHYSICAL EXAM Constitutional: Critically ill-appearing African-American male laying in ICU bed, in no acute distress Psych: Unable to assess due to patient's condition, critically ill intubated in the ICU Eyes: No scleral injection HENT: ETT in place with securement device, EVD at the right superior scalp MSK: no joint deformities or swelling Cardiovascular: Normal rate and regular rhythm.  Respiratory: Respirations assisted via mechanical ventilation, ETT in place, spontaneous respirations present over set vent rate GI: Soft.  No distension. There is no tenderness.  Skin: WDI  Neuro: Mental Status: Patient is intubated on fentanyl drip in the ICU.  Eyes are closed.  Opens eyes partially to sternal rub and move upper extremities.  Patient does not follow commands.  Patient does not attempt to communicate with examiner. Patient opens eyes to loud voice. Cranial Nerves: II: Does not blink to threat bilaterally PERRL 2 mm and sluggish reactive III,IV, VI: Bilateral eyes with  primary position. V: Corneals intact VII: Facial symmetry is obscured due to ETT and securement device VIII: Hearing is intact to voice IX/X: Cough and gag intact XI: Head is XII: Does not protrude tongue to command Motor/Sensory: Does not follow commands.  Patient spontaneously moves left lower and upper extremity.  With application of noxious stimuli, patient withdrawals to left upper extremity.  Patient is flaccid on the right upper and lower extremity. Cerebellar: Did not perform  ASSESSMENT/PLAN Mr. Ryant Calderas is a 67 y.o. 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  Acute left thalamic parenchymal hematoma with IVH and associated communicating hydrocephalus s/p ventriculostomy placement 08/26/22. Etiology: likely hypertensive  CT Head: acute parenchymal hematoma centered in the left thalamus with IVH and associated communicating hydrocephalus  Repeat [facility] 08/27/22:  Intra-axial component of the left thalamic hemorrhage appears stable in size and configuration New right superior frontal approach external ventricular drain places, terminates in the right lateral ventricle Ventricular system may be slightly smaller but otherwise no significant improvement moderate to large intraventricular volume of blood, and small volume subarachnoid blood in the posterior fossa which probably extended from the 4th ventricle Mild new rightward midline shift, 2-3 mm No other new intracranial abnormality  CTA head & neck scheduled for tomorrow morning 08/27/20 2D Echo LVEF 50-55%.   LDL 101 HgbA1c 5.6% EEG suggestive of severe diffuse encephalopathy of nonspecific etiology though likely related to sedation.  No seizures or epileptiform discharges seen throughout recording. Continue EEG monitoring VTE prophylaxis - SCDs in the setting of ICH     Diet   Diet NPO time specified   No antithrombotic prior to admission, now on No antithrombotic due to IPH Therapy recommendations:  pending Disposition:  pending  Communicating hydrocephalus s/p EVD placement Hypertonic saline 3% HTS 3% s/p 250 mL bolus, 75 cc/h gtt  Na 148 > 152 overnight  Continue at current rate Goal Na 150-155-157-159 If Na >  160, stop infusion and notify provider Na checks  Hypertension requiring cleviprex gtt Home meds:  losartan, diltiazem Unstable Continues on cleviprex gtt Add home losartan  PRN labetalol and hydralazine for hypertension  Blood pressure goal systolic 130-150 for 24 hours then liberalize blood pressure goal to SBP < Permissive hypertension (OK if < 220/120) but gradually normalize in 5-7 days Long-term BP goal normotensive  Hyperlipidemia Home meds: None LDL 101, goal < 70 Consider adding statin at discharge  Prediabetes On home Metformin, held on admission A1c pending CBGs: 138, 111 SSI as needed  Other Stroke Risk Factors Substance abuse - UDS:  THC POSITIVE, Cocaine NONE DETECTED. Patient advised to stop using due to stroke risk.  Other Active Problems Schizoaffective disorder, depressive type On Zyprexa Glaucoma  Hospital day # 3  Patient presented with sudden onset of unresponsiveness and was intubated upon arrival to the ED and found to be slightly hypertensive.  CT head however showed a large left thalamic parenchymal hemorrhage with intraventricular extension and hydrocephalus.  Patient had ventriculostomy placed and follow-up CT scan showed stable appearance of the with slight decrease in ventricular size hematoma.  He remains sedated and  intubated and on ventilatory support for respiratory failure.  Continue close neurological monitoring and strict blood pressure control with systolic goal  below 160.  Use as needed IV labetalol and hydralazine and dc Cleviprex drip.  Continue induced hypernatremia with serum sodium goal 150-155.  Trial of extubation in the next few days when patient is more alert and able to protect his airway.    No family at the bedside today This patient is critically ill and at significant risk of neurological worsening, death and care requires constant monitoring of vital signs, hemodynamics,respiratory and cardiac monitoring, extensive review of multiple databases, frequent neurological assessment, discussion with family, other specialists and medical decision making of high complexity.I have made any additions or clarifications directly to the above note.This critical care time does not reflect procedure time, or teaching time or supervisory time of PA/NP/Med Resident etc but could involve care discussion time. I spent 30 minutes of neurocritical care time  in the care of  this patient.       Windell Norfolk, MD Neurology Pager: [phone] 08/29/2022 4:08 PM  To contact Stroke Continuity provider, please refer to [email or web address]. After hours, contact General Neurology

## 2022-08-29 NOTE — Progress Notes (Signed)
PT Cancellation/Discharge Note  Patient Details Name: Adisa Oriley MRN: [id number] DOB: Dec 21, 1955   Cancelled Treatment:    Reason Eval/Treat Not Completed: PT screened, no needs identified at this time, will sign off  Patient remains intubated, sedated, and with ventriculostomy. Please re-order PT when pt is appropriate for activity.   Jerolyn Center, PT Acute Rehabilitation Services  Office [phone]    Zena Amos 08/29/2022, 8:21 AM

## 2022-08-29 NOTE — Progress Notes (Signed)
NAME:  Jimmy Marshall, MRN:  [id number], DOB:  1955/07/12, LOS: 3 ADMISSION DATE:  08/26/2022 CONSULTATION DATE:  08/26/2022 REFERRING MD:  Kommor - EDP CHIEF COMPLAINT:  AMS, ICH   History of Present Illness:  67 year old man who presented to [facility] ED 5/22 after being found down at a hotel.  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  Patient is intubated and sedated, therefore history is obtained from chart review.  Per EMS, patient is currently residing in a hotel and was found down by staff who went to collect rent from patient.  Found unresponsive, RR 4 and Narcan 1mg  (IN) and 3mg  IV given with no response.  Intubated on arrival to ED. CXR notable for left greater than right atelectasis.  Per EDP, BP very labile; hypertensive on admission then dropping BP to SBP 70s requiring Neo pushes.  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  Neurosurgery was consulted.  CTA Chest/A/P negative for aortic dissection, overall unchanged enlargement of distal aortic arch/ascending thoracic aorta.  PCCM consulted for ICU admission.  Pertinent Medical History:   Past Medical History:  Diagnosis Date   GERD (gastroesophageal reflux disease)    Glaucoma    Hypertension    Schizoaffective disorder, depressive type (HCC)    "dx'd in the 1990's"   Schizophrenia, schizo-affective (HCC)    Significant Hospital Events: Including procedures, antibiotic start and stop dates in addition to other pertinent events   5/22 -presented via EMS after being found down at hotel.  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 Neuro/Stroke consulted. Neurosurgery consulted. EVD placement. 5/24 3%NS held for elevated Na and Cl, CT imaging shows non-worsening hematoma  Interim  History / Subjective:  No acute issues overnight  Minimal vent settings  Net positive 700cc overnight  Na 157 off HTS infusion  Objective:  Blood pressure 132/80, pulse (!) 54, temperature 99.3 F (37.4 C), resp. rate 18, height 5\' 11"  (1.803 m), weight 93.9 kg, SpO2 97 %.    Vent Mode: PRVC FiO2 (%):  [40 %-50 %] 40 % Set Rate:  [18 bmp] 18 bmp Vt Set:  [600 mL] 600 mL PEEP:  [5 cmH20] 5 cmH20 Plateau Pressure:  [11 cmH20-16 cmH20] 11 cmH20   Intake/Output Summary (Last 24 hours) at 08/29/2022 4098 Last data filed at 08/29/2022 0700 Gross per 24 hour  Intake 1749.67 ml  Output 1041 ml  Net 708.67 ml   Filed Weights   08/27/22 0500 08/28/22 0500 08/29/22 0426  Weight: 88.7 kg 92.8 kg 93.9 kg   Physical Examination: Intubated, RASS -1  Mech breath sounds bl Moving LUE, LLE nonpurposefully Pupils equal sluggish   CXR LLL atelectasis as before  Assessment & Plan:  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 - NSGY following for EVD - Neuro/Stroke following - pain control and cleviprex SBP 120-160 per neuro - S/p Keppra load, AEDs per Neuro - HTS 3% held and continue FWF for now, per Neuro, goal 150-155 - Frequent neuro checks - Neuroprotective measures: HOB > 30 degrees, normoglycemia, normothermia, electrolytes WNL - PT/OT/SLP when able to participate in care  Acute hypoxemic respiratory failure Possible aspiration event - Continue full vent support, daily SAT/SBT as able  - Wean FiO2 for O2 sat > 90% - VAP bundle - Pulmonary hygiene - wean fentanyl, propofol for rass 0 to -1 -  Resp Cx  Thoracic aortic intramural hematoma and descending thoracic aortic aneurysm HTN Noted on scan 08/2021 4.6 cm. - SBP goal per above - Resume home medications as appropriate  Schizoaffective disorder/schizophrenia - Resume home Zyprexa, Remeron as clinically appropriate  GERD - PPI  At risk for hyperglycemia - Hold home metformin - SSI - CBGs Q4H - Goal  CBG 140-180  Best Practice: (right click and "Reselect all SmartList Selections" daily)   Diet/type: tubefeeds DVT prophylaxis: LMWH GI prophylaxis: PPI Lines: Arterial Line Foley:  Yes, and it is still needed Code Status:  full code Last date of multidisciplinary goals of care discussion 5/24 pt's daughter updated at bedside  Labs:  CBC: Recent Labs  Lab 08/26/22 1420 08/26/22 1614 08/27/22 0208 08/27/22 0234 08/28/22 0155 08/29/22 0439  WBC 11.7*  --  9.6  --  9.5 6.0  NEUTROABS 10.8*  --   --   --   --   --   HGB 12.7* 12.9* 11.4* 11.9* 10.2* 10.5*  HCT 41.8 38.0* 35.8* 35.0* 32.5* 34.9*  MCV 89.9  --  85.6  --  87.1 91.8  PLT 195  --  220  --  178 171   Basic Metabolic Panel: Recent Labs  Lab 08/26/22 1317 08/26/22 1408 08/26/22 1420 08/26/22 1614 08/26/22 1933 08/27/22 0208 08/27/22 0234 08/27/22 0737 08/27/22 1441 08/27/22 2021 08/28/22 0155 08/28/22 0808 08/28/22 1418 08/28/22 1644 08/28/22 1935 08/29/22 0439  NA 143   < > 141  140 148*   < > 148* 152*   < >  --    < > 156* 158* 160*  --  159* 157*  K 5.1   < > 3.4* 3.7  --  3.5 3.4*  --   --   --  3.6  --   --   --   --  3.8  CL 107  --  106  --   --  119*  --   --   --   --  130*  --   --   --   --  129*  CO2  --   --  18*  --   --  20*  --   --   --   --  20*  --   --   --   --  21*  GLUCOSE 180*  --  138*  --   --  111*  --   --   --   --  104*  --   --   --   --  153*  BUN 26*  --  18  --   --  14  --   --   --   --  16  --   --   --   --  26*  CREATININE 1.10  --  1.28*  --   --  1.08  --   --   --   --  0.96  --   --   --   --  1.09  CALCIUM  --   --  8.9  --   --  8.6*  --   --   --   --  8.0*  --   --   --   --  8.8*  MG  --   --   --   --   --  2.1  --   --  2.2  --  2.3  --   --  2.7*  --   --  PHOS  --   --   --   --   --  1.4*  --   --  2.5  --  2.1*  --   --  2.6  --   --    < > = values in this interval not displayed.   GFR: Estimated Creatinine Clearance: 80.1 mL/min (by C-G  formula based on SCr of 1.09 mg/dL). Recent Labs  Lab 08/26/22 1311 08/26/22 1420 08/26/22 1421 08/26/22 1510 08/27/22 0208 08/28/22 0155 08/29/22 0439  PROCALCITON  --   --   --  0.18  --   --   --   WBC  --  11.7*  --   --  9.6 9.5 6.0  LATICACIDVEN 4.3*  --  4.4*  --   --   --   --     Liver Function Tests: Recent Labs  Lab 08/26/22 1420  AST 24  ALT 18  ALKPHOS 52  BILITOT 0.8  PROT 7.0  ALBUMIN 3.9   No results for input(s): "LIPASE", "AMYLASE" in the last 168 hours. No results for input(s): "AMMONIA" in the last 168 hours.  ABG:    Component Value Date/Time   PHART 7.459 (H) 08/27/2022 0234   PCO2ART 25.1 (L) 08/27/2022 0234   PO2ART 124 (H) 08/27/2022 0234   HCO3 17.7 (L) 08/27/2022 0234   TCO2 18 (L) 08/27/2022 0234   ACIDBASEDEF 4.0 (H) 08/27/2022 0234   O2SAT 99 08/27/2022 0234    Coagulation Profile: No results for input(s): "INR", "PROTIME" in the last 168 hours.  Cardiac Enzymes: No results for input(s): "CKTOTAL", "CKMB", "CKMBINDEX", "TROPONINI" in the last 168 hours.  HbA1C: Hgb A1c MFr Bld  Date/Time Value Ref Range Status  08/27/2022 02:08 AM 6.0 (H) 4.8 - 5.6 % Final    Comment:    (NOTE)         Prediabetes: 5.7 - 6.4         Diabetes: >6.4         Glycemic control for adults with diabetes: <7.0   08/26/2022 04:06 PM 5.6 4.8 - 5.6 % Final    Comment:    (NOTE) Pre diabetes:          5.7%-6.4%  Diabetes:              >6.4%  Glycemic control for   <7.0% adults with diabetes    CBG: Recent Labs  Lab 08/28/22 1116 08/28/22 1542 08/28/22 1929 08/29/22 0003 08/29/22 0336  GLUCAP 127* 117* 134* 120* 108*    Review of Systems:   Patient is encephalopathic and/or intubated; therefore, history has been obtained from chart review.   Past Medical History:  He,  has a past medical history of GERD (gastroesophageal reflux disease), Glaucoma, Hypertension, Schizoaffective disorder, depressive type (HCC), and Schizophrenia,  schizo-affective (HCC).   Surgical History:   Past Surgical History:  Procedure Laterality Date   FRACTURE SURGERY  1996   jaw    INCISION AND DRAINAGE / EXCISION THYROGLOSSAL CYST  06/30/11   w/central hyoid resection   THYROGLOSSAL DUCT CYST  06/30/2011   Procedure: THYROGLOSSAL DUCT CYST;  Surgeon: Osborn Coho, MD;  Location: [facility] OR;  Service: ENT;  Laterality: N/A;  excision of thyroglossal duct cyst   Social History:   reports that he has never smoked. He has never used smokeless tobacco. He reports current alcohol use. He reports current drug use. Drugs: Marijuana and Cocaine.   Family History:  His family history includes Cancer  in his father; Heart attack in his mother; Hypertension in his mother.   Allergies: No Known Allergies   Home Medications: Prior to Admission medications   Medication Sig Start Date End Date Taking? Authorizing Provider  diltiazem (CARTIA XT) 240 MG 24 hr capsule Take 1 capsule (240 mg total) by mouth every morning. 08/10/22 09/09/22  Tolia, Sunit, DO  losartan (COZAAR) 100 MG tablet Take 1 tablet (100 mg total) by mouth daily at 10 pm. 08/10/22 09/09/22  Tolia, Sunit, DO  metFORMIN (GLUCOPHAGE) 500 MG tablet Take 500 mg by mouth 2 (two) times daily with a meal.    [provider]  mirtazapine (REMERON) 15 MG tablet Take 15 mg by mouth at bedtime. 02/27/20   [provider]  OLANZapine (ZYPREXA) 10 MG tablet Take 10 mg by mouth at bedtime.    [provider]  OLANZapine (ZYPREXA) 20 MG tablet Take 20 mg by mouth at bedtime.    [provider]  spironolactone (ALDACTONE) 25 MG tablet Take 1 tablet (25 mg total) by mouth every morning. 08/10/22 09/09/22  Tessa Lerner, DO   Critical care time:    The patient is critically ill with multiple organ system failure and requires high complexity decision making for assessment and support, frequent evaluation and titration of therapies, advanced monitoring, review of radiographic  studies and interpretation of complex data.   Critical Care Time devoted to patient care services, exclusive of separately billable procedures, described in this note is 35 minutes.  Omar Person, MD 08/29/22 7:21 AM   From 7A-7P if no response, please call [phone] After hours, please call ELink [phone]

## 2022-08-29 NOTE — Progress Notes (Signed)
OT Cancellation Note  Patient Details Name: Cordarryl Ohm MRN: [id number] DOB: November 16, 1955   Cancelled Treatment:    Reason Eval/Treat Not Completed: Active bedrest order;Other (comment) (Patient is currently intubated. OT will follow back when patient is medically appropriate for evaluation.)  Cherlyn Cushing 08/29/2022, 8:18 AM

## 2022-08-29 NOTE — Progress Notes (Signed)
Subjective: The patient is somnolent but easily arousable.  He is in no apparent distress.  Objective: Vital signs in last 24 hours: Temp:  [98.6 F (37 C)-99.9 F (37.7 C)] 99.3 F (37.4 C) (05/25 0739) Pulse Rate:  [50-74] 74 (05/25 0739) Resp:  [17-21] 17 (05/25 0739) BP: (114-151)/(72-94) 132/80 (05/25 0700) SpO2:  [97 %-100 %] 98 % (05/25 0739) Arterial Line BP: (97-161)/(74-108) 135/78 (05/25 0700) FiO2 (%):  [40 %] 40 % (05/25 0739) Weight:  [93.9 kg] 93.9 kg (05/25 0426) Estimated body mass index is 28.87 kg/m as calculated from the following:   Height as of this encounter: 5\' 11"  (1.803 m).   Weight as of this encounter: 93.9 kg.   Intake/Output from previous day: 05/24 0701 - 05/25 0700 In: 1749.7 [I.V.:611; NG/GT:767.9; IV Piggyback:370.8] Out: 1041 [Urine:845; Drains:196] Intake/Output this shift: No intake/output data recorded.  Physical exam the patient is somnolent but arousable.  He is right hemiplegic.  He is purposeful/follows commands on the left.  His ventriculostomy is patent.  Lab Results: Recent Labs    08/28/22 0155 08/29/22 0439  WBC 9.5 6.0  HGB 10.2* 10.5*  HCT 32.5* 34.9*  PLT 178 171   BMET Recent Labs    08/28/22 0155 08/28/22 0808 08/28/22 1935 08/29/22 0439  NA 156*   < > 159* 157*  K 3.6  --   --  3.8  CL 130*  --   --  129*  CO2 20*  --   --  21*  GLUCOSE 104*  --   --  153*  BUN 16  --   --  26*  CREATININE 0.96  --   --  1.09  CALCIUM 8.0*  --   --  8.8*   < > = values in this interval not displayed.    Studies/Results: DG Abd Portable 1V  Result Date: 08/28/2022 CLINICAL DATA:  161096 Encounter for imaging study to confirm orogastric (OG) tube placement [id number] EXAM: PORTABLE ABDOMEN - 1 VIEW COMPARISON:  CT 08/26/2022. FINDINGS: Nasogastric tube tip and side port overlie the stomach. Nonobstructive bowel gas pattern. Retained contrast material in the renal collecting systems. IMPRESSION: Nasogastric tube tip and side  port overlie the stomach. Nonobstructive bowel gas pattern. Electronically Signed   By: Caprice Renshaw M.D.   On: 08/28/2022 08:11   CT ANGIO HEAD NECK W WO CM  Result Date: 08/28/2022 CLINICAL DATA:  67 year old male found down with intracranial hemorrhage. Left thalamic bleed with intraventricular and subarachnoid extension of blood. EVD placed. EXAM: CT ANGIOGRAPHY HEAD AND NECK WITH AND WITHOUT CONTRAST TECHNIQUE: Multidetector CT imaging of the head and neck was performed using the standard protocol during bolus administration of intravenous contrast. Multiplanar CT image reconstructions and MIPs were obtained to evaluate the vascular anatomy. Carotid stenosis measurements (when applicable) are obtained utilizing NASCET criteria, using the distal internal carotid diameter as the denominator. RADIATION DOSE REDUCTION: This exam was performed according to the departmental dose-optimization program which includes automated exposure control, adjustment of the mA and/or kV according to patient size and/or use of iterative reconstruction technique. CONTRAST:  75mL OMNIPAQUE IOHEXOL 350 MG/ML SOLN COMPARISON:  Head CT 08/27/2022 and earlier. Chest CTA 08/26/2022. FINDINGS: CT HEAD Brain: Oval and confluent left thalamic hematoma encompasses 13 x 39 x 22 mm (AP by transverse by CC), about 1 mm smaller in each direction since presentation on 08/26/2022. Ongoing large volume of intraventricular blood. Stable EVD terminating in the right lateral ventricle near the septum pellucidum. Stable  ventricle size and configuration. Stable mostly pre medullary cistern subarachnoid blood extension. Increased hypodense edema in and around the thalamus since presentation. But regional mass effect has not significantly changed. There is stable mild rightward midline shift about 2 mm. Stable gray-white differentiation elsewhere. No cortically based acute infarct identified. Basilar cisterns remain patent. Calvarium and skull base:  Stable right vertex burr hole. No acute osseous abnormality identified. Paranasal sinuses: Increasing layering sphenoid sinus fluid in the setting of intubation. Left maxillary retention cyst again noted. Tympanic cavities and mastoids remain well aerated. Orbits: Largely resolved postoperative right scalp vertex soft tissue gas. No new orbit or scalp soft tissue finding. CTA NECK Skeleton: Some limited cervical spine bony detail due to extensive paravertebral venous contrast reflux. No acute osseous abnormality identified. Cervical spine degeneration. Upper chest: Endotracheal tube tip partially visible above the carina. Lung apices are clear. Enteric tube visible in the thoracic esophagus at that level. There appears to be functional stenosis of the left innominate vein in the superior mediastinum and part related to tortuous arch and great vessels. Left upper extremity contrast injection. Other neck: Intubated with appropriate course of the visible ET and enteric tube. Pronounced venous contrast reflux in the neck, especially the left IJ and paravertebral venous system. No acute soft tissue finding identified. Aortic arch: Unchanged aneurysmal distal aortic arch/proximal descending thoracic aorta as demonstrated on chest CTA 08/26/2022 (please see that report) 3 vessel arch configuration. Minor arch atherosclerosis. Right carotid system: Incompletely visible brachiocephalic artery today but negative recently. Right CCA origin remains normal. Capacious right carotid bifurcation. No right carotid plaque or stenosis in the neck. Mild tortuosity. Left carotid system: Similar mild tortuosity with no plaque or stenosis to the skull base. Vertebral arteries: Mildly tortuous proximal right subclavian artery. Right vertebral artery origin appears normal. Right V1 and V2 segment detail partially obscured by paravertebral venous contrast. But the right vertebral artery is patent to the skull base, grossly normal. Proximal  left subclavian artery mild tortuosity. Normal left vertebral artery origin. Normal left V1 segment. Left V2 and V3 detail intermittently limited by paravertebral venous contrast but the left vertebral artery is patent to the skull base, grossly normal. CTA HEAD Posterior circulation: Codominant vertebral V4 segments and vertebrobasilar junction appear normal. Normal left PICA and dominant appearing right AICA origins. Patent basilar artery without stenosis. Patent SCA origins. Fetal type PCA origins more so the left. Unremarkable basilar tip. Bilateral PCA branches are within normal limits. No left thalamic hemorrhage CTA spot sign. No abnormal vascularity identified in association with the hematoma. Anterior circulation: Both ICA siphons are patent. Mild calcified supraclinoid plaque bilaterally. No siphon stenosis. Bilateral posterior communicating artery origins appear normal. Patent carotid termini. Dominant right and diminutive or absent left ACA A1 segments. Normal anterior communicating artery. Bilateral ACA branches are within normal limits. Left MCA M1 segment and bifurcation are patent without stenosis. Right MCA M1 segment and trifurcation are patent without stenosis. Bilateral MCA branches are within normal limits. Venous sinuses: Intracranial venous structures appear grossly patent. The straight sinus, vein of Galen and internal cerebral veins are patent and enhancing. Anatomic variants: Dominant right and diminutive or absent left ACA A1 segments. Fetal type PCA origins. Review of the MIP images confirms the above findings IMPRESSION: 1. Left thalamic hematoma is stable to slightly smaller since presentation. Surrounding edema with mild regional mass effect. 2. Stable EVD. Ongoing relatively large volume of intraventricular hemorrhage with stable ventricle size and configuration. Small volume mostly pre medullary cistern  SAH is stable. 3. Stable intracranial mass effect with mild rightward midline  shift (2 mm). No new intracranial abnormality. 4. CTA is negative for aneurysm, CTA spot sign, or vascular malformation in association with the left thalamic hematoma. And no significant atherosclerosis or arterial stenosis in the head or neck. 5. 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 See Chest CTA 08/26/2022. 6. Satisfactory visible endotracheal and enteric tubes. Electronically Signed   By: Odessa Fleming M.D.   On: 08/28/2022 05:46   ECHOCARDIOGRAM COMPLETE  Result Date: 08/27/2022    ECHOCARDIOGRAM REPORT   Patient Name:   Jimmy Marshall Date of Exam: 08/27/2022 Medical Rec #:  [id number]       Height:       71.0 in Accession #:    [id number]      Weight:       195.5 lb Date of Birth:  1955-08-28      BSA:          2.088 m Patient Age:    66 years        BP:           122/90 mmHg Patient Gender: M               HR:           73 bpm. Exam Location:  Inpatient Procedure: 2D Echo, Cardiac Doppler and Color Doppler Indications:    Stroke  History:        Patient has no prior history of Echocardiogram examinations.                 Risk Factors:Hypertension.  Sonographer:    Darlys Gales Referring Phys: [id number] DEVON SHAFER IMPRESSIONS  1. Left ventricular ejection fraction, by estimation, is 50 to 55%. The left ventricle has low normal function. The left ventricle has no regional wall motion abnormalities. Left ventricular diastolic parameters are consistent with Grade I diastolic dysfunction (impaired relaxation).  2. Right ventricular systolic function is normal. The right ventricular size is normal.  3. The mitral valve is normal in structure. No evidence of mitral valve regurgitation. No evidence of mitral stenosis.  4. The aortic valve is normal in structure. Aortic valve regurgitation is not visualized. No aortic stenosis is present.  5. The inferior vena cava is dilated in size with <50% respiratory variability, suggesting right  atrial pressure of 15 mmHg. FINDINGS  Left Ventricle: Left ventricular ejection fraction, by estimation, is 50 to 55%. The left ventricle has low normal function. The left ventricle has no regional wall motion abnormalities. The left ventricular internal cavity size was normal in size. There is no left ventricular hypertrophy. Left ventricular diastolic parameters are consistent with Grade I diastolic dysfunction (impaired relaxation). Right Ventricle: The right ventricular size is normal. No increase in right ventricular wall thickness. Right ventricular systolic function is normal. Left Atrium: Left atrial size was normal in size. Right Atrium: Right atrial size was normal in size. Pericardium: There is no evidence of pericardial effusion. Mitral Valve: The mitral valve is normal in structure. No evidence of mitral valve regurgitation. No evidence of mitral valve stenosis. Tricuspid Valve: The tricuspid valve is normal in structure. Tricuspid valve regurgitation is not demonstrated. No evidence of tricuspid stenosis. Aortic Valve: The aortic valve is normal in structure. Aortic valve regurgitation is not visualized. No aortic stenosis is present. Aortic valve mean gradient measures 3.0 mmHg. Aortic valve peak gradient measures 5.4 mmHg. Aortic  valve area, by VTI measures 2.63 cm. Pulmonic Valve: The pulmonic valve was normal in structure. Pulmonic valve regurgitation is not visualized. No evidence of pulmonic stenosis. Aorta: The aortic root is normal in size and structure. Venous: The inferior vena cava is dilated in size with less than 50% respiratory variability, suggesting right atrial pressure of 15 mmHg. IAS/Shunts: No atrial level shunt detected by color flow Doppler.  LEFT VENTRICLE PLAX 2D LVIDd:         5.00 cm   Diastology LVIDs:         3.40 cm   LV e' medial:    6.85 cm/s LV PW:         1.10 cm   LV E/e' medial:  7.6 LV IVS:        1.00 cm   LV e' lateral:   6.74 cm/s LVOT diam:     2.00 cm   LV E/e'  lateral: 7.7 LV SV:         58 LV SV Index:   28 LVOT Area:     3.14 cm  RIGHT VENTRICLE RV S prime:     18.80 cm/s TAPSE (M-mode): 3.2 cm LEFT ATRIUM             Index        RIGHT ATRIUM           Index LA Vol (A2C):   35.9 ml 17.19 ml/m  RA Area:     14.30 cm LA Vol (A4C):   26.4 ml 12.64 ml/m  RA Volume:   38.30 ml  18.34 ml/m LA Biplane Vol: 31.1 ml 14.89 ml/m  AORTIC VALVE AV Area (Vmax):    2.71 cm AV Area (Vmean):   2.53 cm AV Area (VTI):     2.63 cm AV Vmax:           116.00 cm/s AV Vmean:          85.900 cm/s AV VTI:            0.220 m AV Peak Grad:      5.4 mmHg AV Mean Grad:      3.0 mmHg LVOT Vmax:         99.90 cm/s LVOT Vmean:        69.100 cm/s LVOT VTI:          0.184 m LVOT/AV VTI ratio: 0.84  AORTA Ao Root diam: 3.40 cm MITRAL VALVE MV Area (PHT): 3.40 cm    SHUNTS MV Decel Time: 223 msec    Systemic VTI:  0.18 m MV E velocity: 51.80 cm/s  Systemic Diam: 2.00 cm MV A velocity: 58.30 cm/s MV E/A ratio:  0.89 Aditya Sabharwal Electronically signed by Dorthula Nettles Signature Date/Time: 08/27/2022/11:51:37 AM    Final     Assessment/Plan: Left thalamic hemorrhage, hydrocephalus: Patient is stable neurologically.  His ventriculostomy is patent.  Will continue with supportive care.  LOS: 3 days     Cristi Loron 08/29/2022, 8:38 AM     Patient ID: Ayesha Rumpf, male   DOB: [birth year], 67 y.o.   MRN: [id number]

## 2022-08-30 ENCOUNTER — Inpatient Hospital Stay (HOSPITAL_COMMUNITY): Payer: No Typology Code available for payment source

## 2022-08-30 DIAGNOSIS — I161 Hypertensive emergency: Secondary | ICD-10-CM

## 2022-08-30 DIAGNOSIS — I610 Nontraumatic intracerebral hemorrhage in hemisphere, subcortical: Secondary | ICD-10-CM | POA: Diagnosis not present

## 2022-08-30 DIAGNOSIS — J9601 Acute respiratory failure with hypoxia: Secondary | ICD-10-CM | POA: Diagnosis not present

## 2022-08-30 LAB — SODIUM
Sodium: 154 mmol/L — ABNORMAL HIGH (ref 135–145)
Sodium: 155 mmol/L — ABNORMAL HIGH (ref 135–145)
Sodium: 152 mmol/L — ABNORMAL HIGH (ref 135–145)
Sodium: 153 mmol/L — ABNORMAL HIGH (ref 135–145)

## 2022-08-30 LAB — CULTURE, BLOOD (ROUTINE X 2)
Culture: NO GROWTH
Culture: NO GROWTH
Special Requests: ADEQUATE

## 2022-08-30 LAB — BASIC METABOLIC PANEL
Anion gap: 11 (ref 5–15)
BUN: 25 mg/dL — ABNORMAL HIGH (ref 8–23)
CO2: 22 mmol/L (ref 22–32)
Calcium: 9.1 mg/dL (ref 8.9–10.3)
Chloride: 123 mmol/L — ABNORMAL HIGH (ref 98–111)
Creatinine, Ser: 1.11 mg/dL (ref 0.61–1.24)
GFR, Estimated: >60 mL/min (ref >60)
Glucose, Bld: 126 mg/dL — ABNORMAL HIGH (ref 70–99)
Potassium: 3.1 mmol/L — ABNORMAL LOW (ref 3.5–5.1)
Sodium: 156 mmol/L — ABNORMAL HIGH (ref 135–145)

## 2022-08-30 LAB — CBC
HCT: 36 % — ABNORMAL LOW (ref 39.0–52.0)
Hemoglobin: 11.3 g/dL — ABNORMAL LOW (ref 13.0–17.0)
MCH: 27.4 pg (ref 26.0–34.0)
MCHC: 31.4 g/dL (ref 30.0–36.0)
MCV: 87.2 fL (ref 80.0–100.0)
Platelets: 191 10*3/uL (ref 150–400)
RBC: 4.13 MIL/uL — ABNORMAL LOW (ref 4.22–5.81)
RDW: 18.9 % — ABNORMAL HIGH (ref 11.5–15.5)
WBC: 7.6 10*3/uL (ref 4.0–10.5)
nRBC: 0 % (ref 0.0–0.2)

## 2022-08-30 LAB — CULTURE, RESPIRATORY W GRAM STAIN

## 2022-08-30 LAB — GLUCOSE, CAPILLARY
Glucose-Capillary: 144 mg/dL — ABNORMAL HIGH (ref 70–99)
Glucose-Capillary: 123 mg/dL — ABNORMAL HIGH (ref 70–99)
Glucose-Capillary: 114 mg/dL — ABNORMAL HIGH (ref 70–99)
Glucose-Capillary: 160 mg/dL — ABNORMAL HIGH (ref 70–99)
Glucose-Capillary: 147 mg/dL — ABNORMAL HIGH (ref 70–99)
Glucose-Capillary: 117 mg/dL — ABNORMAL HIGH (ref 70–99)

## 2022-08-30 LAB — PHOSPHORUS: Phosphorus: 3.4 mg/dL (ref 2.5–4.6)

## 2022-08-30 LAB — TRIGLYCERIDES: Triglycerides: 160 mg/dL — ABNORMAL HIGH (ref <150)

## 2022-08-30 MED ORDER — SPIRONOLACTONE 25 MG PO TABS
25.0000 mg | ORAL_TABLET | Freq: Every day | ORAL | Status: DC
  Administered 2022-08-30 – 2022-09-05 (×7): 25 mg
  Filled 2022-08-30 (×7): qty 1

## 2022-08-30 MED ORDER — LOSARTAN POTASSIUM 50 MG PO TABS
50.0000 mg | ORAL_TABLET | Freq: Once | ORAL | Status: AC
  Administered 2022-08-30: 50 mg
  Filled 2022-08-30: qty 1

## 2022-08-30 MED ORDER — POTASSIUM CHLORIDE 20 MEQ PO PACK
20.0000 meq | PACK | ORAL | Status: AC
  Administered 2022-08-30 (×2): 20 meq
  Filled 2022-08-30 (×2): qty 1

## 2022-08-30 MED ORDER — BISACODYL 10 MG RE SUPP
10.0000 mg | Freq: Once | RECTAL | Status: AC
  Administered 2022-08-30: 10 mg via RECTAL
  Filled 2022-08-30: qty 1

## 2022-08-30 MED ORDER — AMLODIPINE BESYLATE 10 MG PO TABS: 10.0000 mg | ORAL_TABLET | Freq: Every day | ORAL | Status: DC

## 2022-08-30 MED ORDER — LACTATED RINGERS IV SOLN: INTRAVENOUS | Status: AC

## 2022-08-30 MED ORDER — LOSARTAN POTASSIUM 50 MG PO TABS: 50.0000 mg | ORAL_TABLET | Freq: Once | ORAL | Status: DC

## 2022-08-30 MED ORDER — SODIUM CHLORIDE 0.9 % IV SOLN
3.0000 g | Freq: Four times a day (QID) | INTRAVENOUS | Status: AC
  Administered 2022-08-30 – 2022-09-03 (×18): 3 g via INTRAVENOUS
  Filled 2022-08-30 (×17): qty 8

## 2022-08-30 MED ORDER — POTASSIUM CHLORIDE 10 MEQ/100ML IV SOLN
10.0000 meq | INTRAVENOUS | Status: AC
  Administered 2022-08-30 (×4): 10 meq via INTRAVENOUS
  Filled 2022-08-30 (×4): qty 100

## 2022-08-30 MED ORDER — LOSARTAN POTASSIUM 50 MG PO TABS
100.0000 mg | ORAL_TABLET | Freq: Every day | ORAL | Status: DC
  Administered 2022-08-31 – 2022-09-03 (×4): 100 mg
  Filled 2022-08-30 (×4): qty 2

## 2022-08-30 MED ORDER — AMLODIPINE BESYLATE 10 MG PO TABS
10.0000 mg | ORAL_TABLET | Freq: Every day | ORAL | Status: DC
  Administered 2022-08-30 – 2022-09-05 (×7): 10 mg
  Filled 2022-08-30 (×7): qty 1

## 2022-08-30 NOTE — Progress Notes (Signed)
OT Cancellation Note and Discharge  Patient Details Name: Suleiman Vacanti MRN: [id number] DOB: 1956/01/12   Cancelled Treatment:    Reason Eval/Treat Not Completed: Patient not medically ready. Noted pt remains intubated and sedated with ventriculostomy. Patient has not been appropriate for OT evaluation x 3 days. Will sign-off and please re-order when pt is appropriate for OT evaluation.   Lindon Romp OT Acute Rehabilitation Services Office [phone]    Evette Georges 08/30/2022, 7:26 AM

## 2022-08-30 NOTE — Progress Notes (Signed)
Patient ID: Jimmy Marshall, male   DOB: 1955/06/27, 67 y.o.   MRN: [id number] Patient is on ventilator with substantial secretions and appears not to follow any commands today.  There is no eye-opening spontaneously.  He has a forced downward gaze.  His IVC is in place with continuous drainage of about 7 to 10 cc/h.  He has a large hemorrhage with current sequelae.  Discussed with Dr. Raynald Kemp who is considering more imaging perhaps an MRI.  Will follow along with results.

## 2022-08-30 NOTE — Progress Notes (Signed)
[facility] ADULT ICU REPLACEMENT PROTOCOL   The patient does apply for the [facility] Adult ICU Electrolyte Replacment Protocol based on the criteria listed below:   1.Exclusion criteria: TCTS, ECMO, Dialysis, and Myasthenia Gravis patients 2. Is GFR >/= 30 ml/min? Yes.    Patient's GFR today is > 60 3. Is SCr </= 2? Yes.   Patient's SCr is 1.11 mg/dL 4. Did SCr increase >/= 0.5 in 24 hours? No. 5.Pt's weight >40kg  Yes.   6. Abnormal electrolyte(s): K+ 3.1  7. Electrolytes replaced per protocol 8.  Call MD STAT for K+ </= 2.5, Phos </= 1, or Mag </= 1 Physician:  Dr Eather Colas, Jettie Booze 08/30/2022 6:40 AM

## 2022-08-30 NOTE — Progress Notes (Signed)
eLink Physician-Brief Progress Note Patient Name: Windom Balser DOB: 06-02-1955 MRN: [id number]   Date of Service  08/30/2022  HPI/Events of Note  Oligurea in this shift. Cr 1.02 from 25 th. Camera: On 40% fio2. Not on pressors.  Discussed with RN. Not on fluids.  Oliguria/mild AKI - start fluids.  ICH neuro wise stable.   eICU Interventions       Intervention Category Intermediate Interventions: Oliguria - evaluation and management  Ranee Gosselin 08/30/2022, 3:39 AM

## 2022-08-30 NOTE — Progress Notes (Signed)
STROKE TEAM PROGRESS NOTE   INTERVAL HISTORY RN and Dr. Danielle Dess are at the bedside. Pt still on EVD drain, not open eyes, not follow commands. Still has copious secretion from ET, low grade fever. On sedation and cleviprex.    Vitals:   08/30/22 0745 08/30/22 0800 08/30/22 0815 08/30/22 1100  BP:  (!) 143/85  (!) 142/92  Pulse: 83 85 87 (!) 110  Resp: 19 18 18 15   Temp: 99.1 F (37.3 C) 99.3 F (37.4 C) 99.3 F (37.4 C) 99.9 F (37.7 C)  TempSrc:  Bladder    SpO2: 96% 94% 94% 94%  Weight:      Height:       CBC:  Recent Labs  Lab 08/26/22 1420 08/26/22 1614 08/29/22 0439 08/30/22 0541  WBC 11.7*   < > 6.0 7.6  NEUTROABS 10.8*  --   --   --   HGB 12.7*   < > 10.5* 11.3*  HCT 41.8   < > 34.9* 36.0*  MCV 89.9   < > 91.8 87.2  PLT 195   < > 171 191   < > = values in this interval not displayed.   Basic Metabolic Panel:  Recent Labs  Lab 08/28/22 0155 08/28/22 0808 08/28/22 1644 08/28/22 1935 08/29/22 0439 08/29/22 0815 08/30/22 0541 08/30/22 0813  NA 156*   < >  --    < > 157*   < > 156* 155*  K 3.6  --   --   --  3.8  --  3.1*  --   CL 130*  --   --   --  129*  --  123*  --   CO2 20*  --   --   --  21*  --  22  --   GLUCOSE 104*  --   --   --  153*  --  126*  --   BUN 16  --   --   --  26*  --  25*  --   CREATININE 0.96  --   --   --  1.09  --  1.11  --   CALCIUM 8.0*  --   --   --  8.8*  --  9.1  --   MG 2.3  --  2.7*  --   --   --   --   --   PHOS 2.1*  --  2.6  --   --   --  3.4  --    < > = values in this interval not displayed.   Lipid Panel:  Recent Labs  Lab 08/27/22 0208 08/30/22 0541  CHOL 155  --   TRIG 105  114 160*  HDL 33*  --   CHOLHDL 4.7  --   VLDL 21  --   LDLCALC 101*  --    HgbA1c:  Recent Labs  Lab 08/27/22 0208  HGBA1C 6.0*   Urine Drug Screen:  Recent Labs  Lab 08/29/22 1018  LABOPIA NONE DETECTED  COCAINSCRNUR NONE DETECTED  LABBENZ NONE DETECTED  AMPHETMU NONE DETECTED  THCU POSITIVE*  LABBARB NONE DETECTED     Alcohol Level No results for input(s): "ETH" in the last 168 hours.  IMAGING past 24 hours DG CHEST PORT 1 VIEW  Result Date: 08/30/2022 CLINICAL DATA:  [id number] Impaired oral gastric feeding tube, subsequent encounter [id number] EXAM: PORTABLE CHEST 1 VIEW COMPARISON:  Chest radiograph yesterday. FINDINGS: Endotracheal tube tip 4.8 cm from the carina. Enteric  tube tip below the diaphragm not included in the field of view. Again seen elevation of right hemidiaphragm. Similar streaky bibasilar opacities. No large pleural effusion. No pneumothorax. Stable heart size and mediastinal contours. IMPRESSION: 1. Stable support apparatus. 2. Unchanged streaky bibasilar opacities, favor atelectasis. Electronically Signed   By: Narda Rutherford M.D.   On: 08/30/2022 10:35    PHYSICAL EXAM  Temp:  [99 F (37.2 C)-100 F (37.8 C)] 99.9 F (37.7 C) (05/26 1100) Pulse Rate:  [53-110] 110 (05/26 1100) Resp:  [15-24] 15 (05/26 1100) BP: (118-143)/(71-92) 142/92 (05/26 1100) SpO2:  [92 %-99 %] 94 % (05/26 1100) Arterial Line BP: (124-173)/(66-86) 173/85 (05/26 1100) FiO2 (%):  [40 %] 40 % (05/26 0800)  General - Well nourished, well developed, intubated on sedation.  Ophthalmologic - fundi not visualized due to noncooperation.  Cardiovascular - Regular rhythm with mild tachycardia.  Neuro - intubated on propofol and fentanyl, eyes closed, not following commands. With forced eye opening, eyes in mid downward position, not blinking to visual threat, doll's eyes sluggish, not tracking, b/l pupil 2mm, sluggish to light. Corneal reflex weakly present bilaterally, gag and cough present. Breathing over the vent.  Facial symmetry not able to test due to ET tube.  Tongue protrusion not cooperative. On pain stimulation, left UE mild withdraw and LLE slight withdraw however, both LUE and LLE have spontaneous movement. RUE and RLE flaccid. Sensation, coordination and gait not tested.    ASSESSMENT/PLAN [name] is a 67 y.o. 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 S/p EVD, Ventricular system may be slightly smaller but otherwise no significant improvement. Mild new rightward midline shift, 2-3 mm CTA head & neck no AVM or aneurysm MRI pending 2D Echo LVEF 50-55% EEG no seizure LDL 101 HgbA1c 5.6  UDS - positive for THC EEG suggestive of severe diffuse encephalopathy of nonspecific etiology though likely related to sedation.  No seizures or epileptiform discharges seen throughout recording. VTE prophylaxis - heparin subq No antithrombotic prior to admission, now on No antithrombotic due to IPH On keppra for 7 days for seizure prevention Therapy recommendations:  pending Disposition:  pending  Hydrocephalus s/p EVD placement Cerebral edema On HTS 3% s/p 250 mL bolus, 75 cc/h gtt -> off Na 148 > 152->156->153  Off free water MRI pending  Hypertensive emergency Home meds:  losartan, diltiazem Stable now on cleviprex gtt On losartan 100, amlodipine 10, and spironolactone 25 PRN labetalol and hydralazine for hypertension  Blood pressure goal systolic < 160 Long-term BP goal normotensive  Hyperlipidemia Home meds: None LDL 101, goal < 70 Consider adding statin at discharge  Other Stroke  Risk Factors Substance abuse - UDS:  THC POSITIVE. Patient will be advised to stop using due to stroke risk.  Other Active Problems Schizoaffective disorder, depressive type On Zyprexa Glaucoma Aortic aneurysm   Hospital day # 4  This patient is critically  ill due to ICH, IVH and  hydrocephalus, respiratory failure, hypertensive emergency and at significant risk of neurological worsening, death form hematoma expansion, brain herniation, obstructive hydrocephalus, status epilepticus. This patient's care requires constant monitoring of vital signs, hemodynamics, respiratory and cardiac monitoring, review of multiple databases, neurological assessment, discussion with family, other specialists and medical decision making of high complexity. I spent 35 minutes of neurocritical care time in the care of this patient. I discussed with Dr. Danielle Dess.  Marvel Plan, MD PhD Stroke Neurology 08/30/2022 10:14 PM   To contact Stroke Continuity provider, please refer to [email or web address]. After hours, contact General Neurology

## 2022-08-30 NOTE — Progress Notes (Signed)
NAME:  Jimmy Marshall, MRN:  [id number], DOB:  March 17, 1956, LOS: 4 ADMISSION DATE:  08/26/2022 CONSULTATION DATE:  08/26/2022 REFERRING MD:  [place] - EDP CHIEF COMPLAINT:  AMS, ICH   History of Present Illness:  67 year old man who presented to [facility] ED 5/22 after being found down at a hotel.  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  Patient is intubated and sedated, therefore history is obtained from chart review.  Per EMS, patient is currently residing in a hotel and was found down by staff who went to collect rent from patient.  Found unresponsive, RR 4 and Narcan 1mg  (IN) and 3mg  IV given with no response.  Intubated on arrival to ED. CXR notable for left greater than right atelectasis.  Per EDP, BP very labile; hypertensive on admission then dropping BP to SBP 70s requiring Neo pushes.  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  Neurosurgery was consulted.  CTA Chest/A/P negative for aortic dissection, overall unchanged enlargement of distal aortic arch/ascending thoracic aorta.  PCCM consulted for ICU admission.  Pertinent Medical History:   Past Medical History:  Diagnosis Date   GERD (gastroesophageal reflux disease)    Glaucoma    Hypertension    Schizoaffective disorder, depressive type (HCC)    "dx'd in the 1990's"   Schizophrenia, schizo-affective (HCC)    Significant Hospital Events: Including procedures, antibiotic start and stop dates in addition to other pertinent events   5/22 -presented via EMS after being found down at hotel.  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 Neuro/Stroke consulted. Neurosurgery consulted. EVD placement. 5/24 3%NS held for elevated Na and Cl, CT imaging shows non-worsening hematoma  Interim  History / Subjective:  Increased secretions leading to failed SBTs  Objective:  Blood pressure 135/88, pulse 100, temperature 100 F (37.8 C), resp. rate 18, height 5\' 11"  (1.803 m), weight 93.9 kg, SpO2 94 %.    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 - NSGY following for EVD - Neuro/Stroke following - pain control and cleviprex SBP 120-160 per neuro - S/p Keppra load, AEDs per Neuro - HTS 3% held and continue FWF for now, per Neuro, goal 150-155 - Frequent neuro checks - Neuroprotective measures: HOB > 30 degrees, normoglycemia, normothermia, electrolytes WNL - PT/OT/SLP when able to participate in care  Acute hypoxemic respiratory failure Aspiration pneumonia - Continue full vent support, daily SAT/SBT as able  - Wean FiO2 for O2 sat > 90% - VAP bundle - tracheal aspirate, start unasyn - Pulmonary hygiene - start CPT, HTS nebs - wean fentanyl, propofol for rass 0 to -1  Thoracic aortic intramural hematoma and  descending thoracic aortic aneurysm HTN Noted on scan 08/2021 4.6 cm. - SBP goal per above - Resume home medications as appropriate  Schizoaffective disorder/schizophrenia - Resume home Zyprexa, Remeron as clinically appropriate  GERD - PPI  At risk for hyperglycemia - Hold home metformin - SSI - CBGs Q4H - Goal CBG 140-180  Best Practice:  (right click and "Reselect all SmartList Selections" daily)   Diet/type: tubefeeds DVT prophylaxis: LMWH GI prophylaxis: PPI Lines: Arterial Line Foley:  Yes, and it is still needed Code Status:  full code Last date of multidisciplinary goals of care discussion 5/24 pt's daughter updated at bedside  Labs:  CBC: Recent Labs  Lab 08/26/22 1420 08/26/22 1614 08/27/22 0208 08/27/22 0234 08/28/22 0155 08/29/22 0439 08/30/22 0541  WBC 11.7*  --  9.6  --  9.5 6.0 7.6  NEUTROABS 10.8*  --   --   --   --   --   --   HGB 12.7*   < > 11.4* 11.9* 10.2* 10.5* 11.3*  HCT 41.8   < > 35.8* 35.0* 32.5* 34.9* 36.0*  MCV 89.9  --  85.6  --  87.1 91.8 87.2  PLT 195  --  220  --  178 171 191   < > = values in this interval not displayed.   Basic Metabolic Panel: Recent Labs  Lab 08/26/22 1420 08/26/22 1614 08/27/22 0208 08/27/22 0234 08/27/22 0737 08/27/22 1441 08/27/22 2021 08/28/22 0155 08/28/22 0808 08/28/22 1644 08/28/22 1935 08/29/22 0439 08/29/22 0815 08/29/22 1309 08/29/22 2017 08/30/22 0311 08/30/22 0541 08/30/22 0813  NA 141  140   < > 148* 152*   < >  --    < > 156*   < >  --    < > 157*   < > 154* 156* 154* 156* 155*  K 3.4*   < > 3.5 3.4*  --   --   --  3.6  --   --   --  3.8  --   --   --   --  3.1*  --   CL 106  --  119*  --   --   --   --  130*  --   --   --  129*  --   --   --   --  123*  --   CO2 18*  --  20*  --   --   --   --  20*  --   --   --  21*  --   --   --   --  22  --   GLUCOSE 138*  --  111*  --   --   --   --  104*  --   --   --  153*  --   --   --   --  126*  --   BUN 18  --  14  --   --   --   --  16  --   --   --  26*  --   --   --   --  25*  --   CREATININE 1.28*  --  1.08  --   --   --   --  0.96  --   --   --  1.09  --   --   --   --  1.11  --   CALCIUM 8.9  --  8.6*  --   --   --   --  8.0*  --   --   --  8.8*  --   --   --   --  9.1  --   MG  --   --  2.1  --   --  2.2  --  2.3  --  2.7*  --   --   --   --   --   --   --   --   PHOS  --   --   1.4*  --   --  2.5  --  2.1*  --  2.6  --   --   --   --   --   --  3.4  --    < > = values in this interval not displayed.   GFR: Estimated Creatinine Clearance: 78.6 mL/min (by C-G formula based on SCr of 1.11 mg/dL). Recent Labs  Lab 08/26/22 1311 08/26/22 1420 08/26/22 1421 08/26/22 1510 08/27/22 0208 08/28/22 0155 08/29/22 0439 08/30/22 0541  PROCALCITON  --   --   --  0.18  --   --   --   --   WBC  --    < >  --   --  9.6 9.5 6.0 7.6  LATICACIDVEN 4.3*  --  4.4*  --   --   --   --   --    < > = values in this interval not displayed.    Liver Function Tests: Recent Labs  Lab 08/26/22 1420  AST 24  ALT 18  ALKPHOS 52  BILITOT 0.8  PROT 7.0  ALBUMIN 3.9   No results for input(s): "LIPASE", "AMYLASE" in the last 168 hours. No results for input(s): "AMMONIA" in the last 168 hours.  ABG:    Component Value Date/Time   PHART 7.459 (H) 08/27/2022 0234   PCO2ART 25.1 (L) 08/27/2022 0234   PO2ART 124 (H) 08/27/2022 0234   HCO3 17.7 (L) 08/27/2022 0234   TCO2 18 (L) 08/27/2022 0234   ACIDBASEDEF 4.0 (H) 08/27/2022 0234   O2SAT 99 08/27/2022 0234    Coagulation Profile: No results for input(s): "INR", "PROTIME" in the last 168 hours.  Cardiac Enzymes: No results for input(s): "CKTOTAL", "CKMB", "CKMBINDEX", "TROPONINI" in the last 168 hours.  HbA1C: Hgb A1c MFr Bld  Date/Time Value Ref Range Status  08/27/2022 02:08 AM 6.0 (H) 4.8 - 5.6 % Final    Comment:    (NOTE)         Prediabetes: 5.7 - 6.4         Diabetes: >6.4         Glycemic control for adults with diabetes: <7.0   08/26/2022 04:06 PM 5.6 4.8 - 5.6 % Final    Comment:    (NOTE) Pre diabetes:          5.7%-6.4%  Diabetes:              >6.4%  Glycemic control for   <7.0% adults with diabetes    CBG: Recent Labs  Lab 08/29/22 2025 08/29/22 2350 08/30/22 0301 08/30/22 0759 08/30/22 1148  GLUCAP 137* 143* 144* 123* 114*    Review of Systems:   Patient is encephalopathic and/or  intubated; therefore, history has been obtained from chart review.   Past Medical History:  He,  has a past medical history of GERD (gastroesophageal reflux disease), Glaucoma, Hypertension, Schizoaffective disorder, depressive type (HCC), and Schizophrenia, schizo-affective (HCC).   Surgical History:   Past Surgical History:  Procedure Laterality  Date   FRACTURE SURGERY  1996   jaw    INCISION AND DRAINAGE / EXCISION THYROGLOSSAL CYST  06/30/11   w/central hyoid resection   THYROGLOSSAL DUCT CYST  06/30/2011   Procedure: THYROGLOSSAL DUCT CYST;  Surgeon: Osborn Coho, MD;  Location: [facility] OR;  Service: ENT;  Laterality: N/A;  excision of thyroglossal duct cyst   Social History:   reports that he has never smoked. He has never used smokeless tobacco. He reports current alcohol use. He reports current drug use. Drugs: Marijuana and Cocaine.   Family History:  His family history includes Cancer in his father; Heart attack in his mother; Hypertension in his mother.   Allergies: No Known Allergies   Home Medications: Prior to Admission medications   Medication Sig Start Date End Date Taking? Authorizing Provider  diltiazem (CARTIA XT) 240 MG 24 hr capsule Take 1 capsule (240 mg total) by mouth every morning. 08/10/22 09/09/22  Tolia, Sunit, DO  losartan (COZAAR) 100 MG tablet Take 1 tablet (100 mg total) by mouth daily at 10 pm. 08/10/22 09/09/22  Tolia, Sunit, DO  metFORMIN (GLUCOPHAGE) 500 MG tablet Take 500 mg by mouth 2 (two) times daily with a meal.    [provider]  mirtazapine (REMERON) 15 MG tablet Take 15 mg by mouth at bedtime. 02/27/20   [provider]  OLANZapine (ZYPREXA) 10 MG tablet Take 10 mg by mouth at bedtime.    [provider]  OLANZapine (ZYPREXA) 20 MG tablet Take 20 mg by mouth at bedtime.    [provider]  spironolactone (ALDACTONE) 25 MG tablet Take 1 tablet (25 mg total) by mouth every morning. 08/10/22 09/09/22  Tessa Lerner, DO    Critical care time: 33 minutes   The patient is critically ill with multiple organ system failure and requires high complexity decision making for assessment and support, frequent evaluation and titration of therapies, advanced monitoring, review of radiographic studies and interpretation of complex data.   Critical Care Time devoted to patient care services, exclusive of separately billable procedures, described in this note is 35 minutes.  Omar Person, MD 08/30/22 1:48 PM   From 7A-7P if no response, please call [phone] After hours, please call ELink [phone]

## 2022-08-31 ENCOUNTER — Inpatient Hospital Stay (HOSPITAL_COMMUNITY): Payer: No Typology Code available for payment source

## 2022-08-31 DIAGNOSIS — R609 Edema, unspecified: Secondary | ICD-10-CM

## 2022-08-31 DIAGNOSIS — T17500A Unspecified foreign body in bronchus causing asphyxiation, initial encounter: Secondary | ICD-10-CM

## 2022-08-31 DIAGNOSIS — J9601 Acute respiratory failure with hypoxia: Secondary | ICD-10-CM | POA: Diagnosis not present

## 2022-08-31 DIAGNOSIS — I610 Nontraumatic intracerebral hemorrhage in hemisphere, subcortical: Secondary | ICD-10-CM | POA: Diagnosis not present

## 2022-08-31 LAB — BASIC METABOLIC PANEL
Anion gap: 9 (ref 5–15)
BUN: 35 mg/dL — ABNORMAL HIGH (ref 8–23)
CO2: 23 mmol/L (ref 22–32)
Calcium: 8.7 mg/dL — ABNORMAL LOW (ref 8.9–10.3)
Chloride: 121 mmol/L — ABNORMAL HIGH (ref 98–111)
Creatinine, Ser: 1.27 mg/dL — ABNORMAL HIGH (ref 0.61–1.24)
GFR, Estimated: >60 mL/min (ref >60)
Glucose, Bld: 147 mg/dL — ABNORMAL HIGH (ref 70–99)
Potassium: 3.3 mmol/L — ABNORMAL LOW (ref 3.5–5.1)
Sodium: 153 mmol/L — ABNORMAL HIGH (ref 135–145)

## 2022-08-31 LAB — CULTURE, RESPIRATORY W GRAM STAIN

## 2022-08-31 LAB — CBC
HCT: 33.9 % — ABNORMAL LOW (ref 39.0–52.0)
Hemoglobin: 11.1 g/dL — ABNORMAL LOW (ref 13.0–17.0)
MCH: 28.3 pg (ref 26.0–34.0)
MCHC: 32.7 g/dL (ref 30.0–36.0)
MCV: 86.5 fL (ref 80.0–100.0)
Platelets: 180 10*3/uL (ref 150–400)
RBC: 3.92 MIL/uL — ABNORMAL LOW (ref 4.22–5.81)
RDW: 18.8 % — ABNORMAL HIGH (ref 11.5–15.5)
WBC: 7.7 10*3/uL (ref 4.0–10.5)
nRBC: 0 % (ref 0.0–0.2)

## 2022-08-31 LAB — SODIUM
Sodium: 151 mmol/L — ABNORMAL HIGH (ref 135–145)
Sodium: 152 mmol/L — ABNORMAL HIGH (ref 135–145)
Sodium: 151 mmol/L — ABNORMAL HIGH (ref 135–145)

## 2022-08-31 LAB — GLUCOSE, CAPILLARY
Glucose-Capillary: 151 mg/dL — ABNORMAL HIGH (ref 70–99)
Glucose-Capillary: 140 mg/dL — ABNORMAL HIGH (ref 70–99)
Glucose-Capillary: 112 mg/dL — ABNORMAL HIGH (ref 70–99)
Glucose-Capillary: 91 mg/dL (ref 70–99)
Glucose-Capillary: 116 mg/dL — ABNORMAL HIGH (ref 70–99)
Glucose-Capillary: 119 mg/dL — ABNORMAL HIGH (ref 70–99)

## 2022-08-31 LAB — CULTURE, BLOOD (ROUTINE X 2)

## 2022-08-31 MED ORDER — ETOMIDATE 2 MG/ML IV SOLN
INTRAVENOUS | Status: AC
  Filled 2022-08-31: qty 10

## 2022-08-31 MED ORDER — LABETALOL HCL 5 MG/ML IV SOLN
20.0000 mg | INTRAVENOUS | Status: DC | PRN
  Administered 2022-08-31 – 2022-09-05 (×9): 20 mg via INTRAVENOUS
  Filled 2022-08-31 (×8): qty 4

## 2022-08-31 MED ORDER — ROCURONIUM BROMIDE 50 MG/5ML IV SOLN
80.0000 mg | Freq: Once | INTRAVENOUS | Status: AC
  Administered 2022-08-31: 80 mg via INTRAVENOUS

## 2022-08-31 MED ORDER — SODIUM CHLORIDE 0.9% FLUSH
10.0000 mL | Freq: Two times a day (BID) | INTRAVENOUS | Status: DC
  Administered 2022-08-31 – 2022-09-04 (×9): 10 mL
  Administered 2022-09-04 – 2022-09-05 (×2): 20 mL

## 2022-08-31 MED ORDER — POTASSIUM CHLORIDE 10 MEQ/100ML IV SOLN
10.0000 meq | INTRAVENOUS | Status: AC
  Administered 2022-08-31 (×4): 10 meq via INTRAVENOUS
  Filled 2022-08-31 (×4): qty 100

## 2022-08-31 MED ORDER — ROCURONIUM BROMIDE 10 MG/ML (PF) SYRINGE
PREFILLED_SYRINGE | INTRAVENOUS | Status: AC
  Filled 2022-08-31: qty 10

## 2022-08-31 MED ORDER — MIDAZOLAM HCL 2 MG/2ML IJ SOLN
4.0000 mg | Freq: Once | INTRAMUSCULAR | Status: AC
  Administered 2022-08-31: 4 mg via INTRAVENOUS

## 2022-08-31 MED ORDER — ROCURONIUM BROMIDE 10 MG/ML (PF) SYRINGE
50.0000 mg | PREFILLED_SYRINGE | Freq: Once | INTRAVENOUS | Status: AC
  Administered 2022-08-31: 50 mg via INTRAVENOUS

## 2022-08-31 MED ORDER — SODIUM CHLORIDE 0.9 % IV BOLUS
1000.0000 mL | Freq: Once | INTRAVENOUS | Status: AC
  Administered 2022-08-31: 1000 mL via INTRAVENOUS

## 2022-08-31 MED ORDER — MIDAZOLAM HCL 2 MG/2ML IJ SOLN
INTRAMUSCULAR | Status: AC
  Filled 2022-08-31: qty 4

## 2022-08-31 MED ORDER — SODIUM CHLORIDE 0.9% FLUSH: 10.0000 mL | INTRAVENOUS | Status: DC | PRN

## 2022-08-31 MED ORDER — ETOMIDATE 2 MG/ML IV SOLN
20.0000 mg | Freq: Once | INTRAVENOUS | Status: AC
  Administered 2022-08-31: 20 mg via INTRAVENOUS

## 2022-08-31 MED ORDER — POTASSIUM CHLORIDE 20 MEQ PO PACK
20.0000 meq | PACK | ORAL | Status: AC
  Administered 2022-08-31 (×2): 20 meq
  Filled 2022-08-31 (×2): qty 1

## 2022-08-31 NOTE — Progress Notes (Signed)
eLink Physician-Brief Progress Note Patient Name: Jimmy Marshall DOB: [birth year] MRN: [id number]   Date of Service  08/31/2022  HPI/Events of Note  Notified of jerking movements.   On camera assessment, pt appears to have myoclonic jerking that is intermittent.  Pt is a 66/M with ICH and hydrocephalus s/p EVD placement.  Pt is intubated and sedated on propofol, fentanyl and versed PRN.   Pt is also on keppra.  eICU Interventions  Recommend to have repeat EEG.  Consult teleneurology tonight.      Intervention Category Major Interventions: Other:  [name] 08/31/2022, 11:05 PM

## 2022-08-31 NOTE — Progress Notes (Signed)
STROKE TEAM PROGRESS NOTE   INTERVAL HISTORY No family at the bedside. Still unresponsive. Intubated on EVD, no significant neuro change or improvement.    Vitals:   08/31/22 0810 08/31/22 0900 08/31/22 0930 08/31/22 1000  BP: (!) 149/92 (!) 155/99 136/88 (!) 167/102  Pulse: 80 75 81 79  Resp: 18 (!) 24 17 17   Temp:      TempSrc:      SpO2: 97% 99% 96% 98%  Weight:      Height:       CBC:  Recent Labs  Lab 08/26/22 1420 08/26/22 1614 08/30/22 0541 08/31/22 0404  WBC 11.7*   < > 7.6 7.7  NEUTROABS 10.8*  --   --   --   HGB 12.7*   < > 11.3* 11.1*  HCT 41.8   < > 36.0* 33.9*  MCV 89.9   < > 87.2 86.5  PLT 195   < > 191 180   < > = values in this interval not displayed.   Basic Metabolic Panel:  Recent Labs  Lab 08/28/22 0155 08/28/22 0808 08/28/22 1644 08/28/22 1935 08/30/22 0541 08/30/22 0813 08/31/22 0404 08/31/22 0730  NA 156*   < >  --    < > 156*   < > 153* 151*  K 3.6  --   --    < > 3.1*  --  3.3*  --   CL 130*  --   --    < > 123*  --  121*  --   CO2 20*  --   --    < > 22  --  23  --   GLUCOSE 104*  --   --    < > 126*  --  147*  --   BUN 16  --   --    < > 25*  --  35*  --   CREATININE 0.96  --   --    < > 1.11  --  1.27*  --   CALCIUM 8.0*  --   --    < > 9.1  --  8.7*  --   MG 2.3  --  2.7*  --   --   --   --   --   PHOS 2.1*  --  2.6  --  3.4  --   --   --    < > = values in this interval not displayed.   Lipid Panel:  Recent Labs  Lab 08/27/22 0208 08/30/22 0541  CHOL 155  --   TRIG 105  114 160*  HDL 33*  --   CHOLHDL 4.7  --   VLDL 21  --   LDLCALC 101*  --    HgbA1c:  Recent Labs  Lab 08/27/22 0208  HGBA1C 6.0*   Urine Drug Screen:  Recent Labs  Lab 08/29/22 1018  LABOPIA NONE DETECTED  COCAINSCRNUR NONE DETECTED  LABBENZ NONE DETECTED  AMPHETMU NONE DETECTED  THCU POSITIVE*  LABBARB NONE DETECTED    Alcohol Level No results for input(s): "ETH" in the last 168 hours.  IMAGING past 24 hours [place] EKG SITE  RITE  Result Date: 08/31/2022 If Site Rite image not attached, placement could not be confirmed due to current cardiac rhythm.  CT HEAD WO CONTRAST ( )  Result Date: 08/31/2022 CLINICAL DATA:  Hemorrhagic stroke EXAM: CT HEAD WITHOUT CONTRAST TECHNIQUE: Contiguous axial images were obtained from the base of the skull through the vertex without intravenous  contrast. RADIATION DOSE REDUCTION: This exam was performed according to the departmental dose-optimization program which includes automated exposure control, adjustment of the mA and/or kV according to patient size and/or use of iterative reconstruction technique. COMPARISON:  08/27/2022 FINDINGS: Brain: Unchanged appearance of left thalamic intraparenchymal hematoma with large degree of intraventricular extension. Unchanged rightward midline shift approximately 6 mm. Hydrocephalus has improved following EVD placement. Vascular: No hyperdense vessel or unexpected calcification. Skull: Right frontal burr hole Sinuses/Orbits: Worsened paranasal sinus opacification. Other: None IMPRESSION: 1. Unchanged appearance of left thalamic intraparenchymal hematoma with large degree of intraventricular extension. 2. Hydrocephalus has improved following EVD placement. 3. Worsened paranasal sinus opacification. Electronically Signed   By: Deatra Robinson M.D.   On: 08/31/2022 01:35    PHYSICAL EXAM  Temp:  [99.3 F (37.4 C)-102.1 F (38.9 C)] 99.3 F (37.4 C) (05/27 0800) Pulse Rate:  [75-117] 79 (05/27 1000) Resp:  [15-31] 17 (05/27 1000) BP: (117-174)/(77-118) 167/102 (05/27 1000) SpO2:  [93 %-100 %] 98 % (05/27 1000) Arterial Line BP: (173)/(85) 173/85 (05/26 1100) FiO2 (%):  [40 %] 40 % (05/27 0754)  General - Well nourished, well developed, intubated on sedation.  Ophthalmologic - fundi not visualized due to noncooperation.  Cardiovascular - Regular rhythm and rate  Neuro - intubated on propofol and fentanyl, eyes closed, not following commands.  With forced eye opening, left eye in mid position, right eye inward gaze, not blinking to visual threat, doll's eyes sluggish, not tracking, b/l pupil 2mm, sluggish to light. Corneal reflex weakly present bilaterally, gag and cough present. Breathing over the vent.  Facial symmetry not able to test due to ET tube.  Tongue protrusion not cooperative. On pain stimulation, left UE mild withdraw and LLE slight withdraw however, both LUE and LLE have spontaneous movement. RUE and RLE flaccid. Sensation, coordination and gait not tested.    ASSESSMENT/PLAN [name] is a 67 y.o. 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 S/p EVD, Ventricular system may be slightly smaller but otherwise no significant improvement. Mild new rightward midline shift, 2-3 mm CTA head & neck no AVM or aneurysm CT repeat showed stable left thalamic ICH with large IVH.  Hydrocephalus has improved after EVD placement. MRI pending 2D Echo LVEF 50-55% EEG no seizure LDL 101 HgbA1c 5.6  UDS - positive for THC EEG suggestive of severe diffuse encephalopathy of nonspecific etiology though likely related to sedation.  No seizures or epileptiform discharges seen throughout recording. VTE prophylaxis - heparin subq No antithrombotic  prior to admission, now on No antithrombotic due to IPH On keppra for 7 days for seizure prevention Therapy recommendations:  pending Disposition:  pending  Hydrocephalus s/p EVD placement Cerebral edema On HTS 3% s/p 250 mL bolus, 75 cc/h gtt -> off Na 148 > 152->156->153 ->152 Off free water CT repeat showed hydrocephalus has improved  Respiratory failure Fever ?  Aspiration pneumonia CCM on  board for vent management Tmax 102.1 WBC 7.7 On Unasyn  Hypertensive emergency Home meds:  losartan, diltiazem Stable now on cleviprex gtt On losartan 100, amlodipine 10, and spironolactone 25 PRN labetalol and hydralazine for hypertension  Blood pressure goal systolic < 160 Long-term BP goal normotensive  Hyperlipidemia Home meds: None LDL 101, goal < 70 Consider adding statin at discharge  Other Stroke Risk Factors Substance abuse - UDS:  THC POSITIVE. Patient will be advised to stop using due to stroke risk.  Other Active Problems Schizoaffective disorder, depressive type On Zyprexa Glaucoma Aortic aneurysm   Hospital day # 5  This patient is critically ill due to ICH, IVH and  hydrocephalus, respiratory failure, hypertensive emergency and at significant risk of neurological worsening, death form hematoma expansion, brain herniation, obstructive hydrocephalus, status epilepticus. This patient's care requires constant monitoring of vital signs, hemodynamics, respiratory and cardiac monitoring, review of multiple databases, neurological assessment, discussion with family, other specialists and medical decision making of high complexity. I spent 35 minutes of neurocritical care time in the care of this patient. I discussed with Dr. Danielle Dess.  Marvel Plan, MD PhD Stroke Neurology 08/31/2022 10:48 AM   To contact Stroke Continuity provider, please refer to [email or web address]. After hours, contact General Neurology

## 2022-08-31 NOTE — Progress Notes (Signed)
Pt transported to CT1 and back to 4N17 on vent with myself and 2 RN without incident.

## 2022-08-31 NOTE — Progress Notes (Signed)
Patient ID: Jimmy Marshall, male   DOB: 1955-09-30, 67 y.o.   MRN: [id number] Vital signs remained stable while patient on the ventilator He does not tolerate weaning well IVC continues to drain CT reviewed demonstrates catheter in good position Continue to monitor IVC drainage

## 2022-08-31 NOTE — Progress Notes (Signed)
NAME:  Jimmy Marshall, MRN:  [id number], DOB:  [birth year], LOS: 5 ADMISSION DATE:  08/26/2022 CONSULTATION DATE:  08/26/2022 REFERRING MD:  Kommor - EDP CHIEF COMPLAINT:  AMS, ICH   History of Present Illness:  67 year old man who presented to [facility] ED 5/22 after being found down at a hotel.  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  Patient is intubated and sedated, therefore history is obtained from chart review.  Per EMS, patient is currently residing in a hotel and was found down by staff who went to collect rent from patient.  Found unresponsive, RR 4 and Narcan 1mg  (IN) and 3mg  IV given with no response.  Intubated on arrival to ED. CXR notable for left greater than right atelectasis.  Per EDP, BP very labile; hypertensive on admission then dropping BP to SBP 70s requiring Neo pushes.  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  Neurosurgery was consulted.  CTA Chest/A/P negative for aortic dissection, overall unchanged enlargement of distal aortic arch/ascending thoracic aorta.  PCCM consulted for ICU admission.  Pertinent Medical History:   Past Medical History:  Diagnosis Date   GERD (gastroesophageal reflux disease)    Glaucoma    Hypertension    Schizoaffective disorder, depressive type (HCC)    "dx'd in the 1990's"   Schizophrenia, schizo-affective (HCC)    Significant Hospital Events: Including procedures, antibiotic start and stop dates in addition to other pertinent events   5/22 -presented via EMS after being found down at hotel.  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 Neuro/Stroke consulted. Neurosurgery consulted. EVD placement. 5/24 3%NS held for elevated Na and Cl, CT imaging shows non-worsening hematoma  Interim  History / Subjective:  No change in exam  CTH stable  Little urine output  Objective:  Blood pressure (!) 149/92, pulse 80, temperature 99.3 F (37.4 C), temperature source Oral, resp. rate 18, height 5\' 11"  (1.803 m), weight 93.9 kg, SpO2 97 %.    Vent Mode: PRVC FiO2 (%):  [40 %] 40 % Set Rate:  [18 bmp] 18 bmp Vt Set:  [600 mL] 600 mL PEEP:  [5 cmH20] 5 cmH20 Plateau Pressure:  [12 cmH20-15 cmH20] 15 cmH20   Intake/Output Summary (Last 24 hours) at 08/31/2022 0951 Last data filed at 08/31/2022 0900 Gross per 24 hour  Intake 2515.09 ml  Output 1589 ml  Net 926.09 ml   Filed Weights   08/27/22 0500 08/28/22 0500 08/29/22 0426  Weight: 88.7 kg 92.8 kg 93.9 kg   Physical Examination: Intubated, sedated Mech breath sounds bl, equal chest rise Moving LUE, LLE nonpurposefully Pupils equal sluggish  Labs/imaging reviewed   Assessment & Plan:  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 - NSGY following for EVD - Neuro/Stroke following - pain control and cleviprex SBP 120-160 per neuro - S/p Keppra load, AEDs per Neuro - Frequent neuro checks - At goal Na 150-155, HTS held - Neuroprotective measures: HOB > 30 degrees, normoglycemia, normothermia, electrolytes WNL - PT/OT/SLP when able to participate in care  Acute hypoxemic respiratory failure Aspiration pneumonia - Continue full vent support, daily SAT/SBT as able  - Wean FiO2 for O2 sat > 90% - VAP bundle - tracheal aspirate, unasyn tentative 5d course - Pulmonary hygiene - continue CPT, HTS nebs - wean fentanyl, propofol for rass 0 to -1  RUE swelling -  PIVs removed - arterial line removed, RUE is warm - check US DVT RUE  Thoracic aortic intramural hematoma and descending thoracic aortic aneurysm HTN Noted on scan 08/2021 4.6 cm. - SBP goal per above - Resume home medications as appropriate  Schizoaffective disorder/schizophrenia - Resume home Zyprexa, Remeron as clinically  appropriate  GERD - PPI  At risk for hyperglycemia - Hold home metformin - SSI - CBGs Q4H - Goal CBG 140-180  Best Practice: (right click and "Reselect all SmartList Selections" daily)   Diet/type: tubefeeds DVT prophylaxis: LMWH GI prophylaxis: PPI Lines: N/A Foley:  N/A Code Status:  full code Last date of multidisciplinary goals of care discussion 5/26 pt's daughter updated at bedside  Labs:  CBC: Recent Labs  Lab 08/26/22 1420 08/26/22 1614 08/27/22 0208 08/27/22 0234 08/28/22 0155 08/29/22 0439 08/30/22 0541 08/31/22 0404  WBC 11.7*  --  9.6  --  9.5 6.0 7.6 7.7  NEUTROABS 10.8*  --   --   --   --   --   --   --   HGB 12.7*   < > 11.4* 11.9* 10.2* 10.5* 11.3* 11.1*  HCT 41.8   < > 35.8* 35.0* 32.5* 34.9* 36.0* 33.9*  MCV 89.9  --  85.6  --  87.1 91.8 87.2 86.5  PLT 195  --  220  --  178 171 191 180   < > = values in this interval not displayed.   Basic Metabolic Panel: Recent Labs  Lab 08/27/22 0208 08/27/22 0234 08/27/22 0737 08/27/22 1441 08/27/22 2021 08/28/22 0155 08/28/22 0808 08/28/22 1644 08/28/22 1935 08/29/22 0439 08/29/22 0815 08/30/22 0541 08/30/22 0813 08/30/22 1440 08/30/22 2011 08/31/22 0404 08/31/22 0730  NA 148* 152*   < >  --    < > 156*   < >  --    < > 157*   < > 156* 155* 152* 153* 153* 151*  K 3.5 3.4*  --   --   --  3.6  --   --   --  3.8  --  3.1*  --   --   --  3.3*  --   CL 119*  --   --   --   --  130*  --   --   --  129*  --  123*  --   --   --  121*  --   CO2 20*  --   --   --   --  20*  --   --   --  21*  --  22  --   --   --  23  --   GLUCOSE 111*  --   --   --   --  104*  --   --   --  153*  --  126*  --   --   --  147*  --   BUN 14  --   --   --   --  16  --   --   --  26*  --  25*  --   --   --  35*  --   CREATININE 1.08  --   --   --   --  0.96  --   --   --  1.09  --  1.11  --   --   --  1.27*  --   CALCIUM 8.6*  --   --   --   --  8.0*  --   --   --  8.8*  --  9.1  --   --   --  8.7*  --   MG 2.1  --   --   2.2  --  2.3  --  2.7*  --   --   --   --   --   --   --   --   --   PHOS 1.4*  --   --  2.5  --  2.1*  --  2.6  --   --   --  3.4  --   --   --   --   --    < > = values in this interval not displayed.   GFR: Estimated Creatinine Clearance: 68.7 mL/min (A) (by C-G formula based on SCr of 1.27 mg/dL (H)). Recent Labs  Lab 08/26/22 1311 08/26/22 1420 08/26/22 1421 08/26/22 1510 08/27/22 0208 08/28/22 0155 08/29/22 0439 08/30/22 0541 08/31/22 0404  PROCALCITON  --   --   --  0.18  --   --   --   --   --   WBC  --    < >  --   --    < > 9.5 6.0 7.6 7.7  LATICACIDVEN 4.3*  --  4.4*  --   --   --   --   --   --    < > = values in this interval not displayed.    Liver Function Tests: Recent Labs  Lab 08/26/22 1420  AST 24  ALT 18  ALKPHOS 52  BILITOT 0.8  PROT 7.0  ALBUMIN 3.9   No results for input(s): "LIPASE", "AMYLASE" in the last 168 hours. No results for input(s): "AMMONIA" in the last 168 hours.  ABG:    Component Value Date/Time   PHART 7.459 (H) 08/27/2022 0234   PCO2ART 25.1 (L) 08/27/2022 0234   PO2ART 124 (H) 08/27/2022 0234   HCO3 17.7 (L) 08/27/2022 0234   TCO2 18 (L) 08/27/2022 0234   ACIDBASEDEF 4.0 (H) 08/27/2022 0234   O2SAT 99 08/27/2022 0234    Coagulation Profile: No results for input(s): "INR", "PROTIME" in the last 168 hours.  Cardiac Enzymes: No results for input(s): "CKTOTAL", "CKMB", "CKMBINDEX", "TROPONINI" in the last 168 hours.  HbA1C: Hgb A1c MFr Bld  Date/Time Value Ref Range Status  08/27/2022 02:08 AM 6.0 (H) 4.8 - 5.6 % Final    Comment:    (NOTE)         Prediabetes: 5.7 - 6.4         Diabetes: >6.4         Glycemic control for adults with diabetes: <7.0   08/26/2022 04:06 PM 5.6 4.8 - 5.6 % Final    Comment:    (NOTE) Pre diabetes:          5.7%-6.4%  Diabetes:              >6.4%  Glycemic control for   <7.0% adults with diabetes    CBG: Recent Labs  Lab 08/30/22 1534 08/30/22 1921 08/30/22 2306  08/31/22 0312 08/31/22 0744  GLUCAP 160* 147* 117* 151* 140*    Review of Systems:   Patient is encephalopathic and/or intubated; therefore, history has been obtained from chart review.   Past Medical History:  He,  has a past medical history of GERD (gastroesophageal reflux disease), Glaucoma, Hypertension, Schizoaffective disorder, depressive type (HCC), and Schizophrenia, schizo-affective (HCC).   Surgical  History:   Past Surgical History:  Procedure Laterality Date   FRACTURE SURGERY  1996   jaw    INCISION AND DRAINAGE / EXCISION THYROGLOSSAL CYST  06/30/11   w/central hyoid resection   THYROGLOSSAL DUCT CYST  06/30/2011   Procedure: THYROGLOSSAL DUCT CYST;  Surgeon: Osborn Coho, MD;  Location: [facility] OR;  Service: ENT;  Laterality: N/A;  excision of thyroglossal duct cyst   Social History:   reports that he has never smoked. He has never used smokeless tobacco. He reports current alcohol use. He reports current drug use. Drugs: Marijuana and Cocaine.   Family History:  His family history includes Cancer in his father; Heart attack in his mother; Hypertension in his mother.   Allergies: No Known Allergies   Home Medications: Prior to Admission medications   Medication Sig Start Date End Date Taking? Authorizing Provider  diltiazem (CARTIA XT) 240 MG 24 hr capsule Take 1 capsule (240 mg total) by mouth every morning. 08/10/22 09/09/22  Tolia, Sunit, DO  losartan (COZAAR) 100 MG tablet Take 1 tablet (100 mg total) by mouth daily at 10 pm. 08/10/22 09/09/22  Tolia, Sunit, DO  metFORMIN (GLUCOPHAGE) 500 MG tablet Take 500 mg by mouth 2 (two) times daily with a meal.    [provider]  mirtazapine (REMERON) 15 MG tablet Take 15 mg by mouth at bedtime. 02/27/20   [provider]  OLANZapine (ZYPREXA) 10 MG tablet Take 10 mg by mouth at bedtime.    [provider]  OLANZapine (ZYPREXA) 20 MG tablet Take 20 mg by mouth at bedtime.    [provider]   spironolactone (ALDACTONE) 25 MG tablet Take 1 tablet (25 mg total) by mouth every morning. 08/10/22 09/09/22  Tessa Lerner, DO   Critical care time: 35 minutes   The patient is critically ill with multiple organ system failure and requires high complexity decision making for assessment and support, frequent evaluation and titration of therapies, advanced monitoring, review of radiographic studies and interpretation of complex data.   Critical Care Time devoted to patient care services, exclusive of separately billable procedures, described in this note is 35 minutes.  Omar Person, MD 08/31/22 9:51 AM   From 7A-7P if no response, please call [phone] After hours, please call ELink [phone]

## 2022-08-31 NOTE — Plan of Care (Signed)
Called by patient bedside RN-patient had episode of what looked like myoclonic jerking.  Witnessed by PCCM E-Link MD on camera and requesting EEG. Ordered a routine EEG to be done at some point tonight. Will continue to follow.  -- Milon Dikes, MD Neurologist [facility] Pager: [phone]

## 2022-08-31 NOTE — Procedures (Signed)
Bronchoscopy Procedure Note  Jimmy Marshall  [id number]  [birth year]  Date:08/31/22  Time:5:20 PM   Provider Performing:Jake Goodson M Thora Lance   Procedure(s):  Flexible Bronchoscopy [phone]) and Initial Therapeutic Aspiration of Tracheobronchial Tree [phone])  Indication(s) Suspected mucus plugging  Consent Risks of the procedure as well as the alternatives and risks of each were explained to the patient and/or caregiver.  Consent for the procedure was obtained and is signed in the bedside chart  Anesthesia See MAR for details   Time Out Verified patient identification, verified procedure, site/side was marked, verified correct patient position, special equipment/implants available, medications/allergies/relevant history reviewed, required imaging and test results available.   Sterile Technique Usual hand hygiene, masks, gowns, and gloves were used   Procedure Description Bronchoscope advanced through endotracheal tube and into airway.     Findings:  - inspissated mucus forming cast occluding ETT. 2mm forceps used to retrieve it. Once cast removed distal end of ETT visualized appearing stenotic from dried secretions, unable to pass 6mm ambuscope. Decision made to remove current ETT and reintubate - see separate procedure note. Following reintubation, airways examined to subsegmental level without significant burden of secretions. ETT secured in place at 24cm at the teeth.    Complications/Tolerance None; patient tolerated the procedure well. Chest X-ray is needed post procedure.   EBL Minimal   Specimen(s) None

## 2022-08-31 NOTE — Progress Notes (Signed)
[facility] ADULT ICU REPLACEMENT PROTOCOL   The patient does apply for the [facility] Adult ICU Electrolyte Replacment Protocol based on the criteria listed below:   1.Exclusion criteria: TCTS, ECMO, Dialysis, and Myasthenia Gravis patients 2. Is GFR >/= 30 ml/min? Yes.    Patient's GFR today is > 60 3. Is SCr </= 2? Yes.   Patient's SCr is  1.27 mg/dL 4. Did SCr increase >/= 0.5 in 24 hours? No. 5.Pt's weight >40kg  Yes.   6. Abnormal electrolyte(s): K+ 3.3  7. Electrolytes replaced per protocol 8.  Call MD STAT for K+ </= 2.5, Phos </= 1, or Mag </= 1 Physician:  Dr Cory Roughen, Jettie Booze 08/31/2022 6:10 AM

## 2022-08-31 NOTE — Progress Notes (Addendum)
1500 Patient HR briefly to 30s, recovered without intervention by the time RN entered room immediately--patient with mucus plug when RN attempted to ijnline suction initially. MD Thora Lance made aware, CXR ordered.  SBP increased 180s after event, PRN given. Recheck with SBP 200. 2nd PRN given without relief; clev gtt initiated. Neuro status unchanged. MD aware and to bedside.   Bronch performed with consent, from findings, converted to tube exchange. SpO2 low 90s after procedure, SpO2 goal 90-95% at this time per MD Meier . Daughter updated over phone by MD.

## 2022-08-31 NOTE — Progress Notes (Signed)
Peripherally Inserted Central Catheter Placement  The IV Nurse has discussed with the patient and/or persons authorized to consent for the patient, the purpose of this procedure and the potential benefits and risks involved with this procedure.  The benefits include less needle sticks, lab draws from the catheter, and the patient may be discharged home with the catheter. Risks include, but not limited to, infection, bleeding, blood clot (thrombus formation), and puncture of an artery; nerve damage and irregular heartbeat and possibility to perform a PICC exchange if needed/ordered by physician.  Alternatives to this procedure were also discussed.  Bard Power PICC patient education guide, fact sheet on infection prevention and patient information card has been provided to patient /or left at bedside.    PICC Placement Documentation  PICC Double Lumen 08/31/22 Left Basilic 47 cm 0 cm (Active)  Indication for Insertion or Continuance of Line Limited venous access - need for IV therapy >5 days (PICC only);Vasoactive infusions 08/31/22 1008  Exposed Catheter (cm) 0 cm 08/31/22 1008  Site Assessment Clean, Dry, Intact 08/31/22 1008  Lumen #1 Status Flushed;Saline locked;Blood return noted 08/31/22 1008  Lumen #2 Status Flushed;Saline locked;Blood return noted 08/31/22 1008  Dressing Type Transparent;Securing device 08/31/22 1008  Dressing Status Antimicrobial disc in place;Clean, Dry, Intact 08/31/22 1008  Safety Lock Intact 08/31/22 1008  Line Adjustment (NICU/IV Team Only) No 08/31/22 1008  Dressing Intervention New dressing;Other (Comment) 08/31/22 1008  Dressing Change Due 09/07/22 08/31/22 1008   Telephone consent signed by Daughter    Jimmy Marshall 08/31/2022, 10:10 AM

## 2022-08-31 NOTE — TOC CM/SW Note (Signed)
Transition of Care [facility]) - Inpatient Brief Assessment   Patient Details  Name: Jimmy Marshall MRN: [id number] Date of Birth: April 14, 1955  Transition of Care [facility]) CM/SW Contact:    Glennon Mac, RN Phone Number: 08/31/2022, 2:31 PM   Clinical Narrative: 34M admitted for intraparenchymal and interventricular hemorrhage with hydrocephalus after being found down in a hotel room. Currently remains intubated; he is not following commands or weaning from the ventilator.  TOC will continue to follow as patient progresses.    Transition of Care Asessment: Insurance and Status: Insurance coverage has been reviewed Patient has primary care physician: Yes Orvilla Cornwall) Home environment has been reviewed: Lives in a hotel Prior level of function:: Independent Prior/Current Home Services: No current home services Social Determinants of Health Reivew: SDOH reviewed no interventions necessary Readmission risk has been reviewed: Yes Transition of care needs: transition of care needs identified, TOC will continue to follow   Quintella Baton, RN, BSN  Trauma/Neuro ICU Case Manager [phone]

## 2022-08-31 NOTE — Procedures (Signed)
Intubation Procedure Note  Yazn Etue  [id number]  17-May-1955  Date:08/31/22  Time:5:26 PM   Provider Performing:Brihanna Devenport M Thora Lance    Procedure: Intubation (31500)  Indication(s) Respiratory Failure  Consent Risks of the procedure as well as the alternatives and risks of each were explained to the patient and/or caregiver.  Consent for the procedure was obtained and is signed in the bedside chart   Anesthesia Etomidate and Rocuronium   Time Out Verified patient identification, verified procedure, site/side was marked, verified correct patient position, special equipment/implants available, medications/allergies/relevant history reviewed, required imaging and test results available.   Sterile Technique Usual hand hygeine, masks, and gloves were used   Procedure Description Patient positioned in bed supine.  Sedation given as noted above.  Patient was intubated with endotracheal tube using Glidescope.  View was Grade 2b, significant epiglottic and arytenoid edema.  Number of attempts was 1.  Colorimetric CO2 detector was consistent with tracheal placement.   Complications/Tolerance None; patient tolerated the procedure well. Chest X-ray is ordered to verify placement.   EBL Minimal   Specimen(s) None

## 2022-09-01 ENCOUNTER — Inpatient Hospital Stay (HOSPITAL_COMMUNITY): Payer: No Typology Code available for payment source

## 2022-09-01 ENCOUNTER — Inpatient Hospital Stay: Payer: Self-pay

## 2022-09-01 DIAGNOSIS — G911 Obstructive hydrocephalus: Secondary | ICD-10-CM | POA: Diagnosis not present

## 2022-09-01 DIAGNOSIS — R569 Unspecified convulsions: Secondary | ICD-10-CM | POA: Diagnosis not present

## 2022-09-01 DIAGNOSIS — I610 Nontraumatic intracerebral hemorrhage in hemisphere, subcortical: Secondary | ICD-10-CM | POA: Diagnosis not present

## 2022-09-01 LAB — BASIC METABOLIC PANEL
Anion gap: 8 (ref 5–15)
BUN: 29 mg/dL — ABNORMAL HIGH (ref 8–23)
CO2: 23 mmol/L (ref 22–32)
Calcium: 8 mg/dL — ABNORMAL LOW (ref 8.9–10.3)
Chloride: 119 mmol/L — ABNORMAL HIGH (ref 98–111)
Creatinine, Ser: 1.06 mg/dL (ref 0.61–1.24)
GFR, Estimated: >60 mL/min (ref >60)
Glucose, Bld: 177 mg/dL — ABNORMAL HIGH (ref 70–99)
Potassium: 3.6 mmol/L (ref 3.5–5.1)
Sodium: 150 mmol/L — ABNORMAL HIGH (ref 135–145)

## 2022-09-01 LAB — CBC
HCT: 33.8 % — ABNORMAL LOW (ref 39.0–52.0)
Hemoglobin: 10.6 g/dL — ABNORMAL LOW (ref 13.0–17.0)
MCH: 27.2 pg (ref 26.0–34.0)
MCHC: 31.4 g/dL (ref 30.0–36.0)
MCV: 86.9 fL (ref 80.0–100.0)
Platelets: 178 10*3/uL (ref 150–400)
RBC: 3.89 MIL/uL — ABNORMAL LOW (ref 4.22–5.81)
RDW: 18.6 % — ABNORMAL HIGH (ref 11.5–15.5)
WBC: 8.7 10*3/uL (ref 4.0–10.5)
nRBC: 0 % (ref 0.0–0.2)

## 2022-09-01 LAB — GLUCOSE, CAPILLARY
Glucose-Capillary: 102 mg/dL — ABNORMAL HIGH (ref 70–99)
Glucose-Capillary: 135 mg/dL — ABNORMAL HIGH (ref 70–99)
Glucose-Capillary: 126 mg/dL — ABNORMAL HIGH (ref 70–99)
Glucose-Capillary: 136 mg/dL — ABNORMAL HIGH (ref 70–99)
Glucose-Capillary: 214 mg/dL — ABNORMAL HIGH (ref 70–99)
Glucose-Capillary: 154 mg/dL — ABNORMAL HIGH (ref 70–99)

## 2022-09-01 LAB — MAGNESIUM: Magnesium: 2.7 mg/dL — ABNORMAL HIGH (ref 1.7–2.4)

## 2022-09-01 MED ORDER — CARVEDILOL 3.125 MG PO TABS
6.2500 mg | ORAL_TABLET | Freq: Two times a day (BID) | ORAL | Status: DC
  Administered 2022-09-01 (×2): 6.25 mg
  Filled 2022-09-01 (×2): qty 2

## 2022-09-01 MED ORDER — BACLOFEN 10 MG PO TABS
10.0000 mg | ORAL_TABLET | Freq: Three times a day (TID) | ORAL | Status: DC
  Administered 2022-09-01 – 2022-09-05 (×13): 10 mg
  Filled 2022-09-01 (×15): qty 1

## 2022-09-01 MED ORDER — POTASSIUM CHLORIDE 20 MEQ PO PACK
40.0000 meq | PACK | Freq: Once | ORAL | Status: AC
  Administered 2022-09-01: 40 meq
  Filled 2022-09-01: qty 2

## 2022-09-01 MED ORDER — MIDAZOLAM HCL 2 MG/2ML IJ SOLN
1.0000 mg | INTRAMUSCULAR | Status: DC | PRN
  Administered 2022-09-01: 2 mg via INTRAVENOUS

## 2022-09-01 MED ORDER — GUAIFENESIN 100 MG/5ML PO LIQD
15.0000 mL | ORAL | Status: DC
  Administered 2022-09-01 – 2022-09-05 (×25): 15 mL
  Filled 2022-09-01 (×25): qty 15

## 2022-09-01 MED ORDER — ALBUTEROL SULFATE (2.5 MG/3ML) 0.083% IN NEBU: 2.5000 mg | INHALATION_SOLUTION | RESPIRATORY_TRACT | Status: DC | PRN

## 2022-09-01 MED ORDER — BANATROL TF EN LIQD
60.0000 mL | Freq: Two times a day (BID) | ENTERAL | Status: DC
  Administered 2022-09-01 – 2022-09-03 (×6): 60 mL
  Filled 2022-09-01 (×6): qty 60

## 2022-09-01 NOTE — Progress Notes (Addendum)
NAME:  Jimmy Marshall, MRN:  [id number], DOB:  June 08, 1955, LOS: 6 ADMISSION DATE:  08/26/2022 CONSULTATION DATE:  08/26/2022 REFERRING MD:  Kommor - EDP CHIEF COMPLAINT:  AMS, ICH   History of Present Illness:  66 year old man who presented to [facility] ED 5/22 after being found down at a hotel.  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  Patient is intubated and sedated, therefore history is obtained from chart review.  Per EMS, patient is currently residing in a hotel and was found down by staff who went to collect rent from patient.  Found unresponsive, RR 4 and Narcan 1mg  (IN) and 3mg  IV given with no response.  Intubated on arrival to ED. CXR notable for left greater than right atelectasis.  Per EDP, BP very labile; hypertensive on admission then dropping BP to SBP 70s requiring Neo pushes.  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  Neurosurgery was consulted.  CTA Chest/A/P negative for aortic dissection, overall unchanged enlargement of distal aortic arch/ascending thoracic aorta.  PCCM consulted for ICU admission.  Pertinent Medical History:   Past Medical History:  Diagnosis Date   GERD (gastroesophageal reflux disease)    Glaucoma    Hypertension    Schizoaffective disorder, depressive type (HCC)    "dx'd in the 1990's"   Schizophrenia, schizo-affective (HCC)   Cocaine abuse   Significant Hospital Events: Including procedures, antibiotic start and stop dates in addition to other pertinent events   5/22 -presented via EMS after being found down at hotel.  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 Neuro/Stroke consulted. Neurosurgery consulted. EVD placement. 5/24 3%NS held for elevated Na and Cl, CT imaging shows non-worsening  hematoma  5/27 mucous plugging, bradycardic> FOB, intermittent myoclonic jerking noted  Interim History / Subjective:  Mucous plugging w/ bradycardia 5/27 requiring bronch and ETT exchanged for occluded ETT Concern for myoclonic jerking, EEG overnight c/w diffuse encephalopathy, no seizures/ epileptiform discharges noted, jerking repeated> placing cEEG  Tmax 102.4, WBC 8.7 RUE +for DVT  Objective:  Blood pressure (!) 154/82, pulse 96, temperature (!) 100.4 F (38 C), resp. rate 20, height 5\' 11"  (1.803 m), weight 93.9 kg, SpO2 96 %.    Vent Mode: PRVC FiO2 (%):  [40 %-60 %] 40 % Set Rate:  [18 bmp] 18 bmp Vt Set:  [600 mL] 600 mL PEEP:  [5 cmH20-8 cmH20] 8 cmH20 Plateau Pressure:  [14 cmH20-20 cmH20] 20 cmH20   Intake/Output Summary (Last 24 hours) at 09/01/2022 0758 Last data filed at 09/01/2022 0700 Gross per 24 hour  Intake 2928.76 ml  Output 1998 ml  Net 930.76 ml   Filed Weights   08/27/22 0500 08/28/22 0500 08/29/22 0426  Weight: 88.7 kg 92.8 kg 93.9 kg   Physical Examination: Fent 75, propofol 50 General:  critically ill adult male in NAD, +hiccups HEENT: MM pink/moist, ETT/ OGT, pupils 2/non reactive w/ scleral edema, +cornel's, R EVD with bulky dressing at 10 cmH2O with serosang drainage Neuro: unresponsive, no response to noxious stimuli CV: rr, NSR, PICC LUE PULM:  non labored, full MV support, clear, diminished R base, mod tan secretions GI: soft, bs+, ND, NT, purwick, rectal pouch Extremities: warm/dry, no LE edema, R AC area marked with some mild erythema/ warmth, few blisters Skin: no rashes   Labs/imaging reviewed  UOP 1.6L/ 24hrs Net +4.7L EVD  173> 181> 178 (24hr total)  Assessment & Plan:  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  Placing cEEG to rule out seizure/ myoclonus vs movements 2/2 hiccups - once seizure activity rule out on cEEG> would like to do WUA to assess underlying neuro exam, remains on fentanyl/ propofol - Poor prognosis for functional recovery per NSGY.  Await neurology input for neuro prognosis  - ongoing GOC, consider PMT consult,  - serial neuro checks - adding baclofen for hiccups - Neuroprotective measures: HOB > 30 degrees, normoglycemia, normothermia, electrolytes WNL - PT/OT when able to participate in care  HTN emergency - cont cleviprex for strict SBP goals 120-160 - cont losartan 100mg  daily, norvasc 10, spiro 25mg  daily.  Add coreg.  Prior outpt notes w/plan to stop norvasc and put on diltz given prior cocaine abuse, appears to be clean since fall 2023.  Acute hypoxemic respiratory failure Aspiration pneumonia Mucous plugging  - Continue full vent support, daily SAT/SBT as able  - Wean FiO2 for O2 sat > 90% - VAP bundle/ PPI - pending tracheal aspirate, unasyn tentative 5d course - add guaifenesin, CPT prn.  Completed 3 days of HTS nebs - PAD protocol > propofol/ fentanyl, prn versed, if no seizure activity, do WUA/ SBT on cEEG  RUE DVT - PIVs removed, continue to elevate, supportive care  - lovenox ppx as below, no further [facility] with ICH  Thoracic aortic intramural hematoma and descending thoracic aortic aneurysm HTN Noted on scan 08/2021 4.6 cm. - SBP goal per above - Resume home medications as appropriate  Schizoaffective disorder/schizophrenia - holding zyprexa   GERD - PPI  At risk for hyperglycemia - SSI  Best Practice: (right click and "Reselect all SmartList Selections" daily)   Diet/type: tubefeeds DVT prophylaxis: LMWH GI prophylaxis: PPI Lines: N/A Foley:  N/A Code Status:  full code Last date of multidisciplinary goals of care discussion  - pending 5/28.    Labs:  CBC: Recent Labs  Lab 08/26/22 1420  08/26/22 1614 08/28/22 0155 08/29/22 0439 08/30/22 0541 08/31/22 0404 09/01/22 0433  WBC 11.7*   < > 9.5 6.0 7.6 7.7 8.7  NEUTROABS 10.8*  --   --   --   --   --   --   HGB 12.7*   < > 10.2* 10.5* 11.3* 11.1* 10.6*  HCT 41.8   < > 32.5* 34.9* 36.0* 33.9* 33.8*  MCV 89.9   < > 87.1 91.8 87.2 86.5 86.9  PLT 195   < > 178 171 191 180 178   < > = values in this interval not displayed.   Basic Metabolic Panel: Recent Labs  Lab 08/27/22 0208 08/27/22 0234 08/27/22 1441 08/27/22 2021 08/28/22 0155 08/28/22 0808 08/28/22 1644 08/28/22 1935 08/29/22 0439 08/29/22 0815 08/30/22 0541 08/30/22 0813 08/31/22 0404 08/31/22 0730 08/31/22 1401 08/31/22 2026 09/01/22 0433  NA 148*   < >  --    < > 156*   < >  --    < > 157*   < > 156*   < > 153* 151* 152* 151* 150*  K 3.5   < >  --   --  3.6  --   --   --  3.8  --  3.1*  --  3.3*  --   --   --  3.6  CL 119*  --   --   --  130*  --   --   --  129*  --  123*  --  121*  --   --   --  119*  CO2 20*  --   --   --  20*  --   --   --  21*  --  22  --  23  --   --   --  23  GLUCOSE 111*  --   --   --  104*  --   --   --  153*  --  126*  --  147*  --   --   --  177*  BUN 14  --   --   --  16  --   --   --  26*  --  25*  --  35*  --   --   --  29*  CREATININE 1.08  --   --   --  0.96  --   --   --  1.09  --  1.11  --  1.27*  --   --   --  1.06  CALCIUM 8.6*  --   --   --  8.0*  --   --   --  8.8*  --  9.1  --  8.7*  --   --   --  8.0*  MG 2.1  --  2.2  --  2.3  --  2.7*  --   --   --   --   --   --   --   --   --   --   PHOS 1.4*  --  2.5  --  2.1*  --  2.6  --   --   --  3.4  --   --   --   --   --   --    < > = values in this interval not displayed.   GFR: Estimated Creatinine Clearance: 82.4 mL/min (by C-G formula based on SCr of 1.06 mg/dL). Recent Labs  Lab 08/26/22 1311 08/26/22 1420 08/26/22 1421 08/26/22 1510 08/27/22 0208 08/29/22 0439 08/30/22 0541 08/31/22 0404 09/01/22 0433  PROCALCITON  --   --   --  0.18  --   --   --    --   --   WBC  --    < >  --   --    < > 6.0 7.6 7.7 8.7  LATICACIDVEN 4.3*  --  4.4*  --   --   --   --   --   --    < > = values in this interval not displayed.    Liver Function Tests: Recent Labs  Lab 08/26/22 1420  AST 24  ALT 18  ALKPHOS 52  BILITOT 0.8  PROT 7.0  ALBUMIN 3.9   No results for input(s): "LIPASE", "AMYLASE" in the last 168 hours. No results for input(s): "AMMONIA" in the last 168 hours.  ABG:    Component Value Date/Time   PHART 7.459 (H) 08/27/2022 0234   PCO2ART 25.1 (L) 08/27/2022 0234   PO2ART 124 (H) 08/27/2022 0234   HCO3 17.7 (L) 08/27/2022 0234   TCO2 18 (L) 08/27/2022 0234   ACIDBASEDEF 4.0 (H) 08/27/2022 0234   O2SAT 99 08/27/2022 0234    Coagulation Profile:  No results for input(s): "INR", "PROTIME" in the last 168 hours.  Cardiac Enzymes: No results for input(s): "CKTOTAL", "CKMB", "CKMBINDEX", "TROPONINI" in the last 168 hours.  HbA1C: Hgb A1c MFr Bld  Date/Time Value Ref Range Status  08/27/2022 02:08 AM 6.0 (H) 4.8 - 5.6 % Final    Comment:    (NOTE)         Prediabetes: 5.7 - 6.4         Diabetes: >6.4         Glycemic control for adults with diabetes: <7.0   08/26/2022 04:06 PM 5.6 4.8 - 5.6 % Final    Comment:    (NOTE) Pre diabetes:          5.7%-6.4%  Diabetes:              >6.4%  Glycemic control for   <7.0% adults with diabetes    CBG: Recent Labs  Lab 08/31/22 1203 08/31/22 1607 08/31/22 1918 08/31/22 2319 09/01/22 0323  GLUCAP 112* 91 116* 119* 102*    Critical care time:    The patient is critically ill with multiple organ system failure and requires high complexity decision making for assessment and support, frequent evaluation and titration of therapies, advanced monitoring, review of radiographic studies and interpretation of complex data.   Critical Care Time devoted to patient care services, exclusive of separately billable procedures, described in this note is 35 minutes.   Posey Boyer,  MSN, AG-ACNP-BC  Pulmonary & Critical Care 09/01/2022, 7:58 AM  See Amion for pager If no response to pager, please call PCCM consult pager After 7:00 pm call Elink      From 7A-7P if no response, please call [phone] After hours, please call ELink [phone]

## 2022-09-01 NOTE — Progress Notes (Signed)
LTM EEG running - no initial skin breakdown - push button tested. 

## 2022-09-01 NOTE — Progress Notes (Signed)
RN witnessed patient jerking in the chest area for 5 min. CCM ordered 2mg  of Versed. Neuro ordered continuous EEG.

## 2022-09-01 NOTE — Progress Notes (Addendum)
SLP Cancellation Note  Patient Details Name: Jimmy Marshall MRN: [id number] DOB: 06-17-1955   Cancelled treatment:       Reason Eval/Treat Not Completed: Medical issues which prohibited therapy, intubated will sign off and await new orders   Kima Malenfant, Riley Nearing 09/01/2022, 8:46 AM

## 2022-09-01 NOTE — Procedures (Signed)
Patient Name: Jimmy Marshall  MRN: [id number]  Epilepsy Attending: Charlsie Quest  Referring Physician/Provider: Milon Dikes, MD  Date: 09/01/2022 Duration: 23.05 mins  Patient history: 67yo M with left thalamic ICH now with seizure like activity. EEG to evaluate for seizure.  Level of alertness: comatose  AEDs during EEG study: LEV, Propofol  Technical aspects: This EEG study was done with scalp electrodes positioned according to the 10-20 International system of electrode placement. Electrical activity was reviewed with band pass filter of 1-70Hz , sensitivity of 7 uV/mm, display speed of 52mm/sec with a 60Hz  notched filter applied as appropriate. EEG data were recorded continuously and digitally stored.  Video monitoring was available and reviewed as appropriate.  Description: EEG showed continuous generalized 3 to 6 Hz theta-delta slowing admixed with brief 1-3 seconds of generalized attenuation. Hyperventilation and photic stimulation were not performed.     ABNORMALITY - Continuous slow, generalized  IMPRESSION: This study is suggestive of severe diffuse encephalopathy, nonspecific etiology but could be related to sedation. No seizures or epileptiform discharges were seen throughout the recording.  [name] Annabelle Harman

## 2022-09-01 NOTE — Progress Notes (Addendum)
eLink Physician-Brief Progress Note Patient Name: Jimmy Marshall DOB: 08-21-1955 MRN: [id number]   Date of Service  09/01/2022  HPI/Events of Note  Pt completed EEG and the patient started with jerking movements again.   No seizure activity seen on spot EEG.   eICU Interventions  Trial of versed 2mg  IV.  RN to notify Neurology again.      Intervention Category Intermediate Interventions: Other:  [name] 09/01/2022, 5:11 AM

## 2022-09-01 NOTE — Progress Notes (Signed)
STROKE TEAM PROGRESS NOTE   INTERVAL HISTORY Sister is at the bedside and daughter over the phone. Daughter will come this pm and will need GOC discussion. Pt still unresponsive. Intubated on EVD, no significant neuro change or improvement. Currently hooking up for LTM EEG for overnight whole body jerking.     Vitals:   09/01/22 0900 09/01/22 1000 09/01/22 1100 09/01/22 1118  BP: (!) 158/74 (!) 184/80 (!) 142/74   Pulse: 88 90 85 86  Resp: (!) 27 (!) 24 18 18   Temp: (!) 100.6 F (38.1 C) 100 F (37.8 C) (!) 100.8 F (38.2 C) (!) 100.4 F (38 C)  TempSrc:      SpO2: 97% 97% 95% 94%  Weight:      Height:       CBC:  Recent Labs  Lab 08/26/22 1420 08/26/22 1614 08/31/22 0404 09/01/22 0433  WBC 11.7*   < > 7.7 8.7  NEUTROABS 10.8*  --   --   --   HGB 12.7*   < > 11.1* 10.6*  HCT 41.8   < > 33.9* 33.8*  MCV 89.9   < > 86.5 86.9  PLT 195   < > 180 178   < > = values in this interval not displayed.   Basic Metabolic Panel:  Recent Labs  Lab 08/28/22 0155 08/28/22 0808 08/28/22 1644 08/28/22 1935 08/30/22 0541 08/30/22 0813 08/31/22 0404 08/31/22 0730 08/31/22 2026 09/01/22 0433  NA 156*   < >  --    < > 156*   < > 153*   < > 151* 150*  K 3.6  --   --    < > 3.1*  --  3.3*  --   --  3.6  CL 130*  --   --    < > 123*  --  121*  --   --  119*  CO2 20*  --   --    < > 22  --  23  --   --  23  GLUCOSE 104*  --   --    < > 126*  --  147*  --   --  177*  BUN 16  --   --    < > 25*  --  35*  --   --  29*  CREATININE 0.96  --   --    < > 1.11  --  1.27*  --   --  1.06  CALCIUM 8.0*  --   --    < > 9.1  --  8.7*  --   --  8.0*  MG 2.3  --  2.7*  --   --   --   --   --   --   --   PHOS 2.1*  --  2.6  --  3.4  --   --   --   --   --    < > = values in this interval not displayed.   Lipid Panel:  Recent Labs  Lab 08/27/22 0208 08/30/22 0541  CHOL 155  --   TRIG 105  114 160*  HDL 33*  --   CHOLHDL 4.7  --   VLDL 21  --   LDLCALC 101*  --    HgbA1c:  Recent Labs   Lab 08/27/22 0208  HGBA1C 6.0*   Urine Drug Screen:  Recent Labs  Lab 08/29/22 1018  LABOPIA NONE DETECTED  COCAINSCRNUR NONE DETECTED  LABBENZ NONE  DETECTED  AMPHETMU NONE DETECTED  THCU POSITIVE*  LABBARB NONE DETECTED    Alcohol Level No results for input(s): "ETH" in the last 168 hours.  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 EEG to evaluate for seizure. Level of alertness: comatose AEDs during EEG study: LEV, Propofol Technical aspects: This EEG study was done with scalp electrodes positioned according to the 10-20 International system of electrode placement. Electrical activity was reviewed with band pass filter of 1-70Hz , sensitivity of 7 uV/mm, display speed of 66mm/sec with a 60Hz  notched filter applied as appropriate. EEG data were recorded continuously and digitally stored.  Video monitoring was available and reviewed as appropriate. Description: EEG showed continuous generalized 3 to 6 Hz theta-delta slowing admixed with brief 1-3 seconds of generalized attenuation. Hyperventilation and photic stimulation were not performed.   ABNORMALITY - Continuous slow, generalized IMPRESSION: This study is suggestive of severe diffuse encephalopathy, nonspecific etiology but could be related to sedation. No seizures or epileptiform discharges were seen throughout the recording. Charlsie Quest   DG Abd 1 View  Result Date: 08/31/2022 CLINICAL DATA:  Encounter to confirm orogastric tube placement. EXAM: ABDOMEN - 1 VIEW COMPARISON:  08/28/2022 FINDINGS: Tip and side port of the enteric tube below the diaphragm in the stomach. Nonobstructive upper abdominal bowel gas pattern. IMPRESSION:  Tip and side port of the enteric tube below the diaphragm in the stomach. Electronically Signed   By: Narda Rutherford M.D.   On: 08/31/2022 18:08   DG CHEST PORT 1 VIEW  Result Date: 08/31/2022 CLINICAL DATA:  Endotracheal tube present. EXAM: PORTABLE CHEST 1 VIEW COMPARISON:  Radiograph earlier today FINDINGS: Tip of the endotracheal tube 3.5 cm from the carina. Enteric tube below the diaphragm. Left central line remains in position. Mildly lower lung volumes. Unchanged bibasilar opacities, left greater than right, with air bronchograms on the left. There may be a left pleural effusion. Stable heart size and mediastinal contours. No pneumothorax. IMPRESSION: 1. Endotracheal tube tip 3.5 cm from the carina. Enteric tube below the diaphragm. 2. Unchanged bibasilar opacities, left greater than right, with air bronchograms on the left, atelectasis/collapse versus pneumonia. Possible left pleural effusion. Electronically Signed   By: Narda Rutherford M.D.   On: 08/31/2022 18:08   VAS [place] UPPER EXTREMITY VENOUS DUPLEX  Result Date: 08/31/2022 UPPER VENOUS STUDY  Patient Name:  Jimmy Marshall  Date of Exam:   08/31/2022 Medical Rec #: [id number]        Accession #:    [id number] Date of Birth: September 05, 1955       Patient Gender: M Patient Age:   67 years Exam Location:  [facility] Procedure:      VAS [place] UPPER EXTREMITY VENOUS DUPLEX Referring Phys: Felisa Bonier --------------------------------------------------------------------------------  Indications: Right arm swelling, erythema Comparison Study: No prior studies. Performing Technologist: Jean Rosenthal RDMS, RVT  Examination Guidelines: A complete evaluation includes B-mode imaging, spectral Doppler, color Doppler, and power Doppler as needed of all accessible portions of each vessel. Bilateral testing is considered an integral part of a complete examination. Limited examinations for reoccurring indications may be performed as noted.  Right Findings:  +----------+------------+---------+-----------+----------+-------+ RIGHT     CompressiblePhasicitySpontaneousPropertiesSummary +----------+------------+---------+-----------+----------+-------+ IJV  Full       Yes       Yes                      +----------+------------+---------+-----------+----------+-------+ Subclavian               Yes       Yes                      +----------+------------+---------+-----------+----------+-------+ Axillary      Full                                          +----------+------------+---------+-----------+----------+-------+ Brachial      None       No        No                Acute  +----------+------------+---------+-----------+----------+-------+ Radial        Full                                          +----------+------------+---------+-----------+----------+-------+ Ulnar         Full                                          +----------+------------+---------+-----------+----------+-------+ Cephalic      Full                                          +----------+------------+---------+-----------+----------+-------+ Basilic       Full                                          +----------+------------+---------+-----------+----------+-------+  Left Findings: +----------+------------+---------+-----------+----------+-------+ LEFT      CompressiblePhasicitySpontaneousPropertiesSummary +----------+------------+---------+-----------+----------+-------+ Subclavian               Yes       Yes                      +----------+------------+---------+-----------+----------+-------+  Summary:  Right: Findings consistent with acute deep vein thrombosis involving the right brachial veins.  Left: No evidence of thrombosis in the subclavian.  *See table(s) above for measurements and observations.  Diagnosing physician: Waverly Ferrari MD Electronically signed by Waverly Ferrari MD on 08/31/2022 at  6:00:07 PM.    Final    DG CHEST PORT 1 VIEW  Result Date: 08/31/2022 CLINICAL DATA:  Mucous plugging of bronchi. EXAM: PORTABLE CHEST 1 VIEW COMPARISON:  AP chest 08/30/2022 and 08/29/2022 FINDINGS: Endotracheal tube tip terminates approximately 6.5 cm above the carina, at the level of the inferior aspect of the clavicular heads. This appears to be mildly retracted from prior. Left upper extremity PICC tip terminates overlying the superior vena cava/right atrial junction, new from prior. Enteric tube descends below the diaphragm with the tip excluded by collimation, similar to prior. Cardiac silhouette and mediastinal contours are unchanged and within normal limits. Mildly to mildly decreased lung volumes. Left basilar horizontal linear opacities are  similar to multiple prior radiographs. Additional heterogeneous retrocardiac opacity. No pneumothorax. No acute skeletal abnormality. IMPRESSION: 1. Endotracheal tube tip terminates approximately 6.5 cm above the carina, at the level of the inferior aspect of the clavicular heads. This appears to be mildly retracted from prior. 2. Left upper extremity PICC tip terminates overlying the superior vena cava/right atrial junction, new from prior. 3. Left basilar streaky heterogeneous opacity, atelectasis versus pneumonia, similar to prior. Electronically Signed   By: Neita Garnet M.D.   On: 08/31/2022 16:44    PHYSICAL EXAM  Temp:  [99.9 F (37.7 C)-102.4 F (39.1 C)] 100.4 F (38 C) (05/28 1118) Pulse Rate:  [74-114] 86 (05/28 1118) Resp:  [9-29] 18 (05/28 1118) BP: (116-228)/(66-107) 142/74 (05/28 1100) SpO2:  [91 %-100 %] 94 % (05/28 1118) FiO2 (%):  [40 %-60 %] 40 % (05/28 1118)  General - Well nourished, well developed, intubated on sedation.  Ophthalmologic - fundi not visualized due to noncooperation.  Cardiovascular - Regular rhythm and rate  Neuro - intubated on propofol and fentanyl, eyes closed, not following commands. With forced eye  opening, left eye in mid position, right eye inward gaze, not blinking to visual threat, doll's eyes sluggish, not tracking, b/l pupil 2mm, sluggish to light. Corneal reflex weakly present bilaterally, gag and cough present. Breathing over the vent.  Facial symmetry not able to test due to ET tube.  Tongue protrusion not cooperative. On pain stimulation, left UE mild withdraw and LLE slight withdraw however, both LUE and LLE have spontaneous movement. RUE and RLE flaccid. Sensation, coordination and gait not tested.    ASSESSMENT/PLAN Mr. Dakarai Jeppsen is a 67 y.o. 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 S/p EVD, Ventricular system may be slightly smaller but otherwise no significant improvement. Mild new rightward midline shift, 2-3 mm CTA head & neck no AVM or aneurysm CT repeat showed stable left thalamic ICH with large IVH.  Hydrocephalus has improved after EVD placement. MRI pending 2D Echo LVEF 50-55% EEG no seizure LDL 101 HgbA1c 5.6  UDS - positive for THC EEG suggestive of severe diffuse encephalopathy of nonspecific etiology though likely related to sedation.  No seizures or epileptiform discharges seen throughout recording. VTE prophylaxis - heparin subq No antithrombotic prior to  admission, now on No antithrombotic due to IPH On keppra for 7 days for seizure prevention Therapy recommendations:  pending Disposition:  pending GOC discussion with daughter this pm  Seizure like activity 5/28 recurrent episode of whole body jerking Stat EEG no seizure Currently on LTM EEG  Hydrocephalus s/p EVD placement Cerebral edema On HTS 3% s/p 250 mL bolus, 75 cc/h gtt -> off Na 148 > 152->156->153 ->152->150 Off free water CT repeat showed hydrocephalus has improved  Respiratory failure Fever ?  Aspiration pneumonia CCM on board for vent management Tmax 102.1->100.9 WBC 7.7->8.7 On Unasyn  Hypertensive emergency Home meds:  losartan, diltiazem  Stable now on cleviprex gtt On losartan 100, amlodipine 10, and spironolactone 25 PRN labetalol and hydralazine for hypertension  Blood pressure goal systolic < 160 Long-term BP goal normotensive  Hyperlipidemia Home meds: None LDL 101, goal < 70 Consider adding statin at discharge  Other Stroke Risk Factors Substance abuse - UDS:  THC POSITIVE. Patient will be advised to stop using due to stroke risk.  Other Active Problems Schizoaffective disorder, depressive type On Zyprexa Glaucoma Aortic aneurysm   Hospital day # 6  This patient is critically ill due to ICH, IVH and  hydrocephalus, respiratory failure, hypertensive emergency and at significant risk of neurological worsening, death form hematoma expansion, brain herniation, obstructive hydrocephalus, status epilepticus. This patient's care requires constant monitoring of vital signs, hemodynamics, respiratory and cardiac monitoring, review of multiple databases, neurological assessment, discussion with family, other specialists and medical decision making of high complexity. I spent 35 minutes of neurocritical care time in the care of this patient. I discussed with sister and she asked me to talk to pt POA daughter which will take place this afternoon  Marvel Plan, MD PhD Stroke Neurology 09/01/2022 12:23 PM   To contact Stroke Continuity provider, please refer to [email or web address]. After hours, contact General Neurology

## 2022-09-01 NOTE — Progress Notes (Signed)
Patient ID: Jimmy Marshall, male   DOB: 12/20/1955, 67 y.o.   MRN: [id number] EVD is patent and draining 5 to 11 cc/h.  CT scan of the head yesterday shows resolution of hydrocephalus but significant clot burden within the ventricles and in the left thalamus which is really unchanged over almost 1 week now.  Clinically he does not look good.  He has constant hiccups over the last 3 to 4 days.  He is no longer following commands.  Sometimes he can be purposeful on the left.  He is right hemiplegic.  His pupils are 2 mm and unreactive.  He does have good corneal reflex.  I think the prognosis for functional recovery here is quite poor.  Clot burden remains high after almost a week and will take quite a long time to resolve.  I think there needs to be a very frank discussion with his family regarding prognosis and goals of care.

## 2022-09-01 NOTE — Progress Notes (Signed)
[facility] ADULT ICU REPLACEMENT PROTOCOL   The patient does apply for the [facility] Adult ICU Electrolyte Replacment Protocol based on the criteria listed below:   1.Exclusion criteria: TCTS, ECMO, Dialysis, and Myasthenia Gravis patients 2. Is GFR >/= 30 ml/min? Yes.    Patient's GFR today is >60 3. Is SCr </= 2? Yes.   Patient's SCr is 1.06 mg/dL 4. Did SCr increase >/= 0.5 in 24 hours? No. 5.Pt's weight >40kg  Yes.   6. Abnormal electrolyte(s): K+ 3.6  7. Electrolytes replaced per protocol 8.  Call MD STAT for K+ </= 2.5, Phos </= 1, or Mag </= 1 Physician:  Ardeth Perfect [facility] 09/01/2022 5:58 AM

## 2022-09-01 NOTE — Plan of Care (Signed)
Called by RN - of whole body jerking again. Called CCM who ordered Versed. Resolved now. Spot EEG negative for focality or sz. Since this is happening intermittently - will do LTM EEG for spell capture.  -- Milon Dikes, MD Neurologist [facility] Pager: [phone]

## 2022-09-01 NOTE — Progress Notes (Signed)
RN witnessed patient jerking in his chest area. Blood pressure spiked to 200s, tachy, and temperature started to rise. No neruo changes otherwise. EVD draining appropriately. Called CCM and Neuro. Spot eeg ordered.

## 2022-09-01 NOTE — Progress Notes (Signed)
EEG complete - results pending 

## 2022-09-02 ENCOUNTER — Inpatient Hospital Stay (HOSPITAL_COMMUNITY): Payer: No Typology Code available for payment source

## 2022-09-02 DIAGNOSIS — I610 Nontraumatic intracerebral hemorrhage in hemisphere, subcortical: Secondary | ICD-10-CM | POA: Diagnosis not present

## 2022-09-02 DIAGNOSIS — R569 Unspecified convulsions: Secondary | ICD-10-CM | POA: Diagnosis not present

## 2022-09-02 LAB — BASIC METABOLIC PANEL
Anion gap: 11 (ref 5–15)
BUN: 27 mg/dL — ABNORMAL HIGH (ref 8–23)
CO2: 19 mmol/L — ABNORMAL LOW (ref 22–32)
Calcium: 8.2 mg/dL — ABNORMAL LOW (ref 8.9–10.3)
Chloride: 113 mmol/L — ABNORMAL HIGH (ref 98–111)
Creatinine, Ser: 1.03 mg/dL (ref 0.61–1.24)
GFR, Estimated: >60 mL/min (ref >60)
Glucose, Bld: 179 mg/dL — ABNORMAL HIGH (ref 70–99)
Potassium: 4.1 mmol/L (ref 3.5–5.1)
Sodium: 143 mmol/L (ref 135–145)

## 2022-09-02 LAB — GLUCOSE, CAPILLARY
Glucose-Capillary: 152 mg/dL — ABNORMAL HIGH (ref 70–99)
Glucose-Capillary: 176 mg/dL — ABNORMAL HIGH (ref 70–99)
Glucose-Capillary: 135 mg/dL — ABNORMAL HIGH (ref 70–99)
Glucose-Capillary: 201 mg/dL — ABNORMAL HIGH (ref 70–99)
Glucose-Capillary: 133 mg/dL — ABNORMAL HIGH (ref 70–99)
Glucose-Capillary: 151 mg/dL — ABNORMAL HIGH (ref 70–99)

## 2022-09-02 LAB — CULTURE, RESPIRATORY W GRAM STAIN

## 2022-09-02 LAB — CBC
HCT: 32.6 % — ABNORMAL LOW (ref 39.0–52.0)
Hemoglobin: 10.6 g/dL — ABNORMAL LOW (ref 13.0–17.0)
MCH: 28.8 pg (ref 26.0–34.0)
MCHC: 32.5 g/dL (ref 30.0–36.0)
MCV: 88.6 fL (ref 80.0–100.0)
Platelets: 186 10*3/uL (ref 150–400)
RBC: 3.68 MIL/uL — ABNORMAL LOW (ref 4.22–5.81)
RDW: 18.7 % — ABNORMAL HIGH (ref 11.5–15.5)
WBC: 7.6 10*3/uL (ref 4.0–10.5)
nRBC: 0 % (ref 0.0–0.2)

## 2022-09-02 LAB — TRIGLYCERIDES
Triglycerides: 1118 mg/dL — ABNORMAL HIGH (ref <150)
Triglycerides: 193 mg/dL — ABNORMAL HIGH (ref <150)

## 2022-09-02 LAB — MAGNESIUM: Magnesium: 3 mg/dL — ABNORMAL HIGH (ref 1.7–2.4)

## 2022-09-02 MED ORDER — CARVEDILOL 12.5 MG PO TABS
25.0000 mg | ORAL_TABLET | Freq: Two times a day (BID) | ORAL | Status: DC
  Administered 2022-09-02 – 2022-09-05 (×7): 25 mg
  Filled 2022-09-02 (×7): qty 2

## 2022-09-02 NOTE — Procedures (Signed)
Patient Name: Jimmy Marshall  MRN: [id number]  Epilepsy Attending: Charlsie Quest  Referring Physician/Provider: Milon Dikes, MD  Duration: 09/02/2022 0849 to 09/02/2022 1257   Patient history: 67yo M with left thalamic ICH now with seizure like activity. EEG to evaluate for seizure.   Level of alertness: lethargic/sedated   AEDs during EEG study: Propofol   Technical aspects: This EEG study was done with scalp electrodes positioned according to the 10-20 International system of electrode placement. Electrical activity was reviewed with band pass filter of 1-70Hz , sensitivity of 7 uV/mm, display speed of 52mm/sec with a 60Hz  notched filter applied as appropriate. EEG data were recorded continuously and digitally stored.  Video monitoring was available and reviewed as appropriate.   Description: No posterior dominant rhythm was seen. Sleep was characterized by sleep spindles 912-14hz ) maximal frontocentral region. EEG showed continuous generalized predominantly 5 -7Hz  theta slowing admixed with intermittent 2-3hz  delta slowing.  Hyperventilation and photic stimulation were not performed.       ABNORMALITY - Continuous slow, generalized   IMPRESSION: This study is suggestive of severe diffuse encephalopathy, nonspecific etiology but could be related to sedation. No seizures or epileptiform discharges were seen throughout the recording.    Dorothe Elmore Annabelle Harman

## 2022-09-02 NOTE — Progress Notes (Signed)
NAME:  Jimmy Marshall, MRN:  [id number], DOB:  1955-11-09, LOS: 7 ADMISSION DATE:  08/26/2022 CONSULTATION DATE:  08/26/2022 REFERRING MD:  Kommor - EDP CHIEF COMPLAINT:  AMS, ICH   History of Present Illness:  67 year old man who presented to [facility] ED 5/22 after being found down at a hotel.  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  Patient is intubated and sedated, therefore history is obtained from chart review.  Per EMS, patient is currently residing in a hotel and was found down by staff who went to collect rent from patient.  Found unresponsive, RR 4 and Narcan 1mg  (IN) and 3mg  IV given with no response.  Intubated on arrival to ED. CXR notable for left greater than right atelectasis.  Per EDP, BP very labile; hypertensive on admission then dropping BP to SBP 70s requiring Neo pushes.  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  Neurosurgery was consulted.  CTA Chest/A/P negative for aortic dissection, overall unchanged enlargement of distal aortic arch/ascending thoracic aorta.  PCCM consulted for ICU admission.  Pertinent Medical History:   Past Medical History:  Diagnosis Date   GERD (gastroesophageal reflux disease)    Glaucoma    Hypertension    Schizoaffective disorder, depressive type (HCC)    "dx'd in the 1990's"   Schizophrenia, schizo-affective (HCC)   Cocaine abuse   Significant Hospital Events: Including procedures, antibiotic start and stop dates in addition to other pertinent events   5/22 -presented via EMS after being found down at hotel.  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 Neuro/Stroke consulted. Neurosurgery consulted. EVD placement. 5/24 3%NS held for elevated Na and Cl, CT imaging shows non-worsening  hematoma  5/27 mucous plugging, bradycardic> FOB, intermittent myoclonic jerking noted 5/28 on cEEG for concern of seizures, tmax 102.4/ WBC 8.7, +RUE DVT  Interim History / Subjective:  Ongoing cEEG, no reports of hiccups overnight, still has occasional shoulder/ head jerking (non-epileptic on cEEG) Tmax 101.7 overnight, WBC stable 7.6  Objective:  Blood pressure 124/69, pulse 92, temperature 100 F (37.8 C), resp. rate 18, height 5\' 11"  (1.803 m), weight 96.7 kg, SpO2 96 %.    Vent Mode: PRVC FiO2 (%):  [40 %] 40 % Set Rate:  [18 bmp] 18 bmp Vt Set:  [600 mL] 600 mL PEEP:  [8 cmH20] 8 cmH20 Plateau Pressure:  [15 cmH20-21 cmH20] 15 cmH20   Intake/Output Summary (Last 24 hours) at 09/02/2022 0817 Last data filed at 09/02/2022 0800 Gross per 24 hour  Intake 2258.72 ml  Output 2168 ml  Net 90.72 ml   Filed Weights   08/28/22 0500 08/29/22 0426 09/02/22 0500  Weight: 92.8 kg 93.9 kg 96.7 kg   Physical Examination: Fent 75, propofol 50> paused General:  critically ill older male in NAD HEENT: MM pink/moist, poor dentition, ETT/ OGT, R frontal EVD at 10 with bloody output, pupils 2/ non reactive, intact corneals, EEG leads in place Neuro: unresponsive, no gag CV: rr, NSR, no murmur, RUE PICC PULM:  MV supported, clear, +cough GI: soft, bs+, ND, purwick, +bowel movement around pouch Extremities: warm/dry, BUE edema, R> L, RUE with intact unchanged few blisters to R AC area Skin: no rashes    Labs/imaging reviewed  UOP 1.7L/ 24hrs Net +4.8L EVD 173> 181> 178 > 194 (24hr total)  Trach asp> kleb pna, GNR,  diptheroids> pending susceptibilities   Assessment & Plan:  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  Stopping sedation while on cEEG> continue to monitor  - Keppra/ AED's per Neuro.  - continue baclofen for hiccups, has helped - cont Neuroprotective measures: HOB > 30 degrees, normoglycemia, normothermia, electrolytes WNL - Poor prognosis for functional recovery per NSGY and neurology.  Ongoing GOC with pt's children.  Daughter did relay to Neuro 5/28, no trach/ peg  HTN emergency - cont to wean cleviprex for strict SBP goals 120-160 - triglyceride level 1118, suspect erroneous but is on cleviprex and propofol, redrawn to verify> 193 - cont losartan 100mg  daily, norvasc 10, spiro 25mg , and increase coreg today to wean off cleviprex.     Acute hypoxemic respiratory failure Aspiration pneumonia Klebsiella PNA Mucous plugging  - Continue full vent support - Wean FiO2 for O2 sat > 90% - VAP bundle/ PPI - continue unasyn for 5-7 day course.   - cont guaifenesin, CPT as tolerated. Secretions are small in amount.  - PAD protocol > propofol/ fentanyl, stopped around 0815 today, no improvement in neurological status thus far.  Continue to monitor off sedation   RUE DVT - continue to elevate, supportive care  - lovenox ppx as below, no further [facility] with ICH  Thoracic aortic intramural hematoma and descending thoracic aortic aneurysm HTN Noted on scan 08/2021 4.6 cm. - BP management as above   Schizoaffective disorder/schizophrenia - holding zyprexa   GERD - PPI  At risk for hyperglycemia - controlled, cont SSI  Fever - WBC remains reassuring/ normal.  Likely 2/2 DVT and ICH - being adequately covered as above for HCAP  Best Practice: (right click and "Reselect all SmartList Selections" daily)   Diet/type: tubefeeds DVT prophylaxis: LMWH GI prophylaxis: PPI Lines: Central line-picc  Foley:  N/A Code Status:  full code Last date of multidisciplinary goals of care discussion  - GOC ongoing with pt's children.  Pts sister at bedside 5/29.    Labs:  CBC: Recent  Labs  Lab 08/26/22 1420 08/26/22 1614 08/29/22 0439 08/30/22 0541 08/31/22 0404 09/01/22 0433 09/02/22 0509  WBC 11.7*   < > 6.0 7.6 7.7 8.7 7.6  NEUTROABS 10.8*  --   --   --   --   --   --   HGB 12.7*   < > 10.5* 11.3* 11.1* 10.6* 10.6*  HCT 41.8   < > 34.9* 36.0* 33.9* 33.8* 32.6*  MCV 89.9   < > 91.8 87.2 86.5 86.9 88.6  PLT 195   < > 171 191 180 178 186   < > = values in this interval not displayed.   Basic Metabolic Panel: Recent Labs  Lab 08/27/22 0208 08/27/22 0234 08/27/22 1441 08/27/22 2021 08/28/22 0155 08/28/22 0808 08/28/22 1644 08/28/22 1935 08/29/22 0439 08/29/22 0815 08/30/22 0541 08/30/22 0813 08/31/22 0404 08/31/22 0730 08/31/22 1401 08/31/22 2026 09/01/22 0433 09/01/22 1017 09/02/22 0509  NA 148*   < >  --    < > 156*   < >  --    < > 157*   < > 156*   < > 153* 151* 152* 151* 150*  --  143  K 3.5   < >  --   --  3.6  --   --   --  3.8  --  3.1*  --  3.3*  --   --   --  3.6  --  4.1  CL 119*  --   --   --  130*  --   --   --  129*  --  123*  --  121*  --   --   --  119*  --  113*  CO2 20*  --   --   --  20*  --   --   --  21*  --  22  --  23  --   --   --  23  --  19*  GLUCOSE 111*  --   --   --  104*  --   --   --  153*  --  126*  --  147*  --   --   --  177*  --  179*  BUN 14  --   --   --  16  --   --   --  26*  --  25*  --  35*  --   --   --  29*  --  27*  CREATININE 1.08  --   --   --  0.96  --   --   --  1.09  --  1.11  --  1.27*  --   --   --  1.06  --  1.03  CALCIUM 8.6*  --   --   --  8.0*  --   --   --  8.8*  --  9.1  --  8.7*  --   --   --  8.0*  --  8.2*  MG 2.1  --  2.2  --  2.3  --  2.7*  --   --   --   --   --   --   --   --   --   --  2.7* 3.0*  PHOS 1.4*  --  2.5  --  2.1*  --  2.6  --   --   --  3.4  --   --   --   --   --   --   --   --    < > = values in this interval not displayed.   GFR: Estimated Creatinine Clearance: 86 mL/min (by C-G formula based on SCr of 1.03 mg/dL). Recent Labs  Lab 08/26/22 1311 08/26/22 1420  08/26/22 1421 08/26/22 1510 08/27/22 0208 08/30/22 0541 08/31/22 0404 09/01/22 0433 09/02/22 0509  PROCALCITON  --   --   --  0.18  --   --   --   --   --   WBC  --    < >  --   --    < > 7.6 7.7 8.7 7.6  LATICACIDVEN 4.3*  --  4.4*  --   --   --   --   --   --    < > = values in this interval not displayed.    Liver Function Tests: Recent Labs  Lab 08/26/22 1420  AST 24  ALT 18  ALKPHOS 52  BILITOT 0.8  PROT 7.0  ALBUMIN 3.9   No results for input(s): "LIPASE", "AMYLASE" in the last 168 hours. No results for input(s): "AMMONIA" in the last 168 hours.  ABG:    Component Value Date/Time   PHART  7.459 (H) 08/27/2022 0234   PCO2ART 25.1 (L) 08/27/2022 0234   PO2ART 124 (H) 08/27/2022 0234   HCO3 17.7 (L) 08/27/2022 0234   TCO2 18 (L) 08/27/2022 0234   ACIDBASEDEF 4.0 (H) 08/27/2022 0234   O2SAT 99 08/27/2022 0234    Coagulation Profile: No results for input(s): "INR", "PROTIME" in the last 168 hours.  Cardiac Enzymes: No results for input(s): "CKTOTAL", "CKMB", "CKMBINDEX", "TROPONINI" in the last 168 hours.  HbA1C: Hgb A1c MFr Bld  Date/Time Value Ref Range Status  08/27/2022 02:08 AM 6.0 (H) 4.8 - 5.6 % Final    Comment:    (NOTE)         Prediabetes: 5.7 - 6.4         Diabetes: >6.4         Glycemic control for adults with diabetes: <7.0   08/26/2022 04:06 PM 5.6 4.8 - 5.6 % Final    Comment:    (NOTE) Pre diabetes:          5.7%-6.4%  Diabetes:              >6.4%  Glycemic control for   <7.0% adults with diabetes    CBG: Recent Labs  Lab 09/01/22 1151 09/01/22 1555 09/01/22 1925 09/01/22 2319 09/02/22 0302  GLUCAP 126* 136* 214* 154* 152*    Critical care time:    The patient is critically ill with multiple organ system failure and requires high complexity decision making for assessment and support, frequent evaluation and titration of therapies, advanced monitoring, review of radiographic studies and interpretation of complex data.    Critical Care Time devoted to patient care services, exclusive of separately billable procedures, described in this note is 32 minutes.   Posey Boyer, MSN, AG-ACNP-BC [facility] 09/02/2022, 8:17 AM  See Amion for pager If no response to pager, please call PCCM consult pager After 7:00 pm call Elink      From 7A-7P if no response, please call [phone] After hours, please call ELink [phone]

## 2022-09-02 NOTE — Progress Notes (Signed)
CPT with held at this time.  RT and RN taking patient to MRI.

## 2022-09-02 NOTE — Progress Notes (Signed)
LTM EEG D/C'd. Patient had no noted skin break down. [facility] notified.

## 2022-09-02 NOTE — Progress Notes (Signed)
Subjective: Patient still intubated and sedated, no changes overnight   Objective: Vital signs in last 24 hours: Temp:  [99.7 F (37.6 C)-101.7 F (38.7 C)] 99.7 F (37.6 C) (05/29 0945) Pulse Rate:  [78-101] 78 (05/29 0945) Resp:  [15-31] 18 (05/29 0945) BP: (110-187)/(66-91) 165/85 (05/29 0945) SpO2:  [93 %-96 %] 95 % (05/29 0945) FiO2 (%):  [40 %] 40 % (05/29 0749) Weight:  [96.7 kg] 96.7 kg (05/29 0500)  Intake/Output from previous day: 05/28 0701 - 05/29 0700 In: 2313.7 [I.V.:823.3; NG/GT:1190.4; IV Piggyback:300] Out: 2169 [Urine:1700; Drains:194; Stool:275] Intake/Output this shift: Total I/O In: 285.4 [I.V.:130.4; NG/GT:55; IV Piggyback:100] Out: 24 [Drains:24]  Neuro: withdraws LUE, no movement in lower extremities. Downward gaze  Lab Results: Lab Results  Component Value Date   WBC 7.6 09/02/2022   HGB 10.6 (L) 09/02/2022   HCT 32.6 (L) 09/02/2022   MCV 88.6 09/02/2022   PLT 186 09/02/2022   Lab Results  Component Value Date   INR 1.2 05/13/2021   BMET Lab Results  Component Value Date   NA 143 09/02/2022   K 4.1 09/02/2022   CL 113 (H) 09/02/2022   CO2 19 (L) 09/02/2022   GLUCOSE 179 (H) 09/02/2022   BUN 27 (H) 09/02/2022   CREATININE 1.03 09/02/2022   CALCIUM 8.2 (L) 09/02/2022    Studies/Results: Overnight EEG with video  Result Date: 09/02/2022 Charlsie Quest, MD     09/02/2022  7:21 AM Patient Name: Jimmy Marshall MRN: [id number] Epilepsy Attending: Charlsie Quest Referring Physician/Provider: Milon Dikes, MD Duration: 09/01/2022 0849 to 09/02/2022 0715  Patient history: 67yo M with left thalamic ICH now with seizure like activity. EEG to evaluate for seizure.  Level of alertness: lethargic/sedated  AEDs during EEG study: Propofol  Technical aspects: This EEG study was done with scalp electrodes positioned according to the 10-20 International system of electrode placement. Electrical activity was reviewed with band pass filter of 1-70Hz ,  sensitivity of 7 uV/mm, display speed of 15mm/sec with a 60Hz  notched filter applied as appropriate. EEG data were recorded continuously and digitally stored.  Video monitoring was available and reviewed as appropriate.  Description: No posterior dominant rhythm was seen. Sleep was characterized by sleep spindles 912-14hz ) maximal frontocentral region. EEG showed continuous generalized predominantly 5 -7Hz  theta slowing admixed with intermittent 2-3hz  delta slowing.  Hyperventilation and photic stimulation were not performed.   Intermittently throughout the study, patient was noted to have shoulder jerking every few seconds. Concomitant eeg didn't show any eeg change to suggest seizure.   ABNORMALITY - Continuous slow, generalized  IMPRESSION: This study is suggestive of severe diffuse encephalopathy, nonspecific etiology but could be related to sedation. No seizures or epileptiform discharges were seen throughout the recording. Intermittently throughout the study, patient was noted to have shoulder jerking every few seconds  lasting for about an hour without concomitant eeg change. These events were NON-epileptic.  Charlsie Quest    DG Chest Port 1 View  Result Date: 09/01/2022 CLINICAL DATA:  Ventilator support EXAM: PORTABLE CHEST 1 VIEW COMPARISON:  08/31/2022 FINDINGS: Endotracheal tube 3 cm above the carina. Orogastric or nasogastric tube enters the abdomen. Slightly improved aeration in both lower lobes. Upper lungs remain clear. IMPRESSION: Slight improvement in aeration in both lower lobes. Electronically Signed   By: Paulina Fusi M.D.   On: 09/01/2022 14:02   EEG adult  Result Date: 09/01/2022 Charlsie Quest, MD     09/01/2022  3:21 AM Patient Name: Jimmy Marshall MRN:  [id number] Epilepsy Attending: Charlsie Quest Referring Physician/Provider: Milon Dikes, MD Date: 09/01/2022 Duration: 23.05 mins Patient history: 67yo M with left thalamic ICH now with seizure like activity. EEG to evaluate  for seizure. Level of alertness: comatose AEDs during EEG study: LEV, Propofol Technical aspects: This EEG study was done with scalp electrodes positioned according to the 10-20 International system of electrode placement. Electrical activity was reviewed with band pass filter of 1-70Hz , sensitivity of 7 uV/mm, display speed of 65mm/sec with a 60Hz  notched filter applied as appropriate. EEG data were recorded continuously and digitally stored.  Video monitoring was available and reviewed as appropriate. Description: EEG showed continuous generalized 3 to 6 Hz theta-delta slowing admixed with brief 1-3 seconds of generalized attenuation. Hyperventilation and photic stimulation were not performed.   ABNORMALITY - Continuous slow, generalized IMPRESSION: This study is suggestive of severe diffuse encephalopathy, nonspecific etiology but could be related to sedation. No seizures or epileptiform discharges were seen throughout the recording. Charlsie Quest   DG Abd 1 View  Result Date: 08/31/2022 CLINICAL DATA:  Encounter to confirm orogastric tube placement. EXAM: ABDOMEN - 1 VIEW COMPARISON:  08/28/2022 FINDINGS: Tip and side port of the enteric tube below the diaphragm in the stomach. Nonobstructive upper abdominal bowel gas pattern. IMPRESSION: Tip and side port of the enteric tube below the diaphragm in the stomach. Electronically Signed   By: Narda Rutherford M.D.   On: 08/31/2022 18:08   DG CHEST PORT 1 VIEW  Result Date: 08/31/2022 CLINICAL DATA:  Endotracheal tube present. EXAM: PORTABLE CHEST 1 VIEW COMPARISON:  Radiograph earlier today FINDINGS: Tip of the endotracheal tube 3.5 cm from the carina. Enteric tube below the diaphragm. Left central line remains in position. Mildly lower lung volumes. Unchanged bibasilar opacities, left greater than right, with air bronchograms on the left. There may be a left pleural effusion. Stable heart size and mediastinal contours. No pneumothorax. IMPRESSION: 1.  Endotracheal tube tip 3.5 cm from the carina. Enteric tube below the diaphragm. 2. Unchanged bibasilar opacities, left greater than right, with air bronchograms on the left, atelectasis/collapse versus pneumonia. Possible left pleural effusion. Electronically Signed   By: Narda Rutherford M.D.   On: 08/31/2022 18:08   VAS [place] UPPER EXTREMITY VENOUS DUPLEX  Result Date: 08/31/2022 UPPER VENOUS STUDY  Patient Name:  Jimmy Marshall  Date of Exam:   08/31/2022 Medical Rec #: [id number]        Accession #:    [id number] Date of Birth: [birth year]       Patient Gender: M Patient Age:   43 years Exam Location:  [facility] Procedure:      VAS [place] UPPER EXTREMITY VENOUS DUPLEX Referring Phys: Felisa Bonier --------------------------------------------------------------------------------  Indications: Right arm swelling, erythema Comparison Study: No prior studies. Performing Technologist: Jean Rosenthal RDMS, RVT  Examination Guidelines: A complete evaluation includes B-mode imaging, spectral Doppler, color Doppler, and power Doppler as needed of all accessible portions of each vessel. Bilateral testing is considered an integral part of a complete examination. Limited examinations for reoccurring indications may be performed as noted.  Right Findings: +----------+------------+---------+-----------+----------+-------+ RIGHT     CompressiblePhasicitySpontaneousPropertiesSummary +----------+------------+---------+-----------+----------+-------+ IJV           Full       Yes       Yes                      +----------+------------+---------+-----------+----------+-------+ Subclavian  Yes       Yes                      +----------+------------+---------+-----------+----------+-------+ Axillary      Full                                          +----------+------------+---------+-----------+----------+-------+ Brachial      None       No        No                Acute   +----------+------------+---------+-----------+----------+-------+ Radial        Full                                          +----------+------------+---------+-----------+----------+-------+ Ulnar         Full                                          +----------+------------+---------+-----------+----------+-------+ Cephalic      Full                                          +----------+------------+---------+-----------+----------+-------+ Basilic       Full                                          +----------+------------+---------+-----------+----------+-------+  Left Findings: +----------+------------+---------+-----------+----------+-------+ LEFT      CompressiblePhasicitySpontaneousPropertiesSummary +----------+------------+---------+-----------+----------+-------+ Subclavian               Yes       Yes                      +----------+------------+---------+-----------+----------+-------+  Summary:  Right: Findings consistent with acute deep vein thrombosis involving the right brachial veins.  Left: No evidence of thrombosis in the subclavian.  *See table(s) above for measurements and observations.  Diagnosing physician: Waverly Ferrari MD Electronically signed by Waverly Ferrari MD on 08/31/2022 at 6:00:07 PM.    Final    DG CHEST PORT 1 VIEW  Result Date: 08/31/2022 CLINICAL DATA:  Mucous plugging of bronchi. EXAM: PORTABLE CHEST 1 VIEW COMPARISON:  AP chest 08/30/2022 and 08/29/2022 FINDINGS: Endotracheal tube tip terminates approximately 6.5 cm above the carina, at the level of the inferior aspect of the clavicular heads. This appears to be mildly retracted from prior. Left upper extremity PICC tip terminates overlying the superior vena cava/right atrial junction, new from prior. Enteric tube descends below the diaphragm with the tip excluded by collimation, similar to prior. Cardiac silhouette and mediastinal contours are unchanged and within normal  limits. Mildly to mildly decreased lung volumes. Left basilar horizontal linear opacities are similar to multiple prior radiographs. Additional heterogeneous retrocardiac opacity. No pneumothorax. No acute skeletal abnormality. IMPRESSION: 1. Endotracheal tube tip terminates approximately 6.5 cm above the carina, at the level of the inferior aspect of the clavicular heads. This appears to be mildly retracted from prior. 2. Left upper extremity PICC  tip terminates overlying the superior vena cava/right atrial junction, new from prior. 3. Left basilar streaky heterogeneous opacity, atelectasis versus pneumonia, similar to prior. Electronically Signed   By: Neita Garnet M.D.   On: 08/31/2022 16:44    Assessment/Plan: EVD still draining appropriately. Recommend family discussion for goals of care.    LOS: 7 days    Tiana Loft Sukhraj Esquivias 09/02/2022, 10:03 AM

## 2022-09-02 NOTE — Progress Notes (Signed)
Settings changed per Dr. Mervyn Skeeters

## 2022-09-02 NOTE — Progress Notes (Signed)
vLTM maintenance. All impedances below 25 kohms.  Tracing clear of artifact in all channels.  Patient has large dressing on right side of head.  No skin breakdown noted at FP1 FP2  Cz  P3 C3

## 2022-09-02 NOTE — Progress Notes (Signed)
STROKE TEAM PROGRESS NOTE   INTERVAL HISTORY Sister is at the bedside. Later Dr. Denese Killings talked with Daughter. Pt off sedation today, briefly open eyes on voice but still not following commands, or tracking. Mild withdraw left UE and LE on pain stimulation. EVD draining well. LTM EEG no seizure, will d/c.    Vitals:   09/02/22 1800 09/02/22 1900 09/02/22 2000 09/02/22 2007  BP:  (!) 142/78 (!) 162/90   Pulse: 99 82 75   Resp: (!) 24 18 18    Temp: (!) 100.9 F (38.3 C) 100 F (37.8 C) (!) 101.5 F (38.6 C)   TempSrc:      SpO2: 94% 98% 99% 99%  Weight:      Height:       CBC:  Recent Labs  Lab 09/01/22 0433 09/02/22 0509  WBC 8.7 7.6  HGB 10.6* 10.6*  HCT 33.8* 32.6*  MCV 86.9 88.6  PLT 178 186   Basic Metabolic Panel:  Recent Labs  Lab 08/28/22 1644 08/28/22 1935 08/30/22 0541 08/30/22 0813 09/01/22 0433 09/01/22 1017 09/02/22 0509  NA  --    < > 156*   < > 150*  --  143  K  --    < > 3.1*   < > 3.6  --  4.1  CL  --    < > 123*   < > 119*  --  113*  CO2  --    < > 22   < > 23  --  19*  GLUCOSE  --    < > 126*   < > 177*  --  179*  BUN  --    < > 25*   < > 29*  --  27*  CREATININE  --    < > 1.11   < > 1.06  --  1.03  CALCIUM  --    < > 9.1   < > 8.0*  --  8.2*  MG 2.7*  --   --   --   --  2.7* 3.0*  PHOS 2.6  --  3.4  --   --   --   --    < > = values in this interval not displayed.   Lipid Panel:  Recent Labs  Lab 08/27/22 0208 08/30/22 0541 09/02/22 0832  CHOL 155  --   --   TRIG 105  114   < > 193*  HDL 33*  --   --   CHOLHDL 4.7  --   --   VLDL 21  --   --   LDLCALC 119*  --   --    < > = values in this interval not displayed.   HgbA1c:  Recent Labs  Lab 08/27/22 0208  HGBA1C 6.0*   Urine Drug Screen:  Recent Labs  Lab 08/29/22 1018  LABOPIA NONE DETECTED  COCAINSCRNUR NONE DETECTED  LABBENZ NONE DETECTED  AMPHETMU NONE DETECTED  THCU POSITIVE*  LABBARB NONE DETECTED    Alcohol Level No results for input(s): "ETH" in the last  168 hours.  IMAGING past 24 hours Overnight EEG with video  Result Date: 09/02/2022 Charlsie Quest, MD     09/02/2022  2:05 PM Patient Name: Ovel Guba MRN: [id number] Epilepsy Attending: Charlsie Quest Referring Physician/Provider: Milon Dikes, MD Duration: 09/01/2022 1308 to 09/02/2022 0849  Patient history: 67yo M with left thalamic ICH now with seizure like activity. EEG to evaluate for seizure.  Level of alertness: lethargic/sedated  AEDs during EEG study: Propofol  Technical aspects: This EEG study was done with scalp electrodes positioned according to the 10-20 International system of electrode placement. Electrical activity was reviewed with band pass filter of 1-70Hz , sensitivity of 7 uV/mm, display speed of 58mm/sec with a 60Hz  notched filter applied as appropriate. EEG data were recorded continuously and digitally stored.  Video monitoring was available and reviewed as appropriate.  Description: No posterior dominant rhythm was seen. Sleep was characterized by sleep spindles 912-14hz ) maximal frontocentral region. EEG showed continuous generalized predominantly 5 -7Hz  theta slowing admixed with intermittent 2-3hz  delta slowing.  Hyperventilation and photic stimulation were not performed.   Intermittently throughout the study, patient was noted to have shoulder jerking every few seconds. Concomitant eeg didn't show any eeg change to suggest seizure.   ABNORMALITY - Continuous slow, generalized  IMPRESSION: This study is suggestive of severe diffuse encephalopathy, nonspecific etiology but could be related to sedation. No seizures or epileptiform discharges were seen throughout the recording. Intermittently throughout the study, patient was noted to have shoulder jerking every few seconds  lasting for about an hour without concomitant eeg change. These events were NON-epileptic.  Priyanka Annabelle Harman     PHYSICAL EXAM  Temp:  [96.8 F (36 C)-101.7 F (38.7 C)] 101.5 F (38.6 C) (05/29  2000) Pulse Rate:  [75-101] 75 (05/29 2000) Resp:  [14-31] 18 (05/29 2000) BP: (110-187)/(58-143) 162/90 (05/29 2000) SpO2:  [91 %-99 %] 99 % (05/29 2007) FiO2 (%):  [40 %-50 %] 50 % (05/29 2007) Weight:  [96.7 kg] 96.7 kg (05/29 0500)  General - Well nourished, well developed, intubated off sedation.  Ophthalmologic - fundi not visualized due to noncooperation.  Cardiovascular - Regular rhythm and rate  Neuro - intubated off sedation today, eyes briefly open on voice and tactile stimulatoin, not following commands. With forced eye opening, b/l eyes downward gaze, not blinking to visual threat, doll's eyes sluggish, not tracking, b/l pupil 2mm, sluggish to light. Corneal reflex weakly present bilaterally, gag and cough present. Breathing over the vent.  Facial symmetry not able to test due to ET tube.  Tongue protrusion not cooperative. On pain stimulation, left UE mild withdraw and LLE slight withdraw. RUE and RLE flaccid. Sensation, coordination and gait not tested.    ASSESSMENT/PLAN Mr. Perris Amjad is a 67 y.o. 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 S/p EVD, Ventricular system may be slightly smaller but otherwise no significant improvement. Mild new rightward midline shift, 2-3 mm CTA head  & neck no AVM or aneurysm CT repeat showed stable left thalamic ICH with large IVH.  Hydrocephalus has improved after EVD placement. MRI pending 2D Echo LVEF 50-55% EEG no seizure LDL 101 HgbA1c 5.6  UDS - positive for THC EEG suggestive of severe diffuse encephalopathy of nonspecific etiology though likely related to sedation.  No seizures or epileptiform discharges seen throughout recording. VTE prophylaxis -  heparin subq No antithrombotic prior to admission, now on No antithrombotic due to IPH Completed keppra for 7 days for seizure prevention Therapy recommendations:  pending Disposition:  GOC discussion with daughter yesterday, she would not want trach or PEG if not able to wean off vent  Seizure like activity 5/28 recurrent episode of whole body jerking Stat EEG no seizure Off sedation today LTM EEG no seizure, will d/c  Hydrocephalus s/p EVD placement Cerebral edema On HTS 3% s/p 250 mL bolus, 75 cc/h gtt -> off Na 148 > 152->156->153 ->152->150->143 Off free water CT repeat showed hydrocephalus has improved MRI pending Allow Na trending down slowly  Respiratory failure Fever ?  Aspiration pneumonia CCM on board for vent management Tmax 102.1->100.9->101.5 WBC 7.7->8.7->7.6 On Unasyn  Hypertensive emergency Home meds:  losartan, diltiazem Stable now on cleviprex gtt On losartan 100, amlodipine 10, and spironolactone 25 PRN labetalol and hydralazine for hypertension  Blood pressure goal systolic < 160 Long-term BP goal normotensive  Hyperlipidemia Home meds: None LDL 101, goal < 70 Consider adding statin at discharge  Other Stroke Risk Factors Substance abuse - UDS:  THC POSITIVE. Patient will be advised to stop using due to stroke risk.  Other Active Problems Schizoaffective disorder, depressive type On Zyprexa Glaucoma Aortic aneurysm   Hospital day # 7  This patient is critically ill due to ICH, IVH and  hydrocephalus, respiratory failure,  hypertensive emergency and at significant risk of neurological worsening, death form hematoma expansion, brain herniation, obstructive hydrocephalus, status epilepticus. This patient's care requires constant monitoring of vital signs, hemodynamics, respiratory and cardiac monitoring, review of multiple databases, neurological assessment, discussion with family, other specialists and medical decision making of high complexity. I spent 35 minutes of neurocritical care time in the care of this patient. I had long discussion with sister at bedside, updated pt current condition, treatment plan and potential prognosis, and answered all the questions. She expressed understanding and appreciation.    Marvel Plan, MD PhD Stroke Neurology 09/02/2022 9:35 PM   To contact Stroke Continuity provider, please refer to [email or web address]. After hours, contact General Neurology

## 2022-09-02 NOTE — Procedures (Addendum)
Patient Name: Jimmy Marshall  MRN: [id number]  Epilepsy Attending: Charlsie Quest  Referring Physician/Provider: Milon Dikes, MD  Duration: 09/01/2022 4098 to 09/02/2022 1191   Patient history: 67yo M with left thalamic ICH now with seizure like activity. EEG to evaluate for seizure.   Level of alertness: lethargic/sedated   AEDs during EEG study: Propofol   Technical aspects: This EEG study was done with scalp electrodes positioned according to the 10-20 International system of electrode placement. Electrical activity was reviewed with band pass filter of 1-70Hz , sensitivity of 7 uV/mm, display speed of 13mm/sec with a 60Hz  notched filter applied as appropriate. EEG data were recorded continuously and digitally stored.  Video monitoring was available and reviewed as appropriate.   Description: No posterior dominant rhythm was seen. Sleep was characterized by sleep spindles 912-14hz ) maximal frontocentral region. EEG showed continuous generalized predominantly 5 -7Hz  theta slowing admixed with intermittent 2-3hz  delta slowing.  Hyperventilation and photic stimulation were not performed.     Intermittently throughout the study, patient was noted to have shoulder jerking every few seconds. Concomitant eeg didn't show any eeg change to suggest seizure.     ABNORMALITY - Continuous slow, generalized   IMPRESSION: This study is suggestive of severe diffuse encephalopathy, nonspecific etiology but could be related to sedation. No seizures or epileptiform discharges were seen throughout the recording.  Intermittently throughout the study, patient was noted to have shoulder jerking every few seconds  lasting for about an hour without concomitant eeg change. These events were NON-epileptic.    [name]

## 2022-09-03 ENCOUNTER — Inpatient Hospital Stay (HOSPITAL_COMMUNITY): Payer: No Typology Code available for payment source

## 2022-09-03 DIAGNOSIS — I610 Nontraumatic intracerebral hemorrhage in hemisphere, subcortical: Secondary | ICD-10-CM | POA: Diagnosis not present

## 2022-09-03 DIAGNOSIS — G911 Obstructive hydrocephalus: Secondary | ICD-10-CM | POA: Diagnosis not present

## 2022-09-03 LAB — CBC
HCT: 30.9 % — ABNORMAL LOW (ref 39.0–52.0)
Hemoglobin: 9.6 g/dL — ABNORMAL LOW (ref 13.0–17.0)
MCH: 27 pg (ref 26.0–34.0)
MCHC: 31.1 g/dL (ref 30.0–36.0)
MCV: 87 fL (ref 80.0–100.0)
Platelets: 168 10*3/uL (ref 150–400)
RBC: 3.55 MIL/uL — ABNORMAL LOW (ref 4.22–5.81)
RDW: 18.5 % — ABNORMAL HIGH (ref 11.5–15.5)
WBC: 7.2 10*3/uL (ref 4.0–10.5)
nRBC: 0 % (ref 0.0–0.2)

## 2022-09-03 LAB — POCT I-STAT 7, (LYTES, BLD GAS, ICA,H+H)
Acid-base deficit: 2 mmol/L (ref 0.0–2.0)
Bicarbonate: 22.5 mmol/L (ref 20.0–28.0)
Calcium, Ion: 1.24 mmol/L (ref 1.15–1.40)
HCT: 31 % — ABNORMAL LOW (ref 39.0–52.0)
Hemoglobin: 10.5 g/dL — ABNORMAL LOW (ref 13.0–17.0)
O2 Saturation: 98 %
Patient temperature: 37.9
Potassium: 3.8 mmol/L (ref 3.5–5.1)
Sodium: 149 mmol/L — ABNORMAL HIGH (ref 135–145)
TCO2: 24 mmol/L (ref 22–32)
pCO2 arterial: 37.6 mmHg (ref 32–48)
pH, Arterial: 7.389 (ref 7.35–7.45)
pO2, Arterial: 105 mmHg (ref 83–108)

## 2022-09-03 LAB — GLUCOSE, CAPILLARY
Glucose-Capillary: 199 mg/dL — ABNORMAL HIGH (ref 70–99)
Glucose-Capillary: 156 mg/dL — ABNORMAL HIGH (ref 70–99)
Glucose-Capillary: 194 mg/dL — ABNORMAL HIGH (ref 70–99)
Glucose-Capillary: 221 mg/dL — ABNORMAL HIGH (ref 70–99)
Glucose-Capillary: 296 mg/dL — ABNORMAL HIGH (ref 70–99)
Glucose-Capillary: 258 mg/dL — ABNORMAL HIGH (ref 70–99)

## 2022-09-03 LAB — BASIC METABOLIC PANEL
Anion gap: 9 (ref 5–15)
BUN: 30 mg/dL — ABNORMAL HIGH (ref 8–23)
CO2: 22 mmol/L (ref 22–32)
Calcium: 8.2 mg/dL — ABNORMAL LOW (ref 8.9–10.3)
Chloride: 115 mmol/L — ABNORMAL HIGH (ref 98–111)
Creatinine, Ser: 1.07 mg/dL (ref 0.61–1.24)
GFR, Estimated: >60 mL/min (ref >60)
Glucose, Bld: 243 mg/dL — ABNORMAL HIGH (ref 70–99)
Potassium: 3.7 mmol/L (ref 3.5–5.1)
Sodium: 146 mmol/L — ABNORMAL HIGH (ref 135–145)

## 2022-09-03 MED ORDER — FUROSEMIDE 10 MG/ML IJ SOLN
40.0000 mg | Freq: Once | INTRAMUSCULAR | Status: AC
  Administered 2022-09-03: 40 mg via INTRAVENOUS
  Filled 2022-09-03: qty 4

## 2022-09-03 MED ORDER — LOSARTAN POTASSIUM 50 MG PO TABS
150.0000 mg | ORAL_TABLET | Freq: Every day | ORAL | Status: DC
  Administered 2022-09-04 – 2022-09-05 (×2): 150 mg
  Filled 2022-09-03 (×2): qty 3

## 2022-09-03 MED ORDER — POTASSIUM CHLORIDE 20 MEQ PO PACK
40.0000 meq | PACK | Freq: Once | ORAL | Status: AC
  Administered 2022-09-03: 40 meq
  Filled 2022-09-03: qty 2

## 2022-09-03 MED ORDER — HYDRALAZINE HCL 25 MG PO TABS
25.0000 mg | ORAL_TABLET | Freq: Three times a day (TID) | ORAL | Status: DC
  Administered 2022-09-03 – 2022-09-04 (×3): 25 mg
  Filled 2022-09-03 (×3): qty 1

## 2022-09-03 MED ORDER — POTASSIUM CHLORIDE 20 MEQ PO PACK
20.0000 meq | PACK | Freq: Once | ORAL | Status: AC
  Administered 2022-09-03: 20 meq
  Filled 2022-09-03: qty 1

## 2022-09-03 MED ORDER — GADOBUTROL 1 MMOL/ML IV SOLN
9.5000 mL | Freq: Once | INTRAVENOUS | Status: AC | PRN
  Administered 2022-09-03: 9.5 mL via INTRAVENOUS

## 2022-09-03 NOTE — Progress Notes (Signed)
Patient's BP has been climbing steadily today. PRNs given and Clevi gtt titrated up throughout the day. Per tube meds have been adjusted per MD this am. Neuro and CCM notified. Verbal order to put EVD back to 10 H2O. Drain has been putting out 5-65ml/hr consistently. Will go for head CT if no changes in pressure within the hour. Neuro exam is the same. Lorel Lembo, [place] Scrape, RN

## 2022-09-03 NOTE — Progress Notes (Signed)
Nutrition Follow-up  DOCUMENTATION CODES:   Not applicable  INTERVENTION:   Tube feeding via OG tube: Osmolite 1.5 @ 55 ml/hr (1320 ml per day) Prosource TF20 60 ml BID  Provides 2140 kcal, 122 gm protein, 1003 ml free water daily   NUTRITION DIAGNOSIS:   Increased nutrient needs related to acute illness as evidenced by estimated needs. Ongoing.   GOAL:   Patient will meet greater than or equal to 90% of their needs Met with TF at goal.   MONITOR:   Vent status, TF tolerance  REASON FOR ASSESSMENT:   Consult Enteral/tube feeding initiation and management  ASSESSMENT:   Pt with PMH of HTN, thoracic aortic intramural hematoma and descending thoracic aortic aneurysm (08/2021), schizoaffective disorder, schizophrenia, DM, and GERD admitted after being found down in the hotel where he was living with large L thalamic IPH with intraventricular extension per CT.    Pt discussed during ICU rounds and with RN and MD.   Neurosurgery plans for repeat angio today and raise EVD.  Per neuro GOC sister does not want trach and PEG if not weaned from vent.  Medications reviewed and include: colace, SSI, protonix, miralax Fentanyl  Hypertonic saline @ 75 ml/hr  Labs reviewed:  Na 148 CBG's: 133-201  EVD: 195 ml  18 F OG tube; per xray in gastric body  Diet Order:   Diet Order             Diet NPO time specified  Diet effective now                   EDUCATION NEEDS:   No education needs have been identified at this time  Skin:  Skin Assessment: Reviewed RN Assessment  Last BM:  unknown  Height:   Ht Readings from Last 1 Encounters:  08/26/22 5\' 11"  (1.803 m)    Weight:   Wt Readings from Last 1 Encounters:  09/03/22 99.2 kg    BMI:  Body mass index is 30.5 kg/m.  Estimated Nutritional Needs:   Kcal:  2100-2300  Protein:  110-125 grams  Fluid:  >2 L/day  Cammy Copa., RD, LDN, CNSC See AMiON for contact information

## 2022-09-03 NOTE — Progress Notes (Addendum)
STROKE TEAM PROGRESS NOTE   INTERVAL HISTORY No family is at the bedside initially but later sister arrived. Pt still on sedation, less responsive than yesterday without sedation. Still on EVD drain, NSG has raised pressure level up to 15. Still has fever. Per RN, daughter arrived in pm and leaning towards one way extubation on Saturday.    Vitals:   09/03/22 2145 09/03/22 2146 09/03/22 2200 09/03/22 2215  BP: (!) 147/72 (!) 147/72 (!) 160/79 (!) 148/74  Pulse: 90 90 94 88  Resp: 15  17 13   Temp: (!) 100.6 F (38.1 C)  (!) 100.6 F (38.1 C) (!) 100.9 F (38.3 C)  TempSrc:   Esophageal   SpO2: 95%  96% 96%  Weight:      Height:       CBC:  Recent Labs  Lab 09/02/22 0509 09/03/22 0639 09/03/22 0859  WBC 7.6 7.2  --   HGB 10.6* 9.6* 10.5*  HCT 32.6* 30.9* 31.0*  MCV 88.6 87.0  --   PLT 186 168  --    Basic Metabolic Panel:  Recent Labs  Lab 08/28/22 1644 08/28/22 1935 08/30/22 0541 08/30/22 0813 09/01/22 1017 09/02/22 0509 09/03/22 0639 09/03/22 0859  NA  --    < > 156*   < >  --  143 146* 149*  K  --    < > 3.1*   < >  --  4.1 3.7 3.8  CL  --    < > 123*   < >  --  113* 115*  --   CO2  --    < > 22   < >  --  19* 22  --   GLUCOSE  --    < > 126*   < >  --  179* 243*  --   BUN  --    < > 25*   < >  --  27* 30*  --   CREATININE  --    < > 1.11   < >  --  1.03 1.07  --   CALCIUM  --    < > 9.1   < >  --  8.2* 8.2*  --   MG 2.7*  --   --   --  2.7* 3.0*  --   --   PHOS 2.6  --  3.4  --   --   --   --   --    < > = values in this interval not displayed.   Lipid Panel:  Recent Labs  Lab 09/02/22 0832  TRIG 193*   HgbA1c:  No results for input(s): "HGBA1C" in the last 168 hours.  Urine Drug Screen:  Recent Labs  Lab 08/29/22 1018  LABOPIA NONE DETECTED  COCAINSCRNUR NONE DETECTED  LABBENZ NONE DETECTED  AMPHETMU NONE DETECTED  THCU POSITIVE*  LABBARB NONE DETECTED    Alcohol Level No results for input(s): "ETH" in the last 168 hours.  IMAGING past  24 hours CT HEAD WO CONTRAST ( )  Result Date: 09/03/2022 CLINICAL DATA:  Neuro deficit, acute, stroke suspected. EXAM: CT HEAD WITHOUT CONTRAST TECHNIQUE: Contiguous axial images were obtained from the base of the skull through the vertex without intravenous contrast. RADIATION DOSE REDUCTION: This exam was performed according to the departmental dose-optimization program which includes automated exposure control, adjustment of the mA and/or kV according to patient size and/or use of iterative reconstruction technique. COMPARISON:  Head CT 08/31/2022. FINDINGS: Brain: Unchanged appearance of left  thalamic hematoma with intraventricular extension casting the ventricular system. Similar position of a right frontal approach ventriculostomy catheter terminating in the anterior body of the right lateral ventricle. Continued decrease in dilation of the lateral and third ventricles. Unchanged trace subarachnoid hemorrhage along the right superior temporal sulcus. No new hemorrhage or new loss of gray-white differentiation. Vascular: No hyperdense vessel or unexpected calcification. Skull: Right frontal burr hole. No calvarial fracture. Skull base is unremarkable. Sinuses/Orbits: Extensive opacification of the paranasal sinuses, nasal cavity and pharynx, most likely secondary to endotracheal intubation. Orbits are unremarkable. Other: None. IMPRESSION: 1. Unchanged appearance of left thalamic hematoma with intraventricular extension casting the ventricular system. Similar position of a right frontal approach ventriculostomy catheter with continued decrease in dilation of the lateral and third ventricles. 2. Unchanged trace subarachnoid hemorrhage along the right superior temporal sulcus. 3. No new hemorrhage or new loss of gray-white differentiation. Electronically Signed   By: Orvan Falconer M.D.   On: 09/03/2022 18:05   DG CHEST PORT 1 VIEW  Result Date: 09/03/2022 CLINICAL DATA:  History of intracranial  hemorrhage, endotracheal tube in place EXAM: PORTABLE CHEST 1 VIEW COMPARISON:  Chest x-ray Sep 01, 2022 FINDINGS: Stable support apparatus. Unchanged enlarged mediastinal and cardiac contours. Low lung volumes with bronchovascular crowding. Unchanged bibasilar pulmonary opacities. Small bilateral pleural effusions. No large pneumothorax. No acute osseous abnormality. The visualized upper abdomen is unremarkable. IMPRESSION: No significant change from prior examination. Electronically Signed   By: Jacob Moores M.D.   On: 09/03/2022 09:41   MR BRAIN W WO CONTRAST  Result Date: 09/03/2022 CLINICAL DATA:  Hemorrhagic stroke EXAM: MRI HEAD WITHOUT AND WITH CONTRAST TECHNIQUE: Multiplanar, multiecho pulse sequences of the brain and surrounding structures were obtained without and with intravenous contrast. CONTRAST:  9.27mL GADAVIST GADOBUTROL 1 MMOL/ML IV SOLN COMPARISON:  08/31/2022 CT head FINDINGS: Brain: Redemonstrated intraparenchymal hemorrhage centered in the left thalamus, which measures 1.7 x 3.3 x 2.7 cm (AP x TR x CC) (series 16, image 32 and series 11, image 16), previously 1.5 x 3.2 x 2.8 cm, overall unchanged when accounting for differences in technique. Surrounding T2 hyperintense signal with some enhancement, likely edema. Unchanged 7 mm of left-to-right midline shift. Redemonstrated extension into the left greater than right lateral ventricle, third ventricle, and fourth ventricle. Unchanged size and configuration the ventricles when accounting for differences in technique, status post right frontal approach drain placement. 7 mm focus of restricted diffusion with ADC correlate in the periventricular white matter adjacent to right ventricle atrium (series 5, image 83), concerning for acute infarct. No abnormal parenchymal or meningeal enhancement. The basilar cisterns are patent. Additional foci of hemosiderin deposition in the right temporal lobe (series 14, images 30, 34, and 36), in the  posterior right hippocampus (series 14, image 26), and in the superior cerebellum (series 14, image 28). Vascular: Normal arterial flow voids. Normal arterial and venous enhancement. Skull and upper cervical spine: Right frontal burr hole. Otherwise normal marrow signal. Sinuses/Orbits: Diffuse opacification of the paranasal sinuses, likely related to intubation. No acute finding in the orbits. Other: Fluid in the bilateral mastoid air cells, also likely related to intubation. IMPRESSION: 1. 7 mm focus of restricted diffusion in the periventricular white matter adjacent to the right ventricle atrium, concerning for acute infarct. 2. Redemonstrated intraparenchymal hemorrhage centered in the left thalamus, with extension into the left greater than right lateral ventricle, third ventricle, and fourth ventricle. Unchanged 7 mm of left-to-right midline shift. 3. Unchanged size and configuration of  the ventricles when accounting for differences in technique, status post right frontal approach drain placement. These results will be called to the ordering clinician or representative by the Radiologist Assistant, and communication documented in the PACS or [employer]. Electronically Signed   By: Wiliam Ke M.D.   On: 09/03/2022 03:21    PHYSICAL EXAM  Temp:  [99.7 F (37.6 C)-100.9 F (38.3 C)] 100.9 F (38.3 C) (05/30 2215) Pulse Rate:  [77-97] 88 (05/30 2215) Resp:  [0-31] 13 (05/30 2215) BP: (114-179)/(64-116) 148/74 (05/30 2215) SpO2:  [93 %-99 %] 96 % (05/30 2215) FiO2 (%):  [40 %-50 %] 40 % (05/30 1959) Weight:  [99.2 kg] 99.2 kg (05/30 0705)  General - Well nourished, well developed, intubated on sedation.  Ophthalmologic - fundi not visualized due to noncooperation.  Cardiovascular - Regular rhythm and rate  Neuro - intubated on sedation, eyes closed, not open on voice, not following commands. With forced eye opening, b/l eyes mid position, not blinking to visual threat, doll's eyes  sluggish, not tracking, b/l pupil 2mm, sluggish to light. Corneal reflex weakly present bilaterally, gag and cough present. Breathing over the vent.  Facial symmetry not able to test due to ET tube.  Tongue protrusion not cooperative. On pain stimulation, slight withdraw on the LUE and LLE. Sensation, coordination and gait not tested.    ASSESSMENT/PLAN [name] is a 67 y.o. 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 S/p EVD, Ventricular system may be slightly smaller but otherwise no significant improvement. Mild new rightward midline shift, 2-3 mm CTA head & neck no AVM or aneurysm CT repeat showed stable left thalamic ICH with large IVH.  Hydrocephalus has improved after EVD placement. MRI stable left thalamic ICH with extensive IVH and MLS. Small right ventricle atrium WM infarct.  2D Echo LVEF 50-55% EEG no seizure LDL 101 HgbA1c 5.6  UDS - positive for THC EEG suggestive of severe diffuse encephalopathy of nonspecific etiology though likely related to sedation.  No seizures or epileptiform discharges seen throughout recording. VTE prophylaxis - heparin subq No antithrombotic prior to admission, now on No antithrombotic due to IPH Completed keppra for 7 days for seizure prevention Therapy  recommendations:  pending Disposition:  GOC discussion with daughter, she would not want trach or PEG if not able to wean off vent, tentative plan of one way extubation on Saturday  Seizure like activity 5/28 recurrent episode of whole body jerking Stat EEG no seizure Off sedation today LTM EEG no seizure, d/c'ed  Hydrocephalus s/p EVD placement Cerebral edema On HTS 3% s/p 250 mL bolus, 75 cc/h gtt -> off Na 148 > 152->156->153 ->152->150->143->149 Off free water CT repeat showed hydrocephalus has improved MRI stable left thalamic ICH with extensive IVH and MLS.  Allow Na trending down slowly  Respiratory failure Fever ?  Aspiration pneumonia CCM on board for vent management Tmax 102.1->100.9->101.5->101.3 WBC 7.7->8.7->7.6->7.2  On Unasyn  Hypertensive emergency Home meds:  losartan, diltiazem Stable now on cleviprex gtt On losartan 100, amlodipine 10, and spironolactone 25 PRN labetalol and hydralazine for hypertension  Blood pressure goal systolic < 160 Long-term BP goal normotensive  Hyperlipidemia Home meds: None LDL 101, goal < 70 Consider adding statin at discharge  Other Stroke Risk Factors Substance abuse - UDS:  THC POSITIVE. Patient will be advised to stop using due to stroke risk.  Other Active Problems Schizoaffective disorder, depressive type On Zyprexa Glaucoma Aortic aneurysm   Hospital day # 8  This patient is critically ill due to ICH, IVH and  hydrocephalus, respiratory failure, hypertensive emergency and at significant risk of neurological worsening, death form hematoma expansion, brain herniation, obstructive hydrocephalus, status epilepticus. This patient's care requires constant monitoring of vital signs, hemodynamics, respiratory and cardiac monitoring, review of multiple databases, neurological assessment, discussion with family, other specialists and medical decision making of high complexity. I spent 35 minutes of neurocritical care time  in the care of this patient.    Marvel Plan, MD PhD Stroke Neurology 09/03/2022 10:39 PM   To contact Stroke Continuity provider, please refer to [email or web address]. After hours, contact General Neurology

## 2022-09-03 NOTE — Progress Notes (Signed)
CCM progress note  Called by bedside RN for difficult to control hypertension despite continued continuous sedation and multiple enteral agents.  Now on 16 of Cleviprex.  Neurological exam remains unchanged. No improvement in blood pressure with decreased drain level to 10 cm of CSF.  Sent for head CT to rule out worsening intracranial hypertension.  CT head shows stable hematoma but better defined brainstem infarction.  Resting hypertension of unclear source.  Continue sedation current therapy.  Will continue increase enteral agents.  Await final CT read.

## 2022-09-03 NOTE — Progress Notes (Addendum)
Subjective: Patient intubated and sedated  Objective: Vital signs in last 24 hours: Temp:  [96.8 F (36 C)-101.5 F (38.6 C)] 100.4 F (38 C) (05/30 0700) Pulse Rate:  [75-99] 89 (05/30 0700) Resp:  [0-30] 14 (05/30 0700) BP: (114-187)/(58-143) 133/70 (05/30 0700) SpO2:  [91 %-99 %] 95 % (05/30 0754) FiO2 (%):  [50 %] 50 % (05/30 0754) Weight:  [99.2 kg] 99.2 kg (05/30 0705)  Intake/Output from previous day: 05/29 0701 - 05/30 0700 In: 4473.1 [I.V.:666.9; ZO/XW:9604; IV Piggyback:451.1] Out: 1570 [Urine:1275; Drains:195; Stool:100] Intake/Output this shift: No intake/output data recorded.  Neuro: no change in neuro status, still some withdrawal in LUE, right hemiparetic  Lab Results: Lab Results  Component Value Date   WBC 7.2 09/03/2022   HGB 9.6 (L) 09/03/2022   HCT 30.9 (L) 09/03/2022   MCV 87.0 09/03/2022   PLT 168 09/03/2022   Lab Results  Component Value Date   INR 1.2 05/13/2021   BMET Lab Results  Component Value Date   NA 146 (H) 09/03/2022   K 3.7 09/03/2022   CL 115 (H) 09/03/2022   CO2 22 09/03/2022   GLUCOSE 243 (H) 09/03/2022   BUN 30 (H) 09/03/2022   CREATININE 1.07 09/03/2022   CALCIUM 8.2 (L) 09/03/2022    Studies/Results: MR BRAIN W WO CONTRAST  Result Date: 09/03/2022 CLINICAL DATA:  Hemorrhagic stroke EXAM: MRI HEAD WITHOUT AND WITH CONTRAST TECHNIQUE: Multiplanar, multiecho pulse sequences of the brain and surrounding structures were obtained without and with intravenous contrast. CONTRAST:  9.74mL GADAVIST GADOBUTROL 1 MMOL/ML IV SOLN COMPARISON:  08/31/2022 CT head FINDINGS: Brain: Redemonstrated intraparenchymal hemorrhage centered in the left thalamus, which measures 1.7 x 3.3 x 2.7 cm (AP x TR x CC) (series 16, image 32 and series 11, image 16), previously 1.5 x 3.2 x 2.8 cm, overall unchanged when accounting for differences in technique. Surrounding T2 hyperintense signal with some enhancement, likely edema. Unchanged 7 mm of  left-to-right midline shift. Redemonstrated extension into the left greater than right lateral ventricle, third ventricle, and fourth ventricle. Unchanged size and configuration the ventricles when accounting for differences in technique, status post right frontal approach drain placement. 7 mm focus of restricted diffusion with ADC correlate in the periventricular white matter adjacent to right ventricle atrium (series 5, image 83), concerning for acute infarct. No abnormal parenchymal or meningeal enhancement. The basilar cisterns are patent. Additional foci of hemosiderin deposition in the right temporal lobe (series 14, images 30, 34, and 36), in the posterior right hippocampus (series 14, image 26), and in the superior cerebellum (series 14, image 28). Vascular: Normal arterial flow voids. Normal arterial and venous enhancement. Skull and upper cervical spine: Right frontal burr hole. Otherwise normal marrow signal. Sinuses/Orbits: Diffuse opacification of the paranasal sinuses, likely related to intubation. No acute finding in the orbits. Other: Fluid in the bilateral mastoid air cells, also likely related to intubation. IMPRESSION: 1. 7 mm focus of restricted diffusion in the periventricular white matter adjacent to the right ventricle atrium, concerning for acute infarct. 2. Redemonstrated intraparenchymal hemorrhage centered in the left thalamus, with extension into the left greater than right lateral ventricle, third ventricle, and fourth ventricle. Unchanged 7 mm of left-to-right midline shift. 3. Unchanged size and configuration of the ventricles when accounting for differences in technique, status post right frontal approach drain placement. These results will be called to the ordering clinician or representative by the Radiologist Assistant, and communication documented in the PACS or [employer]. Electronically Signed  By: Wiliam Ke M.D.   On: 09/03/2022 03:21   Overnight EEG with  video  Result Date: 09/02/2022 Charlsie Quest, MD     09/02/2022  2:05 PM Patient Name: Jimmy Marshall MRN: [id number] Epilepsy Attending: Charlsie Quest Referring Physician/Provider: Milon Dikes, MD Duration: 09/01/2022 4098 to 09/02/2022 0849  Patient history: 67yo M with left thalamic ICH now with seizure like activity. EEG to evaluate for seizure.  Level of alertness: lethargic/sedated  AEDs during EEG study: Propofol  Technical aspects: This EEG study was done with scalp electrodes positioned according to the 10-20 International system of electrode placement. Electrical activity was reviewed with band pass filter of 1-70Hz , sensitivity of 7 uV/mm, display speed of 60mm/sec with a 60Hz  notched filter applied as appropriate. EEG data were recorded continuously and digitally stored.  Video monitoring was available and reviewed as appropriate.  Description: No posterior dominant rhythm was seen. Sleep was characterized by sleep spindles 912-14hz ) maximal frontocentral region. EEG showed continuous generalized predominantly 5 -7Hz  theta slowing admixed with intermittent 2-3hz  delta slowing.  Hyperventilation and photic stimulation were not performed.   Intermittently throughout the study, patient was noted to have shoulder jerking every few seconds. Concomitant eeg didn't show any eeg change to suggest seizure.   ABNORMALITY - Continuous slow, generalized  IMPRESSION: This study is suggestive of severe diffuse encephalopathy, nonspecific etiology but could be related to sedation. No seizures or epileptiform discharges were seen throughout the recording. Intermittently throughout the study, patient was noted to have shoulder jerking every few seconds  lasting for about an hour without concomitant eeg change. These events were NON-epileptic.  Charlsie Quest    DG Chest Port 1 View  Result Date: 09/01/2022 CLINICAL DATA:  Ventilator support EXAM: PORTABLE CHEST 1 VIEW COMPARISON:  08/31/2022 FINDINGS:  Endotracheal tube 3 cm above the carina. Orogastric or nasogastric tube enters the abdomen. Slightly improved aeration in both lower lobes. Upper lungs remain clear. IMPRESSION: Slight improvement in aeration in both lower lobes. Electronically Signed   By: Paulina Fusi M.D.   On: 09/01/2022 14:02    Assessment/Plan: Raise EVD today to 15 H2O. Will see how he tolerates this. ICP looks like he's running around 10-11.    LOS: 8 days    Tiana Loft Usher Hedberg 09/03/2022, 8:05 AM

## 2022-09-03 NOTE — Progress Notes (Signed)
NAME:  Jimmy Marshall, MRN:  [id number], DOB:  11-11-1955, LOS: 8 ADMISSION DATE:  08/26/2022 CONSULTATION DATE:  08/26/2022 REFERRING MD:  Kommor - EDP CHIEF COMPLAINT:  AMS, ICH   History of Present Illness:  67 year old man who presented to [facility] ED 5/22 after being found down at a hotel.  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  Patient is intubated and sedated, therefore history is obtained from chart review.  Per EMS, patient is currently residing in a hotel and was found down by staff who went to collect rent from patient.  Found unresponsive, RR 4 and Narcan 1mg  (IN) and 3mg  IV given with no response.  Intubated on arrival to ED. CXR notable for left greater than right atelectasis.  Per EDP, BP very labile; hypertensive on admission then dropping BP to SBP 70s requiring Neo pushes.  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  Neurosurgery was consulted.  CTA Chest/A/P negative for aortic dissection, overall unchanged enlargement of distal aortic arch/ascending thoracic aorta.  PCCM consulted for ICU admission.  Pertinent Medical History:   Past Medical History:  Diagnosis Date   GERD (gastroesophageal reflux disease)    Glaucoma    Hypertension    Schizoaffective disorder, depressive type (HCC)    "dx'd in the 1990's"   Schizophrenia, schizo-affective (HCC)   Cocaine abuse   Significant Hospital Events: Including procedures, antibiotic start and stop dates in addition to other pertinent events   5/22 -presented via EMS after being found down at hotel.  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 Neuro/Stroke consulted. Neurosurgery consulted. EVD placement. 5/24 3%NS held for elevated Na and Cl, CT imaging shows non-worsening  hematoma  5/27 mucous plugging, bradycardic> FOB, intermittent myoclonic jerking noted 5/28 on cEEG for concern of seizures, tmax 102.4/ WBC 8.7, +RUE DVT 5/29  cEEG neg for seizures, d/c, sedation held for ~8hrs, no change in neuro, resumed for severe coughing and vent dyssynchrony, ongoing intermittent fevers, WBC normal, trach asp +kleb pna, few e.coli  Interim History / Subjective:  Trach asp w/ few klebsiella pna, e. coli Intermittent coughing spells and vent dyssynchrony - has been on SIMV +PS, PSV, PRVC.  Possible cuff leak per RT EVD raised to 15 by NSGY Back on Cleviprex MRI overnight  Objective:  Blood pressure (!) 146/77, pulse 88, temperature (!) 100.4 F (38 C), resp. rate 19, height 5\' 11"  (1.803 m), weight 99.2 kg, SpO2 93 %.    Vent Mode: PRVC FiO2 (%):  [40 %-50 %] 40 % Set Rate:  [18 bmp] 18 bmp Vt Set:  [600 mL] 600 mL PEEP:  [5 cmH20-8 cmH20] 6 cmH20 Pressure Support:  [5 cmH20-8 cmH20] 8 cmH20 Plateau Pressure:  [14 cmH20-18 cmH20] 18 cmH20   Intake/Output Summary (Last 24 hours) at 09/03/2022 1058 Last data filed at 09/03/2022 1000 Gross per 24 hour  Intake 4559.14 ml  Output 1872 ml  Net 2687.14 ml   Filed Weights   08/29/22 0426 09/02/22 0500 09/03/22 0705  Weight: 93.9 kg 96.7 kg 99.2 kg   Physical Examination: Fent 75, propofol 25 General:  critically ill older adult male in NAD HEENT: MM pink/moist, poor dentition with loose lower central incisor, ETT/ OGT, pupils remain 2-3/ nr, +corneals, R EVD remains bloody, at 15 Neuro: does not open eyes, no response to noxious stimuli,  strong cough, +gag CV: rr, NSR PULM:  non labored, currently synchronous on PSV, coarse diminished in bases, minimal secretions, intermittent audible airleak> ETT at 20 at lip> advanced to 25 and ETT holder changed as appeared ETT sliding with strong cough with repeat CXR showing good placement of ETT.  Possible cuff leak vs high positioning of ETT, seems resolved  GI: soft,  bs+, NT, purwick, rectal pouch- leaking Extremities: warm/dry, BUE R> L edema, mild generalized, no LE pitting, kerlix dressing to R AC Skin: no rashes  Labs/imaging reviewed > repeat triglyceride level 193, Na 146, K 3.7,  UOP 1.2L/ 24hrs Net +7.7L EVD 173> 181> 178 > 194> 195 ml/ 24hr  Trach asp> few kleb pna, e. 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 cEEG neg for seizures, d/c'd 5/29 Hiccups P:  - EVD per NSGY, raised to 15 (09/03/22) - Neuro/Stroke following - SBP goal 120-160, see below - further imaging per Neuro/ NSGY - Keppra/ AED's per Neuro - continue baclofen for hiccups - cont Neuroprotective measures: HOB > 30 degrees, normoglycemia, normothermia, electrolytes WNL - Poor prognosis for functional recovery per NSGY and neurology.  Ongoing GOC.    HTN emergency - cont to wean cleviprex for strict SBP goals 120-160 - increase losartan 100> 150mg  daily, adding hydralazine, continue norvasc 10, spiro 25mg , and coreg 25mg  BID  Acute hypoxemic respiratory failure Aspiration pneumonia Klebsiella/ e. Coli  PNA Mucous plugging  - Continue full vent support with daily weaning trials, mental status remains barrier to extubation - Wean FiO2 for O2 sat > 90% - VAP bundle/ PPI - continue unasyn for 5x/ 7 day course.   - cont guaifenesin, CPT as tolerated.  - PAD protocol > propofol/ fentanyl given severe coughing/ vent dyssynchrony - ABG checked> reassuring   RUE DVT - continue to elevate, supportive care  - lovenox ppx as below, no further AC with ICH  Thoracic aortic intramural hematoma and descending thoracic aortic aneurysm HTN Noted on scan 08/2021 4.6  cm. - BP management as above   Schizoaffective disorder/schizophrenia - holding zyprexa   GERD - PPI  At risk for hyperglycemia - SSI  Fever - WBC remains reassuring/ normal.  Likely 2/2 DVT and ICH - being adequately covered as above for HCAP  Best Practice: (right click and "Reselect all SmartList Selections" daily)   Diet/type: tubefeeds DVT prophylaxis: LMWH GI prophylaxis: PPI Lines: Central line-picc  Foley:  N/A- purwick Code Status:  full code Last date of multidisciplinary goals of care discussion   - pt has daughter and 4-5 son's per his sister.  Daughter, Melton Alar is the family decision maker and information only to family with the password.  Pending update to daughter 5/30.  Tentative plans to readdress GOC/ ?transition to comfort on Saturday 6/1.  Labs:  CBC: Recent Labs  Lab 08/30/22 0541 08/31/22 0404 09/01/22 0433 09/02/22 0509 09/03/22 0639 09/03/22 0859  WBC 7.6 7.7 8.7 7.6 7.2  --   HGB 11.3* 11.1* 10.6* 10.6* 9.6* 10.5*  HCT 36.0* 33.9* 33.8* 32.6* 30.9* 31.0*  MCV 87.2 86.5 86.9 88.6 87.0  --   PLT 191 180 178 186 168  --    Basic  Metabolic Panel: Recent Labs  Lab 08/27/22 1441 08/27/22 2021 08/28/22 0155 08/28/22 0808 08/28/22 1644 08/28/22 1935 08/30/22 0541 08/30/22 0813 08/31/22 0404 08/31/22 0730 08/31/22 2026 09/01/22 0433 09/01/22 1017 09/02/22 0509 09/03/22 0639 09/03/22 0859  NA  --    < > 156*   < >  --    < > 156*   < > 153*   < > 151* 150*  --  143 146* 149*  K  --   --  3.6  --   --    < > 3.1*  --  3.3*  --   --  3.6  --  4.1 3.7 3.8  CL  --   --  130*  --   --    < > 123*  --  121*  --   --  119*  --  113* 115*  --   CO2  --   --  20*  --   --    < > 22  --  23  --   --  23  --  19* 22  --   GLUCOSE  --   --  104*  --   --    < > 126*  --  147*  --   --  177*  --  179* 243*  --   BUN  --   --  16  --   --    < > 25*  --  35*  --   --  29*  --  27* 30*  --   CREATININE  --   --  0.96  --   --    < > 1.11  --  1.27*   --   --  1.06  --  1.03 1.07  --   CALCIUM  --   --  8.0*  --   --    < > 9.1  --  8.7*  --   --  8.0*  --  8.2* 8.2*  --   MG 2.2  --  2.3  --  2.7*  --   --   --   --   --   --   --  2.7* 3.0*  --   --   PHOS 2.5  --  2.1*  --  2.6  --  3.4  --   --   --   --   --   --   --   --   --    < > = values in this interval not displayed.   GFR: Estimated Creatinine Clearance: 83.8 mL/min (by C-G formula based on SCr of 1.07 mg/dL). Recent Labs  Lab 08/31/22 0404 09/01/22 0433 09/02/22 0509 09/03/22 0639  WBC 7.7 8.7 7.6 7.2    Liver Function Tests: No results for input(s): "AST", "ALT", "ALKPHOS", "BILITOT", "PROT", "ALBUMIN" in the last 168 hours.  No results for input(s): "LIPASE", "AMYLASE" in the last 168 hours. No results for input(s): "AMMONIA" in the last 168 hours.  ABG:    Component Value Date/Time   PHART 7.389 09/03/2022 0859   PCO2ART 37.6 09/03/2022 0859   PO2ART 105 09/03/2022 0859   HCO3 22.5 09/03/2022 0859   TCO2 24 09/03/2022 0859   ACIDBASEDEF 2.0 09/03/2022 0859   O2SAT 98 09/03/2022 0859    Coagulation Profile: No results for input(s): "INR", "PROTIME" in the last 168 hours.  Cardiac Enzymes: No results for input(s): "CKTOTAL", "CKMB", "CKMBINDEX", "TROPONINI" in the last  168 hours.  HbA1C: Hgb A1c MFr Bld  Date/Time Value Ref Range Status  08/27/2022 02:08 AM 6.0 (H) 4.8 - 5.6 % Final    Comment:    (NOTE)         Prediabetes: 5.7 - 6.4         Diabetes: >6.4         Glycemic control for adults with diabetes: <7.0   08/26/2022 04:06 PM 5.6 4.8 - 5.6 % Final    Comment:    (NOTE) Pre diabetes:          5.7%-6.4%  Diabetes:              >6.4%  Glycemic control for   <7.0% adults with diabetes    CBG: Recent Labs  Lab 09/02/22 1525 09/02/22 1924 09/02/22 2320 09/03/22 0316 09/03/22 0822  GLUCAP 201* 133* 151* 199* 156*    Critical care time:    The patient is critically ill with multiple organ system failure and requires high  complexity decision making for assessment and support, frequent evaluation and titration of therapies, advanced monitoring, review of radiographic studies and interpretation of complex data.   Critical Care Time devoted to patient care services, exclusive of separately billable procedures, described in this note is 36 minutes.   Posey Boyer, MSN, AG-ACNP-BC [facility] 09/03/2022, 10:58 AM  See Amion for pager If no response to pager, please call PCCM consult pager After 7:00 pm call Elink      From 7A-7P if no response, please call [phone] After hours, please call ELink [phone]

## 2022-09-03 NOTE — Progress Notes (Signed)
RT transported Pt from 4N17 to CT and back without any complications. RN x 3 at bedside.

## 2022-09-03 NOTE — Progress Notes (Signed)
RT and RN transported patient to MRI and back to 4N17 without event. Patient suctioned prior to and after transport per VAP protocol.

## 2022-09-04 DIAGNOSIS — I615 Nontraumatic intracerebral hemorrhage, intraventricular: Secondary | ICD-10-CM | POA: Diagnosis not present

## 2022-09-04 DIAGNOSIS — G911 Obstructive hydrocephalus: Secondary | ICD-10-CM | POA: Diagnosis not present

## 2022-09-04 DIAGNOSIS — J9601 Acute respiratory failure with hypoxia: Secondary | ICD-10-CM | POA: Diagnosis not present

## 2022-09-04 DIAGNOSIS — I610 Nontraumatic intracerebral hemorrhage in hemisphere, subcortical: Secondary | ICD-10-CM | POA: Diagnosis not present

## 2022-09-04 LAB — GLUCOSE, CAPILLARY
Glucose-Capillary: 248 mg/dL — ABNORMAL HIGH (ref 70–99)
Glucose-Capillary: 193 mg/dL — ABNORMAL HIGH (ref 70–99)
Glucose-Capillary: 204 mg/dL — ABNORMAL HIGH (ref 70–99)
Glucose-Capillary: 198 mg/dL — ABNORMAL HIGH (ref 70–99)
Glucose-Capillary: 257 mg/dL — ABNORMAL HIGH (ref 70–99)
Glucose-Capillary: 214 mg/dL — ABNORMAL HIGH (ref 70–99)

## 2022-09-04 LAB — CBC
HCT: 31.5 % — ABNORMAL LOW (ref 39.0–52.0)
Hemoglobin: 9.9 g/dL — ABNORMAL LOW (ref 13.0–17.0)
MCH: 27.2 pg (ref 26.0–34.0)
MCHC: 31.4 g/dL (ref 30.0–36.0)
MCV: 86.5 fL (ref 80.0–100.0)
Platelets: 205 10*3/uL (ref 150–400)
RBC: 3.64 MIL/uL — ABNORMAL LOW (ref 4.22–5.81)
RDW: 18.3 % — ABNORMAL HIGH (ref 11.5–15.5)
WBC: 7.5 10*3/uL (ref 4.0–10.5)
nRBC: 0 % (ref 0.0–0.2)

## 2022-09-04 LAB — BASIC METABOLIC PANEL
Anion gap: 11 (ref 5–15)
BUN: 32 mg/dL — ABNORMAL HIGH (ref 8–23)
CO2: 23 mmol/L (ref 22–32)
Calcium: 8.4 mg/dL — ABNORMAL LOW (ref 8.9–10.3)
Chloride: 111 mmol/L (ref 98–111)
Creatinine, Ser: 1.11 mg/dL (ref 0.61–1.24)
GFR, Estimated: >60 mL/min (ref >60)
Glucose, Bld: 266 mg/dL — ABNORMAL HIGH (ref 70–99)
Potassium: 3.5 mmol/L (ref 3.5–5.1)
Sodium: 145 mmol/L (ref 135–145)

## 2022-09-04 MED ORDER — INSULIN ASPART 100 UNIT/ML IJ SOLN
0.0000 [IU] | INTRAMUSCULAR | Status: DC
  Administered 2022-09-04: 7 [IU] via SUBCUTANEOUS
  Administered 2022-09-04: 11 [IU] via SUBCUTANEOUS
  Administered 2022-09-04: 7 [IU] via SUBCUTANEOUS
  Administered 2022-09-04 – 2022-09-05 (×4): 4 [IU] via SUBCUTANEOUS

## 2022-09-04 MED ORDER — POTASSIUM CHLORIDE 20 MEQ PO PACK
40.0000 meq | PACK | Freq: Two times a day (BID) | ORAL | Status: AC
  Administered 2022-09-04 (×2): 40 meq
  Filled 2022-09-04 (×2): qty 2

## 2022-09-04 MED ORDER — HYDRALAZINE HCL 50 MG PO TABS
50.0000 mg | ORAL_TABLET | Freq: Three times a day (TID) | ORAL | Status: DC
  Administered 2022-09-04 – 2022-09-05 (×3): 50 mg
  Filled 2022-09-04 (×3): qty 1

## 2022-09-04 MED ORDER — BANATROL TF EN LIQD
60.0000 mL | Freq: Three times a day (TID) | ENTERAL | Status: DC
  Administered 2022-09-04 – 2022-09-05 (×4): 60 mL
  Filled 2022-09-04 (×4): qty 60

## 2022-09-04 MED ORDER — FUROSEMIDE 10 MG/ML IJ SOLN
80.0000 mg | Freq: Two times a day (BID) | INTRAMUSCULAR | Status: AC
  Administered 2022-09-04 (×2): 80 mg via INTRAVENOUS
  Filled 2022-09-04 (×2): qty 8

## 2022-09-04 MED ORDER — POTASSIUM CHLORIDE 20 MEQ PO PACK
40.0000 meq | PACK | Freq: Once | ORAL | Status: AC
  Administered 2022-09-04: 40 meq
  Filled 2022-09-04: qty 2

## 2022-09-04 NOTE — Progress Notes (Signed)
STROKE TEAM PROGRESS NOTE   INTERVAL HISTORY Sister is at the bedside. Her daughter which is pt niece is also at the bedside. Pt still intubated, on sedation, not responsive. However, when he is peeing through condom catheter, he was able to spontaneous open eyes, however, not interactive, not tracking or blinking to visual threat, vegetative like reaction.     Vitals:   09/04/22 2115 09/04/22 2130 09/04/22 2145 09/04/22 2200  BP: 116/70 125/75 128/75 129/78  Pulse: 84 83 81 80  Resp: 18 18 18 18   Temp: 99.1 F (37.3 C) 99.7 F (37.6 C) 98.8 F (37.1 C) 98.8 F (37.1 C)  TempSrc:      SpO2: 96% 96% 96% 97%  Weight:      Height:       CBC:  Recent Labs  Lab 09/03/22 0639 09/03/22 0859 09/04/22 0542  WBC 7.2  --  7.5  HGB 9.6* 10.5* 9.9*  HCT 30.9* 31.0* 31.5*  MCV 87.0  --  86.5  PLT 168  --  205   Basic Metabolic Panel:  Recent Labs  Lab 08/30/22 0541 08/30/22 0813 09/01/22 1017 09/02/22 0509 09/03/22 0639 09/03/22 0859 09/04/22 0542  NA 156*   < >  --  143 146* 149* 145  K 3.1*   < >  --  4.1 3.7 3.8 3.5  CL 123*   < >  --  113* 115*  --  111  CO2 22   < >  --  19* 22  --  23  GLUCOSE 126*   < >  --  179* 243*  --  266*  BUN 25*   < >  --  27* 30*  --  32*  CREATININE 1.11   < >  --  1.03 1.07  --  1.11  CALCIUM 9.1   < >  --  8.2* 8.2*  --  8.4*  MG  --   --  2.7* 3.0*  --   --   --   PHOS 3.4  --   --   --   --   --   --    < > = values in this interval not displayed.   Lipid Panel:  Recent Labs  Lab 09/02/22 0832  TRIG 193*   HgbA1c:  No results for input(s): "HGBA1C" in the last 168 hours.  Urine Drug Screen:  Recent Labs  Lab 08/29/22 1018  LABOPIA NONE DETECTED  COCAINSCRNUR NONE DETECTED  LABBENZ NONE DETECTED  AMPHETMU NONE DETECTED  THCU POSITIVE*  LABBARB NONE DETECTED    Alcohol Level No results for input(s): "ETH" in the last 168 hours.  IMAGING past 24 hours No results found.  PHYSICAL EXAM  Temp:  [98.8 F (37.1  C)-101.3 F (38.5 C)] 98.8 F (37.1 C) (05/31 2200) Pulse Rate:  [80-96] 80 (05/31 2200) Resp:  [11-28] 18 (05/31 2200) BP: (107-174)/(63-87) 129/78 (05/31 2200) SpO2:  [94 %-99 %] 97 % (05/31 2200) FiO2 (%):  [40 %] 40 % (05/31 2000) Weight:  [96.8 kg] 96.8 kg (05/31 0500)  General - Well nourished, well developed, intubated on sedation.  Ophthalmologic - fundi not visualized due to noncooperation.  Cardiovascular - Regular rhythm and rate  Neuro - intubated on sedation, eyes closed, not open on voice, not following commands. With forced eye opening, b/l eyes mid position, not blinking to visual threat, doll's eyes sluggish, not tracking, b/l pupil 2mm, sluggish to light. Corneal reflex weakly present bilaterally, gag and cough  present. Breathing over the vent.  Facial symmetry not able to test due to ET tube.  Tongue protrusion not cooperative. On pain stimulation, slight withdraw on the LUE and LLE. Sensation, coordination and gait not tested.    ASSESSMENT/PLAN Jimmy Marshall is a 67 y.o. 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 S/p EVD, Ventricular system may be slightly smaller but otherwise no significant improvement. Mild new rightward midline shift, 2-3 mm CTA head & neck no AVM or  aneurysm CT repeat showed stable left thalamic ICH with large IVH.  Hydrocephalus has improved after EVD placement. MRI stable left thalamic ICH with extensive IVH and MLS. Small right ventricle atrium WM infarct.  2D Echo LVEF 50-55% EEG no seizure LDL 101 HgbA1c 5.6  UDS - positive for THC EEG suggestive of severe diffuse encephalopathy of nonspecific etiology though likely related to sedation.  No seizures or epileptiform discharges seen throughout recording. VTE prophylaxis - heparin subq No antithrombotic prior to admission, now on No antithrombotic due to IPH Completed keppra for 7 days for seizure prevention Therapy recommendations:  pending Disposition:  GOC discussion with daughter, she would not want trach or PEG if not able to wean off vent, tentative plan of one way extubation on Saturday  Seizure like activity 5/28 recurrent episode of whole body jerking Stat EEG no seizure Off sedation today LTM EEG no seizure, d/c'ed  Hydrocephalus s/p EVD placement Cerebral edema On HTS 3% s/p 250 mL bolus, 75 cc/h gtt -> off Na 148 > 152->156->153 ->152->150->143->149->145 Off free water CT repeat showed hydrocephalus has improved MRI stable left thalamic ICH with extensive IVH and MLS.  Allow Na trending down slowly  Respiratory failure Fever ?  Aspiration pneumonia CCM on board for vent management Tmax 102.1->100.9->101.5->101.3->100.8 WBC 7.7->8.7->7.6->7.2->7.5 On Unasyn  Hypertensive emergency Home meds:  losartan, diltiazem Stable now on cleviprex gtt On losartan 100, amlodipine 10, and spironolactone 25 PRN labetalol and hydralazine for hypertension  Blood pressure goal systolic < 160 Long-term BP goal normotensive  Hyperlipidemia Home meds: None LDL 101, goal < 70 Consider adding statin at discharge if aggressive care  Other Stroke Risk Factors Substance abuse - UDS:  THC POSITIVE. Patient will be advised to stop using due to stroke risk.  Other Active  Problems Schizoaffective disorder, depressive type On Zyprexa Glaucoma Aortic aneurysm   Hospital day # 9  This patient is critically ill due to ICH, IVH and  hydrocephalus, respiratory failure, hypertensive emergency and at significant risk of neurological worsening, death form hematoma expansion, brain herniation, obstructive hydrocephalus, status epilepticus. This patient's care requires constant monitoring of vital signs, hemodynamics, respiratory and cardiac monitoring, review of multiple databases, neurological assessment, discussion with family, other specialists and medical decision making of high complexity. I spent 35 minutes of neurocritical care time in the care of this patient.    Marvel Plan, MD PhD Stroke Neurology 09/04/2022 10:39 PM  To contact Stroke Continuity provider, please refer to [email or web address]. After hours, contact General Neurology

## 2022-09-04 NOTE — Progress Notes (Signed)
NAME:  Jimmy Marshall, MRN:  [id number], DOB:  January 21, 1956, LOS: 8 ADMISSION DATE:  08/08/2022 CONSULTATION DATE:  08/14/2022 REFERRING MD:  Kommor - EDP CHIEF COMPLAINT:  AMS, ICH   History of Present Illness:  67 year old man who presented to [facility] ED 5/22 after being found down at a hotel.  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  Patient is intubated and sedated, therefore history is obtained from chart review.  Per EMS, patient is currently residing in a hotel and was found down by staff who went to collect rent from patient.  Found unresponsive, RR 4 and Narcan 1mg  (IN) and 3mg  IV given with no response.  Intubated on arrival to ED. CXR notable for left greater than right atelectasis.  Per EDP, BP very labile; hypertensive on admission then dropping BP to SBP 70s requiring Neo pushes.  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  Neurosurgery was consulted.  CTA Chest/A/P negative for aortic dissection, overall unchanged enlargement of distal aortic arch/ascending thoracic aorta.  PCCM consulted for ICU admission.  Pertinent Medical History:   Past Medical History:  Diagnosis Date   GERD (gastroesophageal reflux disease)    Glaucoma    Hypertension    Schizoaffective disorder, depressive type (HCC)    "dx'd in the 1990's"   Schizophrenia, schizo-affective (HCC)   Cocaine abuse   Significant Hospital Events: Including procedures, antibiotic start and stop dates in addition to other pertinent events   5/22 -presented via EMS after being found down at hotel.  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 Neuro/Stroke consulted. Neurosurgery consulted. EVD placement. 5/24 3%NS held for elevated Na and Cl, CT imaging shows non-worsening  hematoma  5/27 mucous plugging, bradycardic> FOB, intermittent myoclonic jerking noted 5/28 on cEEG for concern of seizures, tmax 102.4/ WBC 8.7, +RUE DVT 5/29  cEEG neg for seizures, d/c, sedation held for ~8hrs, no change in neuro, resumed for severe coughing and vent dyssynchrony, ongoing intermittent fevers, WBC normal, trach asp +kleb pna, few e.coli 5/30 EVD raised to 15 by NSGY; Back on Cleviprex MRI overnight 1. 7 mm focus of restricted diffusion in the periventricular white matter adjacent to the right ventricle atrium, concerning for acute infarct.2. Redemonstrated intraparenchymal hemorrhage centered in the left thalamus, with extension into the left greater than right lateral ventricle, third ventricle, and fourth ventricle. Unchanged 7 mm of left-to-right midline shift.3. Unchanged size and configuration of the ventricles when accounting for differences in technique, status post right frontal approach drain placement.   Interim History / Subjective:  No change  Objective:  Blood pressure (!) 146/77, pulse 88, temperature (!) 100.4 F (38 C), resp. rate 19, height 5\' 11"  (1.803 m), weight 99.2 kg, SpO2 93 %.    Vent Mode: PRVC FiO2 (%):  [40 %-50 %] 40 % Set Rate:  [18 bmp] 18 bmp Vt Set:  [600 mL] 600 mL PEEP:  [5 cmH20-8 cmH20] 6 cmH20 Pressure Support:  [5 cmH20-8 cmH20] 8 cmH20 Plateau Pressure:  [14 cmH20-18 cmH20] 18 cmH20   Intake/Output Summary (Last 24 hours) at 09/03/2022 1058 Last data filed at 09/03/2022 1000 Gross per 24 hour  Intake 4559.14 ml  Output 1872 ml  Net 2687.14 ml   Filed Weights   08/29/22 0426 09/02/22 0500 09/03/22 0705  Weight: 93.9 kg 96.7 kg 99.2 kg  Physical Examination: General sedated on vent. GCS 3 for me HENT NCAT orally intubated Pulm course rhonchi  Card rrr Abd soft Ext warm  Neuro GCS 3/ I cannot get w/d. 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 cEEG neg for seizures, d/c'd 5/29 - Poor prognosis for functional recovery per NSGY and neurology.  Ongoing GOC->DNR  Hiccups Plan EVD per NSGY, raised to 15 (09/03/22) Neuro/Stroke following SBP goal 120-160, see below Keppra/ AED's per Neuro (7 days) continue baclofen for hiccups cont Neuroprotective measures: HOB > 30 degrees, normoglycemia, normothermia, electrolytes WNL .    HTN emergency Plan increased losartan to 150mg  daily, increased hydralazine, continue norvasc 10, spiro 25mg , and coreg 25mg  BID cont to wean cleviprex for strict SBP goals 120-160 *   Acute hypoxemic respiratory failure Aspiration pneumonia Klebsiella/ e. Coli  PNA Mucous plugging  Plan Cont full vent support Daily assessment for SBT VAP bundle PAD protocol ->still on prop and fent for cough and severe dyssynchrony  Cont Chest PT Cont guaifenesin  Day 6 of 7unasyn  RUE DVT Plan continue to elevate, supportive care  lovenox prophylaxis dosing, no further AC with ICH  Thoracic aortic intramural hematoma and descending thoracic aortic aneurysm HTN Noted on scan 08/2021 4.6 cm. Plan BP management as above   Schizoaffective disorder/schizophrenia Plan holding zyprexa   GERD Plan PPI  Hyperglycemia  Plan Change ssi to resistant scale  Fever  Likely 2/2 DVT and ICH; tmax 101.5 Plan Monitor    Best Practice: (right click and "Reselect all SmartList Selections" daily)   Diet/type: tubefeeds DVT prophylaxis: LMWH GI prophylaxis: PPI Lines: Central line-picc  Foley:  N/A- purwick Code Status:  full code Last date of multidisciplinary goals of care discussion   - pt has daughter and 4-5 son's per his sister.  Daughter, Melton Alar is the family decision maker and information only to family with the password.  Pending update to daughter 5/30.  Tentative plans to readdress GOC/ ?transition to comfort on Saturday 6/1.  My time 74 min  Simonne Martinet ACNP-BC [facility]monary/Critical Care Pager # [phone] OR # [phone] if no answer

## 2022-09-04 NOTE — Progress Notes (Signed)
Patient being made comfort care tomorrow. We will sign off at this point.

## 2022-09-05 DIAGNOSIS — I610 Nontraumatic intracerebral hemorrhage in hemisphere, subcortical: Secondary | ICD-10-CM | POA: Diagnosis not present

## 2022-09-05 LAB — BASIC METABOLIC PANEL
Anion gap: 9 (ref 5–15)
BUN: 35 mg/dL — ABNORMAL HIGH (ref 8–23)
CO2: 25 mmol/L (ref 22–32)
Calcium: 8.8 mg/dL — ABNORMAL LOW (ref 8.9–10.3)
Chloride: 112 mmol/L — ABNORMAL HIGH (ref 98–111)
Creatinine, Ser: 1.28 mg/dL — ABNORMAL HIGH (ref 0.61–1.24)
GFR, Estimated: >60 mL/min (ref >60)
Glucose, Bld: 201 mg/dL — ABNORMAL HIGH (ref 70–99)
Potassium: 4.1 mmol/L (ref 3.5–5.1)
Sodium: 146 mmol/L — ABNORMAL HIGH (ref 135–145)

## 2022-09-05 LAB — CBC
HCT: 30 % — ABNORMAL LOW (ref 39.0–52.0)
Hemoglobin: 9.8 g/dL — ABNORMAL LOW (ref 13.0–17.0)
MCH: 28.4 pg (ref 26.0–34.0)
MCHC: 32.7 g/dL (ref 30.0–36.0)
MCV: 87 fL (ref 80.0–100.0)
Platelets: 205 10*3/uL (ref 150–400)
RBC: 3.45 MIL/uL — ABNORMAL LOW (ref 4.22–5.81)
RDW: 18.2 % — ABNORMAL HIGH (ref 11.5–15.5)
WBC: 8.2 10*3/uL (ref 4.0–10.5)
nRBC: 0 % (ref 0.0–0.2)

## 2022-09-05 LAB — GLUCOSE, CAPILLARY
Glucose-Capillary: 199 mg/dL — ABNORMAL HIGH (ref 70–99)
Glucose-Capillary: 196 mg/dL — ABNORMAL HIGH (ref 70–99)

## 2022-09-05 LAB — TRIGLYCERIDES: Triglycerides: 116 mg/dL (ref <150)

## 2022-09-05 MED ORDER — POLYVINYL ALCOHOL 1.4 % OP SOLN: 1.0000 [drp] | Freq: Four times a day (QID) | OPHTHALMIC | Status: DC | PRN

## 2022-09-05 MED ORDER — GLYCOPYRROLATE 0.2 MG/ML IJ SOLN: 0.2000 mg | INTRAMUSCULAR | Status: DC | PRN

## 2022-09-05 MED ORDER — SODIUM CHLORIDE 0.9 % IV SOLN: INTRAVENOUS | Status: DC

## 2022-09-05 MED ORDER — ACETAMINOPHEN 325 MG PO TABS: 650.0000 mg | ORAL_TABLET | Freq: Four times a day (QID) | ORAL | Status: DC | PRN

## 2022-09-05 MED ORDER — MORPHINE 100MG IN NS 100ML (1MG/ML) PREMIX INFUSION
5.0000 mg/h | INTRAVENOUS | Status: DC
  Administered 2022-09-05: 5 mg/h via INTRAVENOUS
  Filled 2022-09-05: qty 100

## 2022-09-05 MED ORDER — GLYCOPYRROLATE 0.2 MG/ML IJ SOLN
0.2000 mg | INTRAMUSCULAR | Status: DC | PRN
  Administered 2022-09-05: 0.2 mg via INTRAVENOUS
  Filled 2022-09-05: qty 1

## 2022-09-05 MED ORDER — ACETAMINOPHEN 650 MG RE SUPP: 650.0000 mg | Freq: Four times a day (QID) | RECTAL | Status: DC | PRN

## 2022-09-05 MED ORDER — GLYCOPYRROLATE 1 MG PO TABS: 1.0000 mg | ORAL_TABLET | ORAL | Status: DC | PRN

## 2022-09-05 DEATH — deceased

## 2022-09-06 ENCOUNTER — Other Ambulatory Visit: Payer: Self-pay | Admitting: Cardiology

## 2022-09-06 DIAGNOSIS — I10 Essential (primary) hypertension: Secondary | ICD-10-CM

## 2022-09-15 ENCOUNTER — Ambulatory Visit: Payer: No Typology Code available for payment source | Admitting: Cardiology

## 2022-10-05 NOTE — Progress Notes (Signed)
STROKE TEAM PROGRESS NOTE   INTERVAL HISTORY  No family at bedside. RN at bedside.   Plan for one way extubation around 1pm.   Vitals:   09/17/2022 0700 09/22/2022 0725 09/13/2022 0800 09/09/2022 0900  BP: (!) 150/80  (!) 167/93 139/79  Pulse: 77  76 75  Resp: 18  18 18   Temp: 98.8 F (37.1 C)  99.7 F (37.6 C) 98.6 F (37 C)  TempSrc:      SpO2: 98% 98% 97% 96%  Weight:      Height:       CBC:  Recent Labs  Lab 09/04/22 0542 09/22/2022 0526  WBC 7.5 8.2  HGB 9.9* 9.8*  HCT 31.5* 30.0*  MCV 86.5 87.0  PLT 205 205    Basic Metabolic Panel:  Recent Labs  Lab 08/30/22 0541 08/30/22 0813 09/01/22 1017 09/02/22 0509 09/03/22 0639 09/04/22 0542 10/02/2022 0526  NA 156*   < >  --  143   < > 145 146*  K 3.1*   < >  --  4.1   < > 3.5 4.1  CL 123*   < >  --  113*   < > 111 112*  CO2 22   < >  --  19*   < > 23 25  GLUCOSE 126*   < >  --  179*   < > 266* 201*  BUN 25*   < >  --  27*   < > 32* 35*  CREATININE 1.11   < >  --  1.03   < > 1.11 1.28*  CALCIUM 9.1   < >  --  8.2*   < > 8.4* 8.8*  MG  --   --  2.7* 3.0*  --   --   --   PHOS 3.4  --   --   --   --   --   --    < > = values in this interval not displayed.    Lipid Panel:  Recent Labs  Lab 10/02/2022 0526  TRIG 116    HgbA1c:  No results for input(s): "HGBA1C" in the last 168 hours.  Urine Drug Screen:  Recent Labs  Lab 08/29/22 1018  LABOPIA NONE DETECTED  COCAINSCRNUR NONE DETECTED  LABBENZ NONE DETECTED  AMPHETMU NONE DETECTED  THCU POSITIVE*  LABBARB NONE DETECTED     Alcohol Level No results for input(s): "ETH" in the last 168 hours.  IMAGING past 24 hours No results found.  PHYSICAL EXAM  Temp:  [98.2 F (36.8 C)-100.8 F (38.2 C)] 98.6 F (37 C) (06/01 0900) Pulse Rate:  [70-89] 75 (06/01 0900) Resp:  [15-26] 18 (06/01 0900) BP: (107-167)/(63-93) 139/79 (06/01 0900) SpO2:  [94 %-98 %] 96 % (06/01 0900) FiO2 (%):  [40 %] 40 % (06/01 0725) Weight:  [96.7 kg] 96.7 kg (06/01  0500)  General - Well nourished, well developed, intubated on sedation with fentanyl and propofol.   Neuro - intubated on sedation, eyes closed, not open on voice, not following commands. Pupils 3mm and sluggish. eyes mid position, not blinking to visual threat, no oculocephalic, not tracking, No Corneal reflex.  gag and cough present. Breathing over the vent.  Facial symmetry not able to test due to ET tube.  Tongue protrusion not cooperative. On pain stimulation, slight withdraw on the LUE and LLE. Sensation, coordination and gait not tested.    ASSESSMENT/PLAN Jimmy Marshall is a 67 y.o. male with history of HTN, thoracic  aortic intramural hematoma and descending thoracic aortic aneurysm, schizoaffective disorder, GERD, and glaucoma presenting with unresponsiveness and in respiratory failure after being found unresponsive in a hotel room by hotel staff. 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 S/p EVD, Ventricular system may be slightly smaller but otherwise no significant improvement. Mild new rightward midline shift, 2-3 mm CTA head & neck no AVM or aneurysm CT repeat showed stable left thalamic ICH with large IVH.  Hydrocephalus has improved after EVD placement. MRI stable left thalamic ICH with extensive IVH and MLS. Small right ventricle atrium WM infarct.  2D Echo LVEF 50-55% EEG no seizure LDL 101 HgbA1c 5.6  UDS - positive for THC EEG suggestive of severe diffuse encephalopathy of nonspecific etiology though likely related to sedation.  No seizures or  epileptiform discharges seen throughout recording. VTE prophylaxis - heparin subq No antithrombotic prior to admission, now on No antithrombotic due to IPH Completed keppra for 7 days for seizure prevention Therapy recommendations:  pending Disposition:  plan of one way extubation on today around 1pm.  Seizure like activity 5/28 recurrent episode of whole body jerking Stat EEG no seizure Off sedation today LTM EEG no seizure, d/c'ed  Hydrocephalus s/p EVD placement Cerebral edema On HTS 3% s/p 250 mL bolus, 75 cc/h gtt -> off Na 148 > 152->156->153 ->152->150->143->149->145 Off free water CT repeat showed hydrocephalus has improved MRI stable left thalamic ICH with extensive IVH and MLS.  Allow Na trending down slowly  Respiratory failure Fever ?  Aspiration pneumonia CCM on board for vent management Tmax 102.1->100.9->101.5->101.3->100.8 WBC 7.7->8.7->7.6->7.2->7.5 On Unasyn  Hypertensive emergency Home meds:  losartan, diltiazem Stable now on cleviprex gtt On losartan 100, amlodipine 10, and spironolactone 25 PRN labetalol and hydralazine for hypertension  Blood pressure goal systolic < 160 Long-term BP goal normotensive  Hyperlipidemia Home meds: None LDL 101, goal < 70   Other Stroke Risk Factors Substance abuse - UDS:  THC POSITIVE. Patient will be advised to stop using due to stroke risk.  Other Active Problems Schizoaffective disorder, depressive type On Zyprexa Glaucoma Aortic aneurysm   Hospital day # 10  This patient is critically ill due to ICH, IVH and  hydrocephalus, respiratory failure, hypertensive emergency and at significant risk of neurological worsening, death form hematoma expansion, brain herniation, obstructive hydrocephalus, status epilepticus. This patient's care requires constant monitoring of vital signs, hemodynamics, respiratory and cardiac monitoring, review of multiple databases, neurological assessment, discussion with family,  other specialists and medical decision making of high complexity. I spent 35 minutes of neurocritical care time in the care of this patient.   Discussed with Dr. Denese Killings.    09/15/2022 10:02 AM   To contact Stroke Continuity provider, please refer to [email or web address]. After hours, contact General Neurology

## 2022-10-05 NOTE — Progress Notes (Signed)
Wasted 65 cc's of morphine in stericycle with Gertie Exon, RN as witness.

## 2022-10-05 NOTE — Discharge Summary (Signed)
DEATH SUMMARY   Patient Details  Name: Jimmy Marshall MRN: [id number] DOB: 02/17/1956  Admission/Discharge Information   Admit Date:  2022/09/03  Date of Death: Date of Death: 09-13-2022  Time of Death: Time of Death: 1310  Length of Stay: 2022/09/22  Referring Physician: Orvilla Cornwall, MD   Reason(s) for Hospitalization  Intracerebral hemorrhage  Diagnoses  Preliminary cause of death: intracerebral hemorrhage  Secondary Diagnoses (including complications and co-morbidities):  Principal Problem:   ICH (intracerebral hemorrhage) (HCC) Active Problems:   Acute respiratory failure with hypoxia (HCC)   Communicating hydrocephalus (HCC)   Compromised airway   Thalamic hemorrhage (HCC)   Mucus plugging of bronchi   Brief Hospital Course (including significant findings, care, treatment, and services provided and events leading to death)  Jimmy Marshall is a 67 y.o. year old male who presented to [facility] ED 09-03-22 after being found down at a hotel.  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   Patient is intubated and sedated, therefore history is obtained from chart review.  Per EMS, patient is currently residing in a hotel and was found down by staff who went to collect rent from patient.  Found unresponsive, RR 4 and Narcan 1mg  (IN) and 3mg  IV given with no response.  Intubated on arrival to ED. CXR notable for left greater than right atelectasis.  Per EDP, BP very labile; hypertensive on admission then dropping BP to SBP 70s requiring Neo pushes.   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  Neurosurgery was consulted.  CTA Chest/A/P negative for aortic dissection, overall unchanged enlargement of distal aortic arch/ascending thoracic aorta.  EVD placed and patient started on 3%  hypertonic saline. Cleviprex started to keep SBP 130-150. Need propofol and fentanyl to prevent coughing spells and maintain ventilator synchrony. Baclofen started for hiccups.   Mentation remained poor and prognosis deemed to be poor because of extent of hemorrhage. Both the patient's daughter Melina Modena) and sister agree that the patient would not want to live severely debilitated but they requested a longer period of observation to satisfy other family members before ultimately transitioning to comfort care and allowing for a natural death.    Pertinent Labs and Studies  Significant Diagnostic Studies CT HEAD WO CONTRAST ( )  Result Date: 09/03/2022 CLINICAL DATA:  Neuro deficit, acute, stroke suspected. EXAM: CT HEAD WITHOUT CONTRAST TECHNIQUE: Contiguous axial images were obtained from the base of the skull through the vertex without intravenous contrast. RADIATION DOSE REDUCTION: This exam was performed according to the departmental dose-optimization program which includes automated exposure control, adjustment of the mA and/or kV according to patient size and/or use of iterative reconstruction technique. COMPARISON:  Head CT 08/31/2022. FINDINGS: Brain: Unchanged appearance of left thalamic hematoma with intraventricular extension casting the ventricular system. Similar position of a right frontal approach ventriculostomy catheter terminating in the anterior body of the right lateral ventricle. Continued decrease in dilation of the lateral and third ventricles. Unchanged trace subarachnoid hemorrhage along the right superior temporal sulcus. No new hemorrhage or new loss of gray-white differentiation. Vascular: No hyperdense vessel or unexpected calcification. Skull: Right frontal burr hole. No calvarial fracture. Skull base is unremarkable. Sinuses/Orbits: Extensive opacification of the paranasal sinuses, nasal cavity and pharynx, most likely secondary to endotracheal intubation. Orbits are  unremarkable. Other: None. IMPRESSION: 1. Unchanged appearance of left thalamic hematoma with intraventricular extension casting  the ventricular system. Similar position of a right frontal approach ventriculostomy catheter with continued decrease in dilation of the lateral and third ventricles. 2. Unchanged trace subarachnoid hemorrhage along the right superior temporal sulcus. 3. No new hemorrhage or new loss of gray-white differentiation. Electronically Signed   By: Orvan Falconer M.D.   On: 09/03/2022 18:05   DG CHEST PORT 1 VIEW  Result Date: 09/03/2022 CLINICAL DATA:  History of intracranial hemorrhage, endotracheal tube in place EXAM: PORTABLE CHEST 1 VIEW COMPARISON:  Chest x-ray Sep 01, 2022 FINDINGS: Stable support apparatus. Unchanged enlarged mediastinal and cardiac contours. Low lung volumes with bronchovascular crowding. Unchanged bibasilar pulmonary opacities. Small bilateral pleural effusions. No large pneumothorax. No acute osseous abnormality. The visualized upper abdomen is unremarkable. IMPRESSION: No significant change from prior examination. Electronically Signed   By: Jacob Moores M.D.   On: 09/03/2022 09:41   MR BRAIN W WO CONTRAST  Result Date: 09/03/2022 CLINICAL DATA:  Hemorrhagic stroke EXAM: MRI HEAD WITHOUT AND WITH CONTRAST TECHNIQUE: Multiplanar, multiecho pulse sequences of the brain and surrounding structures were obtained without and with intravenous contrast. CONTRAST:  9.64mL GADAVIST GADOBUTROL 1 MMOL/ML IV SOLN COMPARISON:  08/31/2022 CT head FINDINGS: Brain: Redemonstrated intraparenchymal hemorrhage centered in the left thalamus, which measures 1.7 x 3.3 x 2.7 cm (AP x TR x CC) (series 16, image 32 and series 11, image 16), previously 1.5 x 3.2 x 2.8 cm, overall unchanged when accounting for differences in technique. Surrounding T2 hyperintense signal with some enhancement, likely edema. Unchanged 7 mm of left-to-right midline shift. Redemonstrated extension into  the left greater than right lateral ventricle, third ventricle, and fourth ventricle. Unchanged size and configuration the ventricles when accounting for differences in technique, status post right frontal approach drain placement. 7 mm focus of restricted diffusion with ADC correlate in the periventricular white matter adjacent to right ventricle atrium (series 5, image 83), concerning for acute infarct. No abnormal parenchymal or meningeal enhancement. The basilar cisterns are patent. Additional foci of hemosiderin deposition in the right temporal lobe (series 14, images 30, 34, and 36), in the posterior right hippocampus (series 14, image 26), and in the superior cerebellum (series 14, image 28). Vascular: Normal arterial flow voids. Normal arterial and venous enhancement. Skull and upper cervical spine: Right frontal burr hole. Otherwise normal marrow signal. Sinuses/Orbits: Diffuse opacification of the paranasal sinuses, likely related to intubation. No acute finding in the orbits. Other: Fluid in the bilateral mastoid air cells, also likely related to intubation. IMPRESSION: 1. 7 mm focus of restricted diffusion in the periventricular white matter adjacent to the right ventricle atrium, concerning for acute infarct. 2. Redemonstrated intraparenchymal hemorrhage centered in the left thalamus, with extension into the left greater than right lateral ventricle, third ventricle, and fourth ventricle. Unchanged 7 mm of left-to-right midline shift. 3. Unchanged size and configuration of the ventricles when accounting for differences in technique, status post right frontal approach drain placement. These results will be called to the ordering clinician or representative by the Radiologist Assistant, and communication documented in the PACS or [employer]. Electronically Signed   By: Wiliam Ke M.D.   On: 09/03/2022 03:21   Overnight EEG with video  Result Date: 09/02/2022 Charlsie Quest, MD      09/02/2022  2:05 PM Patient Name: Jimmy Marshall MRN: [id number] Epilepsy Attending: Charlsie Quest Referring Physician/Provider: Milon Dikes, MD Duration: 09/01/2022 4098 to 09/02/2022 0849  Patient history: 67yo M with left thalamic ICH now with seizure like  activity. EEG to evaluate for seizure.  Level of alertness: lethargic/sedated  AEDs during EEG study: Propofol  Technical aspects: This EEG study was done with scalp electrodes positioned according to the 10-20 International system of electrode placement. Electrical activity was reviewed with band pass filter of 1-70Hz , sensitivity of 7 uV/mm, display speed of 25mm/sec with a 60Hz  notched filter applied as appropriate. EEG data were recorded continuously and digitally stored.  Video monitoring was available and reviewed as appropriate.  Description: No posterior dominant rhythm was seen. Sleep was characterized by sleep spindles 912-14hz ) maximal frontocentral region. EEG showed continuous generalized predominantly 5 -7Hz  theta slowing admixed with intermittent 2-3hz  delta slowing.  Hyperventilation and photic stimulation were not performed.   Intermittently throughout the study, patient was noted to have shoulder jerking every few seconds. Concomitant eeg didn't show any eeg change to suggest seizure.   ABNORMALITY - Continuous slow, generalized  IMPRESSION: This study is suggestive of severe diffuse encephalopathy, nonspecific etiology but could be related to sedation. No seizures or epileptiform discharges were seen throughout the recording. Intermittently throughout the study, patient was noted to have shoulder jerking every few seconds  lasting for about an hour without concomitant eeg change. These events were NON-epileptic.  Charlsie Quest    DG Chest Port 1 View  Result Date: 09/01/2022 CLINICAL DATA:  Ventilator support EXAM: PORTABLE CHEST 1 VIEW COMPARISON:  08/31/2022 FINDINGS: Endotracheal tube 3 cm above the carina. Orogastric or  nasogastric tube enters the abdomen. Slightly improved aeration in both lower lobes. Upper lungs remain clear. IMPRESSION: Slight improvement in aeration in both lower lobes. Electronically Signed   By: Paulina Fusi M.D.   On: 09/01/2022 14:02   EEG adult  Result Date: 09/01/2022 Charlsie Quest, MD     09/01/2022  3:21 AM Patient Name: Jimmy Marshall MRN: [id number] Epilepsy Attending: Charlsie Quest Referring Physician/Provider: Milon Dikes, MD Date: 09/01/2022 Duration: 23.05 mins Patient history: 67yo M with left thalamic ICH now with seizure like activity. EEG to evaluate for seizure. Level of alertness: comatose AEDs during EEG study: LEV, Propofol Technical aspects: This EEG study was done with scalp electrodes positioned according to the 10-20 International system of electrode placement. Electrical activity was reviewed with band pass filter of 1-70Hz , sensitivity of 7 uV/mm, display speed of 63mm/sec with a 60Hz  notched filter applied as appropriate. EEG data were recorded continuously and digitally stored.  Video monitoring was available and reviewed as appropriate. Description: EEG showed continuous generalized 3 to 6 Hz theta-delta slowing admixed with brief 1-3 seconds of generalized attenuation. Hyperventilation and photic stimulation were not performed.   ABNORMALITY - Continuous slow, generalized IMPRESSION: This study is suggestive of severe diffuse encephalopathy, nonspecific etiology but could be related to sedation. No seizures or epileptiform discharges were seen throughout the recording. Charlsie Quest   DG Abd 1 View  Result Date: 08/31/2022 CLINICAL DATA:  Encounter to confirm orogastric tube placement. EXAM: ABDOMEN - 1 VIEW COMPARISON:  08/28/2022 FINDINGS: Tip and side port of the enteric tube below the diaphragm in the stomach. Nonobstructive upper abdominal bowel gas pattern. IMPRESSION: Tip and side port of the enteric tube below the diaphragm in the stomach.  Electronically Signed   By: Narda Rutherford M.D.   On: 08/31/2022 18:08   DG CHEST PORT 1 VIEW  Result Date: 08/31/2022 CLINICAL DATA:  Endotracheal tube present. EXAM: PORTABLE CHEST 1 VIEW COMPARISON:  Radiograph earlier today FINDINGS: Tip of the endotracheal tube 3.5 cm from the  carina. Enteric tube below the diaphragm. Left central line remains in position. Mildly lower lung volumes. Unchanged bibasilar opacities, left greater than right, with air bronchograms on the left. There may be a left pleural effusion. Stable heart size and mediastinal contours. No pneumothorax. IMPRESSION: 1. Endotracheal tube tip 3.5 cm from the carina. Enteric tube below the diaphragm. 2. Unchanged bibasilar opacities, left greater than right, with air bronchograms on the left, atelectasis/collapse versus pneumonia. Possible left pleural effusion. Electronically Signed   By: Narda Rutherford M.D.   On: 08/31/2022 18:08   VAS [place] UPPER EXTREMITY VENOUS DUPLEX  Result Date: 08/31/2022 UPPER VENOUS STUDY  Patient Name:  Jimmy Marshall  Date of Exam:   08/31/2022 Medical Rec #: [id number]        Accession #:    [id number] Date of Birth: [birth year]       Patient Gender: M Patient Age:   43 years Exam Location:  [facility] Procedure:      VAS [place] UPPER EXTREMITY VENOUS DUPLEX Referring Phys: Felisa Bonier --------------------------------------------------------------------------------  Indications: Right arm swelling, erythema Comparison Study: No prior studies. Performing Technologist: Jean Rosenthal RDMS, RVT  Examination Guidelines: A complete evaluation includes B-mode imaging, spectral Doppler, color Doppler, and power Doppler as needed of all accessible portions of each vessel. Bilateral testing is considered an integral part of a complete examination. Limited examinations for reoccurring indications may be performed as noted.  Right Findings: +----------+------------+---------+-----------+----------+-------+ RIGHT      CompressiblePhasicitySpontaneousPropertiesSummary +----------+------------+---------+-----------+----------+-------+ IJV           Full       Yes       Yes                      +----------+------------+---------+-----------+----------+-------+ Subclavian               Yes       Yes                      +----------+------------+---------+-----------+----------+-------+ Axillary      Full                                          +----------+------------+---------+-----------+----------+-------+ Brachial      None       No        No                Acute  +----------+------------+---------+-----------+----------+-------+ Radial        Full                                          +----------+------------+---------+-----------+----------+-------+ Ulnar         Full                                          +----------+------------+---------+-----------+----------+-------+ Cephalic      Full                                          +----------+------------+---------+-----------+----------+-------+ Basilic       Full                                          +----------+------------+---------+-----------+----------+-------+  Left Findings: +----------+------------+---------+-----------+----------+-------+ LEFT      CompressiblePhasicitySpontaneousPropertiesSummary +----------+------------+---------+-----------+----------+-------+ Subclavian               Yes       Yes                      +----------+------------+---------+-----------+----------+-------+  Summary:  Right: Findings consistent with acute deep vein thrombosis involving the right brachial veins.  Left: No evidence of thrombosis in the subclavian.  *See table(s) above for measurements and observations.  Diagnosing physician: Waverly Ferrari MD Electronically signed by Waverly Ferrari MD on 08/31/2022 at 6:00:07 PM.    Final    DG CHEST PORT 1 VIEW  Result Date:  08/31/2022 CLINICAL DATA:  Mucous plugging of bronchi. EXAM: PORTABLE CHEST 1 VIEW COMPARISON:  AP chest 08/30/2022 and 08/29/2022 FINDINGS: Endotracheal tube tip terminates approximately 6.5 cm above the carina, at the level of the inferior aspect of the clavicular heads. This appears to be mildly retracted from prior. Left upper extremity PICC tip terminates overlying the superior vena cava/right atrial junction, new from prior. Enteric tube descends below the diaphragm with the tip excluded by collimation, similar to prior. Cardiac silhouette and mediastinal contours are unchanged and within normal limits. Mildly to mildly decreased lung volumes. Left basilar horizontal linear opacities are similar to multiple prior radiographs. Additional heterogeneous retrocardiac opacity. No pneumothorax. No acute skeletal abnormality. IMPRESSION: 1. Endotracheal tube tip terminates approximately 6.5 cm above the carina, at the level of the inferior aspect of the clavicular heads. This appears to be mildly retracted from prior. 2. Left upper extremity PICC tip terminates overlying the superior vena cava/right atrial junction, new from prior. 3. Left basilar streaky heterogeneous opacity, atelectasis versus pneumonia, similar to prior. Electronically Signed   By: Neita Garnet M.D.   On: 08/31/2022 16:44   [place] EKG SITE RITE  Result Date: 08/31/2022 If Site Rite image not attached, placement could not be confirmed due to current cardiac rhythm.  CT HEAD WO CONTRAST ( )  Result Date: 08/31/2022 CLINICAL DATA:  Hemorrhagic stroke EXAM: CT HEAD WITHOUT CONTRAST TECHNIQUE: Contiguous axial images were obtained from the base of the skull through the vertex without intravenous contrast. RADIATION DOSE REDUCTION: This exam was performed according to the departmental dose-optimization program which includes automated exposure control, adjustment of the mA and/or kV according to patient size and/or use of iterative  reconstruction technique. COMPARISON:  08/27/2022 FINDINGS: Brain: Unchanged appearance of left thalamic intraparenchymal hematoma with large degree of intraventricular extension. Unchanged rightward midline shift approximately 6 mm. Hydrocephalus has improved following EVD placement. Vascular: No hyperdense vessel or unexpected calcification. Skull: Right frontal burr hole Sinuses/Orbits: Worsened paranasal sinus opacification. Other: None IMPRESSION: 1. Unchanged appearance of left thalamic intraparenchymal hematoma with large degree of intraventricular extension. 2. Hydrocephalus has improved following EVD placement. 3. Worsened paranasal sinus opacification. Electronically Signed   By: Deatra Robinson M.D.   On: 08/31/2022 01:35   DG CHEST PORT 1 VIEW  Result Date: 08/30/2022 CLINICAL DATA:  [id number] Impaired oral gastric feeding tube, subsequent encounter [id number] EXAM: PORTABLE CHEST 1 VIEW COMPARISON:  Chest radiograph yesterday. FINDINGS: Endotracheal tube tip 4.8 cm from the carina. Enteric tube tip below the diaphragm not included in the field of view. Again seen elevation of right hemidiaphragm. Similar streaky bibasilar opacities. No large pleural effusion. No pneumothorax. Stable heart size and mediastinal contours. IMPRESSION: 1. Stable support apparatus. 2. Unchanged streaky bibasilar opacities, favor atelectasis. Electronically Signed   By: Shawna Orleans  Sanford M.D.   On: 08/30/2022 10:35   DG Chest Port 1 View  Result Date: 08/29/2022 CLINICAL DATA:  Respiratory failure. EXAM: PORTABLE CHEST 1 VIEW COMPARISON:  Chest radiograph 08/27/2022 FINDINGS: An endotracheal tube remains in place and terminates 3.5 cm above the carina. An enteric tube courses into the abdomen with tip not imaged. The cardiomediastinal silhouette is unchanged with aneurysmal dilatation of the aortic arch and descending thoracic aorta known by CT. Lung volumes are low with similar appearance of streaky left greater than right  basilar opacities. No large pleural effusion or pneumothorax is identified. No acute osseous abnormality is seen. IMPRESSION: Unchanged left greater than right basilar opacities, favor atelectasis. Electronically Signed   By: Sebastian Ache M.D.   On: 08/29/2022 11:39   DG Abd Portable 1V  Result Date: 08/28/2022 CLINICAL DATA:  [id number] Encounter for imaging study to confirm orogastric (OG) tube placement [id number] EXAM: PORTABLE ABDOMEN - 1 VIEW COMPARISON:  CT 08/09/2022. FINDINGS: Nasogastric tube tip and side port overlie the stomach. Nonobstructive bowel gas pattern. Retained contrast material in the renal collecting systems. IMPRESSION: Nasogastric tube tip and side port overlie the stomach. Nonobstructive bowel gas pattern. Electronically Signed   By: Caprice Renshaw M.D.   On: 08/28/2022 08:11   CT ANGIO HEAD NECK W WO CM  Result Date: 08/28/2022 CLINICAL DATA:  67 year old male found down with intracranial hemorrhage. Left thalamic bleed with intraventricular and subarachnoid extension of blood. EVD placed. EXAM: CT ANGIOGRAPHY HEAD AND NECK WITH AND WITHOUT CONTRAST TECHNIQUE: Multidetector CT imaging of the head and neck was performed using the standard protocol during bolus administration of intravenous contrast. Multiplanar CT image reconstructions and MIPs were obtained to evaluate the vascular anatomy. Carotid stenosis measurements (when applicable) are obtained utilizing NASCET criteria, using the distal internal carotid diameter as the denominator. RADIATION DOSE REDUCTION: This exam was performed according to the departmental dose-optimization program which includes automated exposure control, adjustment of the mA and/or kV according to patient size and/or use of iterative reconstruction technique. CONTRAST:  75mL OMNIPAQUE IOHEXOL 350 MG/ML SOLN COMPARISON:  Head CT 08/27/2022 and earlier. Chest CTA 09/01/2022. FINDINGS: CT HEAD Brain: Oval and confluent left thalamic hematoma encompasses 13 x 39 x  22 mm (AP by transverse by CC), about 1 mm smaller in each direction since presentation on 09/03/2022. Ongoing large volume of intraventricular blood. Stable EVD terminating in the right lateral ventricle near the septum pellucidum. Stable ventricle size and configuration. Stable mostly pre medullary cistern subarachnoid blood extension. Increased hypodense edema in and around the thalamus since presentation. But regional mass effect has not significantly changed. There is stable mild rightward midline shift about 2 mm. Stable gray-white differentiation elsewhere. No cortically based acute infarct identified. Basilar cisterns remain patent. Calvarium and skull base: Stable right vertex burr hole. No acute osseous abnormality identified. Paranasal sinuses: Increasing layering sphenoid sinus fluid in the setting of intubation. Left maxillary retention cyst again noted. Tympanic cavities and mastoids remain well aerated. Orbits: Largely resolved postoperative right scalp vertex soft tissue gas. No new orbit or scalp soft tissue finding. CTA NECK Skeleton: Some limited cervical spine bony detail due to extensive paravertebral venous contrast reflux. No acute osseous abnormality identified. Cervical spine degeneration. Upper chest: Endotracheal tube tip partially visible above the carina. Lung apices are clear. Enteric tube visible in the thoracic esophagus at that level. There appears to be functional stenosis of the left innominate vein in the superior mediastinum and part related to tortuous arch and  great vessels. Left upper extremity contrast injection. Other neck: Intubated with appropriate course of the visible ET and enteric tube. Pronounced venous contrast reflux in the neck, especially the left IJ and paravertebral venous system. No acute soft tissue finding identified. Aortic arch: Unchanged aneurysmal distal aortic arch/proximal descending thoracic aorta as demonstrated on chest CTA 09/03/2022 (please see  that report) 3 vessel arch configuration. Minor arch atherosclerosis. Right carotid system: Incompletely visible brachiocephalic artery today but negative recently. Right CCA origin remains normal. Capacious right carotid bifurcation. No right carotid plaque or stenosis in the neck. Mild tortuosity. Left carotid system: Similar mild tortuosity with no plaque or stenosis to the skull base. Vertebral arteries: Mildly tortuous proximal right subclavian artery. Right vertebral artery origin appears normal. Right V1 and V2 segment detail partially obscured by paravertebral venous contrast. But the right vertebral artery is patent to the skull base, grossly normal. Proximal left subclavian artery mild tortuosity. Normal left vertebral artery origin. Normal left V1 segment. Left V2 and V3 detail intermittently limited by paravertebral venous contrast but the left vertebral artery is patent to the skull base, grossly normal. CTA HEAD Posterior circulation: Codominant vertebral V4 segments and vertebrobasilar junction appear normal. Normal left PICA and dominant appearing right AICA origins. Patent basilar artery without stenosis. Patent SCA origins. Fetal type PCA origins more so the left. Unremarkable basilar tip. Bilateral PCA branches are within normal limits. No left thalamic hemorrhage CTA spot sign. No abnormal vascularity identified in association with the hematoma. Anterior circulation: Both ICA siphons are patent. Mild calcified supraclinoid plaque bilaterally. No siphon stenosis. Bilateral posterior communicating artery origins appear normal. Patent carotid termini. Dominant right and diminutive or absent left ACA A1 segments. Normal anterior communicating artery. Bilateral ACA branches are within normal limits. Left MCA M1 segment and bifurcation are patent without stenosis. Right MCA M1 segment and trifurcation are patent without stenosis. Bilateral MCA branches are within normal limits. Venous sinuses:  Intracranial venous structures appear grossly patent. The straight sinus, vein of Galen and internal cerebral veins are patent and enhancing. Anatomic variants: Dominant right and diminutive or absent left ACA A1 segments. Fetal type PCA origins. Review of the MIP images confirms the above findings IMPRESSION: 1. Left thalamic hematoma is stable to slightly smaller since presentation. Surrounding edema with mild regional mass effect. 2. Stable EVD. Ongoing relatively large volume of intraventricular hemorrhage with stable ventricle size and configuration. Small volume mostly pre medullary cistern SAH is stable. 3. Stable intracranial mass effect with mild rightward midline shift (2 mm). No new intracranial abnormality. 4. CTA is negative for aneurysm, CTA spot sign, or vascular malformation in association with the left thalamic hematoma. And no significant atherosclerosis or arterial stenosis in the head or neck. 5. 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 See Chest CTA 08/27/2022. 6. Satisfactory visible endotracheal and enteric tubes. Electronically Signed   By: Odessa Fleming M.D.   On: 08/28/2022 05:46   ECHOCARDIOGRAM COMPLETE  Result Date: 08/27/2022    ECHOCARDIOGRAM REPORT   Patient Name:   Jimmy Marshall Date of Exam: 08/27/2022 Medical Rec #:  [id number]       Height:       71.0 in Accession #:    [id number]      Weight:       195.5 lb Date of Birth:  11-Sep-1955      BSA:          2.088 m Patient Age:  66 years        BP:           122/90 mmHg Patient Gender: M               HR:           73 bpm. Exam Location:  Inpatient Procedure: 2D Echo, Cardiac Doppler and Color Doppler Indications:    Stroke  History:        Patient has no prior history of Echocardiogram examinations.                 Risk Factors:Hypertension.  Sonographer:    Darlys Gales Referring Phys: [id number] DEVON SHAFER IMPRESSIONS  1. Left ventricular ejection  fraction, by estimation, is 50 to 55%. The left ventricle has low normal function. The left ventricle has no regional wall motion abnormalities. Left ventricular diastolic parameters are consistent with Grade I diastolic dysfunction (impaired relaxation).  2. Right ventricular systolic function is normal. The right ventricular size is normal.  3. The mitral valve is normal in structure. No evidence of mitral valve regurgitation. No evidence of mitral stenosis.  4. The aortic valve is normal in structure. Aortic valve regurgitation is not visualized. No aortic stenosis is present.  5. The inferior vena cava is dilated in size with <50% respiratory variability, suggesting right atrial pressure of 15 mmHg. FINDINGS  Left Ventricle: Left ventricular ejection fraction, by estimation, is 50 to 55%. The left ventricle has low normal function. The left ventricle has no regional wall motion abnormalities. The left ventricular internal cavity size was normal in size. There is no left ventricular hypertrophy. Left ventricular diastolic parameters are consistent with Grade I diastolic dysfunction (impaired relaxation). Right Ventricle: The right ventricular size is normal. No increase in right ventricular wall thickness. Right ventricular systolic function is normal. Left Atrium: Left atrial size was normal in size. Right Atrium: Right atrial size was normal in size. Pericardium: There is no evidence of pericardial effusion. Mitral Valve: The mitral valve is normal in structure. No evidence of mitral valve regurgitation. No evidence of mitral valve stenosis. Tricuspid Valve: The tricuspid valve is normal in structure. Tricuspid valve regurgitation is not demonstrated. No evidence of tricuspid stenosis. Aortic Valve: The aortic valve is normal in structure. Aortic valve regurgitation is not visualized. No aortic stenosis is present. Aortic valve mean gradient measures 3.0 mmHg. Aortic valve peak gradient measures 5.4 mmHg.  Aortic valve area, by VTI measures 2.63 cm. Pulmonic Valve: The pulmonic valve was normal in structure. Pulmonic valve regurgitation is not visualized. No evidence of pulmonic stenosis. Aorta: The aortic root is normal in size and structure. Venous: The inferior vena cava is dilated in size with less than 50% respiratory variability, suggesting right atrial pressure of 15 mmHg. IAS/Shunts: No atrial level shunt detected by color flow Doppler.  LEFT VENTRICLE PLAX 2D LVIDd:         5.00 cm   Diastology LVIDs:         3.40 cm   LV e' medial:    6.85 cm/s LV PW:         1.10 cm   LV E/e' medial:  7.6 LV IVS:        1.00 cm   LV e' lateral:   6.74 cm/s LVOT diam:     2.00 cm   LV E/e' lateral: 7.7 LV SV:         58 LV SV Index:   28 LVOT Area:  3.14 cm  RIGHT VENTRICLE RV S prime:     18.80 cm/s TAPSE (M-mode): 3.2 cm LEFT ATRIUM             Index        RIGHT ATRIUM           Index LA Vol (A2C):   35.9 ml 17.19 ml/m  RA Area:     14.30 cm LA Vol (A4C):   26.4 ml 12.64 ml/m  RA Volume:   38.30 ml  18.34 ml/m LA Biplane Vol: 31.1 ml 14.89 ml/m  AORTIC VALVE AV Area (Vmax):    2.71 cm AV Area (Vmean):   2.53 cm AV Area (VTI):     2.63 cm AV Vmax:           116.00 cm/s AV Vmean:          85.900 cm/s AV VTI:            0.220 m AV Peak Grad:      5.4 mmHg AV Mean Grad:      3.0 mmHg LVOT Vmax:         99.90 cm/s LVOT Vmean:        69.100 cm/s LVOT VTI:          0.184 m LVOT/AV VTI ratio: 0.84  AORTA Ao Root diam: 3.40 cm MITRAL VALVE MV Area (PHT): 3.40 cm    SHUNTS MV Decel Time: 223 msec    Systemic VTI:  0.18 m MV E velocity: 51.80 cm/s  Systemic Diam: 2.00 cm MV A velocity: 58.30 cm/s MV E/A ratio:  0.89 Aditya Sabharwal Electronically signed by Dorthula Nettles Signature Date/Time: 08/27/2022/11:51:37 AM    Final    Overnight EEG with video  Result Date: 08/27/2022 Charlsie Quest, MD     08/27/2022  3:24 PM Patient Name: Jimmy Marshall MRN: [id number] Epilepsy Attending: Charlsie Quest Referring  Physician/Provider: Jefferson Fuel, MD Duration: 08/27/2022 1535 to 08/28/2022 1522 Patient history: 67 y.o. male presenting with unresponsiveness.  CT Head done and shows large left thalamic IPH with IVH and hydrocephalus. Intermittent nonrhythmic shaking spells involving LUE. EEG to evaluate for seizure.  Level of alertness: comatose AEDs during EEG study: LEV, propofol Technical aspects: This EEG study was done with scalp electrodes positioned according to the 10-20 International system of electrode placement. Electrical activity was reviewed with band pass filter of 1-70Hz , sensitivity of 7 uV/mm, display speed of 85mm/sec with a 60Hz  notched filter applied as appropriate. EEG data were recorded continuously and digitally stored.  Video monitoring was available and reviewed as appropriate. Description: EEG showed continuous generalized 3 to 6 Hz theta-delta slowing with overriding 13-15hz  Hz beta activity distributed symmetrically and diffusely. Hyperventilation and photic stimulation were not performed.   ABNORMALITY - Continuous slow, generalized IMPRESSION: This study is suggestive of severe diffuse encephalopathy, nonspecific etiology but likely related to sedation. No seizures or epileptiform discharges were seen throughout the recording. Charlsie Quest   DG Chest Port 1 View  Result Date: 08/27/2022 CLINICAL DATA:  67 year old male status post acute intracranial hemorrhage. Intubated. EXAM: PORTABLE CHEST 1 VIEW COMPARISON:  Portable chest 09/02/2022 and earlier. FINDINGS: Portable AP semi upright view at 0524 hours. Endotracheal tube tip in satisfactory position just below the clavicles. Enteric tube courses into the left abdomen, tip not included. Tortuous thoracic aorta. Stable cardiac size and mediastinal contours. No pneumothorax or pulmonary edema. Streaky lung base opacity greater on the left has not significantly changed. No  obvious pleural effusion. Paucity of bowel gas.  Stable visualized  osseous structures. IMPRESSION: 1.  Stable lines and tubes. 2. Stable streaky lung base opacity greater on the left. Favor atelectasis. 3. No new cardiopulmonary abnormality. Electronically Signed   By: Odessa Fleming M.D.   On: 08/27/2022 06:11   CT HEAD WO CONTRAST ( )  Result Date: 08/27/2022 CLINICAL DATA:  67 year old male with intracranial hemorrhage, found down. EXAM: CT HEAD WITHOUT CONTRAST TECHNIQUE: Contiguous axial images were obtained from the base of the skull through the vertex without intravenous contrast. RADIATION DOSE REDUCTION: This exam was performed according to the departmental dose-optimization program which includes automated exposure control, adjustment of the mA and/or kV according to patient size and/or use of iterative reconstruction technique. COMPARISON:  Head CT 08/27/2022. FINDINGS: Brain: New external ventricular drain placed from a superior right frontal approach and terminates in the right lateral ventricle near the septum pellucidum on coronal image 31. But moderate to large volume of intraventricular hemorrhage remains. Ventricle size does appear slightly decreased from yesterday afternoon. Intra-axial hemorrhage in the left thalamus size and configuration stable. Surrounding hypodense edema. Small volume of subarachnoid hemorrhage in the posterior fossa also, favor subarachnoid extension from the 4th ventricle. No definite new areas of hemorrhage. Stable gray-white matter differentiation throughout the brain. Stable basilar cistern patency. There is trace rightward midline shift now, 2-3 mm. Vascular: Calcified atherosclerosis at the skull base. Skull: Small new superior right frontal bone burr hole. Elsewhere the skull appears stable and intact. Sinuses/Orbits: Trace new paranasal sinus fluid in the setting of intubation. Left maxillary retention cysts. Tympanic cavities and mastoids remain aerated. Other: Scalp EEG leads now. And new postoperative changes to the right scalp  vertex related to EVD. Visualized orbit soft tissues are within normal limits. Partially visible endotracheal tube. IMPRESSION: 1. Intra-axial component of left thalamic hemorrhage appears stable in size and configuration. 2. New right superior frontal approach external ventricular drain placed, terminates in the right lateral ventricle. 3. Ventricular system may be slightly smaller but otherwise no significant improvement moderate to large intraventricular volume of blood, and small volume of subarachnoid blood in the posterior fossa which probably extended from the 4th ventricle. 4. Mild new rightward midline shift, 2-3 mm. 5. No other new intracranial abnormality. Electronically Signed   By: Odessa Fleming M.D.   On: 08/27/2022 06:10   DG Chest Port 1 View  Result Date: 08/22/2022 CLINICAL DATA:  History of ETT EXAM: PORTABLE CHEST 1 VIEW COMPARISON:  Radiograph 08/17/2022 FINDINGS: Endotracheal tube overlies the midthoracic trachea approximately 4.3 cm above the carina. Unchanged cardiomediastinal silhouette with tortuous aorta. Nasogastric tube passes below the diaphragm, tip excluded by collimation. Bibasilar atelectasis, left greater than right. No new airspace disease. No pleural effusion or evidence of pneumothorax. Bones are unchanged. IMPRESSION: Endotracheal tube tip overlies the midthoracic trachea approximately 4.3 cm above the carina. Unchanged cardiomediastinal silhouette. Bibasilar atelectasis.  No new airspace disease. Electronically Signed   By: Caprice Renshaw M.D.   On: 08/23/2022 15:50   CT Angio Chest/Abd/Pel for Dissection W and/or Wo Contrast  Result Date: 08/09/2022 CLINICAL DATA:  Acute aortic syndrome, arrest, found down EXAM: CT ANGIOGRAPHY CHEST, ABDOMEN AND PELVIS TECHNIQUE: Non-contrast CT of the chest was initially obtained. Multidetector CT imaging through the chest, abdomen and pelvis was performed using the standard protocol during bolus administration of intravenous contrast.  Multiplanar reconstructed images and MIPs were obtained and reviewed to evaluate the vascular anatomy. RADIATION DOSE REDUCTION: This exam was performed  according to the departmental dose-optimization program which includes automated exposure control, adjustment of the mA and/or kV according to patient size and/or use of iterative reconstruction technique. CONTRAST:  OMNIPAQUE IOHEXOL 350 MG/ML SOLN COMPARISON:  CT chest angiogram, 07/15/2021 FINDINGS: CTA CHEST FINDINGS VASCULAR Aorta: Satisfactory opacification of the aorta. Enlargement of the distal aortic arch and proximal ascending thoracic aorta, measuring up to 4.3 x 4.3 cm (series 10, image 114, series 6, image 36). The tubular ascending thoracic aorta is normal in caliber. Minimal enlargement of the remaining descending thoracic aorta, measuring up to 3.2 x 3.2 cm (series 6, image 87). No evidence of dissection or other acute aortic pathology. No significant atherosclerosis Cardiovascular: No evidence of pulmonary embolism on limited non-tailored examination. Normal heart size. No pericardial effusion. Review of the MIP images confirms the above findings. NON VASCULAR Mediastinum/Nodes: No enlarged mediastinal, hilar, or axillary lymph nodes. Thyroid gland, trachea, and esophagus demonstrate no significant findings. Lungs/Pleura: Endotracheal intubation, endotracheal tube tip at the ostium of the right mainstem bronchus (series 6, image 45, series 9, image 85). Dependent bibasilar atelectasis or consolidation, greater on the left. Musculoskeletal: No chest wall abnormality. No acute osseous findings. Review of the MIP images confirms the above findings. CTA ABDOMEN AND PELVIS FINDINGS VASCULAR Normal contour and caliber of the abdominal aorta. No evidence of aneurysm, dissection, or other acute aortic pathology. Mild mixed calcific atherosclerosis. 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 Review of the MIP images confirms the above findings. NON-VASCULAR Hepatobiliary: No solid liver abnormality is seen. Multiple simple liver cysts and/or hemangiomata, for which no further follow-up or characterization is required. No gallstones, gallbladder wall thickening, or biliary dilatation. Pancreas: Unremarkable. No pancreatic ductal dilatation or surrounding inflammatory changes. Spleen: Normal in size without significant abnormality. Adrenals/Urinary Tract: Benign, nodular adenomatous thickening of the adrenal glands, for which no specific further follow-up or characterization is required kidneys are normal, without renal calculi, solid lesion, or hydronephrosis. Bladder is unremarkable. Stomach/Bowel: Stomach is within normal limits. Appendix appears normal. No evidence of bowel wall thickening, distention, or inflammatory changes. Lymphatic: No enlarged abdominal or pelvic lymph nodes. Reproductive: No mass or other significant abnormality. Other: Small, fat containing bilateral inguinal hernias. No ascites. Musculoskeletal: No acute osseous findings. IMPRESSION: 1. No evidence of aortic dissection or other acute aortic pathology. Mild mixed calcific atherosclerosis of the abdominal aorta. 2. Unchanged enlargement of the distal aortic arch and proximal ascending thoracic aorta, measuring up to 4.3 x 4.3 cm. The tubular ascending thoracic aorta is normal in caliber. 3. Endotracheal intubation, endotracheal tube tip at the ostium of the right mainstem bronchus. Recommend retraction. 4. Dependent bibasilar atelectasis or consolidation, greater on the left. These results will be called to the ordering clinician or representative by the Radiologist Assistant, and communication documented in the PACS or [employer]. Electronically Signed   By: Jearld Lesch M.D.   On: 08/09/2022 14:15   CT Head Wo Contrast  Result Date: 09/03/2022 CLINICAL DATA:  Delirium unresponsive, found down,  concern for intracranial bleed EXAM: CT HEAD WITHOUT CONTRAST TECHNIQUE: Contiguous axial images were obtained from the base of the skull through the vertex without intravenous contrast. RADIATION DOSE REDUCTION: This exam was performed according to the departmental dose-optimization program which includes automated exposure control, adjustment of the mA and/or kV according to patient size and/or use of iterative reconstruction technique. COMPARISON:  None Available. FINDINGS: Brain: There is a 3.9 x 2.3 x  1.5 cm parenchymal hematoma centered in the left thalamus. There is intraventricular extension with associated communicating hydrocephalus. There is no CT evidence of an acute cortical infarct. No significant midline shift. Vascular: No hyperdense vessel or unexpected calcification. Skull: Normal. Negative for fracture or focal lesion. Sinuses/Orbits: No middle ear or mastoid effusion. Polypoid mucosal thickening bilateral maxillary sinuses. Orbits are unremarkable. Other: None. IMPRESSION: Acute parenchymal hematoma centered in the left thalamus (approximately 7 cc) with intraventricular extension and associated communicating hydrocephalus. Findings were discussed with Dr. Posey Rea on 08/05/2022 at 2:02 PM. Electronically Signed   By: Lorenza Cambridge M.D.   On: 08/08/2022 14:03   DG Abdomen 1 View  Result Date: 08/24/2022 CLINICAL DATA:  OG tube placement. EXAM: ABDOMEN - 1 VIEW COMPARISON:  None Available. FINDINGS: An enteric tube terminates over the left upper quadrant in the expected region of the gastric body with the side port also well below the diaphragm. No dilated loops of bowel are seen in the included portion of the abdomen. No acute osseous abnormality is identified. IMPRESSION: Enteric tube in the stomach. Electronically Signed   By: Sebastian Ache M.D.   On: 09/01/2022 13:53   DG Chest Portable 1 View  Result Date: 08/18/2022 CLINICAL DATA:  Intubation. EXAM: PORTABLE CHEST 1 VIEW COMPARISON:  Chest  radiographs 05/27/2021 FINDINGS: An endotracheal tube has been placed and terminates 3.5 cm above the carina. An enteric tube has been placed and courses into the abdomen with tip not included on this study. The cardiomediastinal silhouette is unchanged with normal heart size and chronic prominence of the aortic arch and descending aorta. Streaky perihilar and infrahilar opacities are present in the left greater than right lungs. No sizable pleural effusion or pneumothorax is identified. No acute osseous abnormality is seen. IMPRESSION: 1. Endotracheal tube in satisfactory position. 2. Streaky left greater than right lung opacities favored to reflect atelectasis. Correlate with pending CT. Electronically Signed   By: Sebastian Ache M.D.   On: 08/28/2022 13:52    Microbiology Recent Results (from the past 240 hour(s))  Culture, Respiratory w Gram Stain (tracheal aspirate)     Status: None   Collection Time: 08/29/2022  7:54 PM   Specimen: Tracheal Aspirate; Respiratory  Result Value Ref Range Status   Specimen Description TRACHEAL ASPIRATE  Final   Special Requests NONE  Final   Gram Stain   Final    ABUNDANT WBC PRESENT, PREDOMINANTLY PMN ABUNDANT GRAM POSITIVE COCCI IN PAIRS RARE GRAM NEGATIVE RODS    Culture   Final    FEW Normal respiratory flora-no Staph aureus or Pseudomonas seen Performed at [facility], [address], [place], [place] [address]    Report Status 08/29/2022 FINAL  Final  Culture, Respiratory w Gram Stain     Status: None   Collection Time: 08/30/22 11:23 AM   Specimen: Tracheal Aspirate; Respiratory  Result Value Ref Range Status   Specimen Description TRACHEAL ASPIRATE  Final   Special Requests NONE  Final   Gram Stain   Final    RARE WBC PRESENT,BOTH PMN AND MONONUCLEAR FEW GRAM POSITIVE COCCI IN CLUSTERS FEW GRAM NEGATIVE RODS    Culture   Final    FEW KLEBSIELLA PNEUMONIAE FEW ESCHERICHIA COLI ABUNDANT DIPHTHEROIDS(CORYNEBACTERIUM SPECIES) Standardized  susceptibility testing for this organism is not available. Performed at [facility], [address] [place], [place] [address]    Report Status 09/02/2022 FINAL  Final   Organism ID, Bacteria KLEBSIELLA PNEUMONIAE  Final  Organism ID, Bacteria ESCHERICHIA COLI  Final      Susceptibility   Escherichia coli - MIC*    AMPICILLIN 4 SENSITIVE Sensitive     CEFEPIME <=0.12 SENSITIVE Sensitive     CEFTAZIDIME <=1 SENSITIVE Sensitive     CEFTRIAXONE <=0.25 SENSITIVE Sensitive     CIPROFLOXACIN <=0.25 SENSITIVE Sensitive     GENTAMICIN <=1 SENSITIVE Sensitive     IMIPENEM <=0.25 SENSITIVE Sensitive     TRIMETH/SULFA <=20 SENSITIVE Sensitive     AMPICILLIN/SULBACTAM <=2 SENSITIVE Sensitive     PIP/TAZO <=4 SENSITIVE Sensitive     * FEW ESCHERICHIA COLI   Klebsiella pneumoniae - MIC*    AMPICILLIN >=32 RESISTANT Resistant     CEFEPIME <=0.12 SENSITIVE Sensitive     CEFTAZIDIME <=1 SENSITIVE Sensitive     CEFTRIAXONE <=0.25 SENSITIVE Sensitive     CIPROFLOXACIN <=0.25 SENSITIVE Sensitive     GENTAMICIN <=1 SENSITIVE Sensitive     IMIPENEM <=0.25 SENSITIVE Sensitive     TRIMETH/SULFA <=20 SENSITIVE Sensitive     AMPICILLIN/SULBACTAM 4 SENSITIVE Sensitive     PIP/TAZO <=4 SENSITIVE Sensitive     * FEW KLEBSIELLA PNEUMONIAE    Lab Basic Metabolic Panel: Recent Labs  Lab 08/30/22 0541 08/30/22 0813 09/01/22 0433 09/01/22 1017 09/02/22 0509 09/03/22 0639 09/03/22 0859 09/04/22 0542 09/15/2022 0526  NA 156*   < > 150*  --  143 146* 149* 145 146*  K 3.1*   < > 3.6  --  4.1 3.7 3.8 3.5 4.1  CL 123*   < > 119*  --  113* 115*  --  111 112*  CO2 22   < > 23  --  19* 22  --  23 25  GLUCOSE 126*   < > 177*  --  179* 243*  --  266* 201*  BUN 25*   < > 29*  --  27* 30*  --  32* 35*  CREATININE 1.11   < > 1.06  --  1.03 1.07  --  1.11 1.28*  CALCIUM 9.1   < > 8.0*  --  8.2* 8.2*  --  8.4* 8.8*  MG  --   --   --  2.7* 3.0*  --   --   --   --   PHOS 3.4  --   --   --   --   --   --    --   --    < > = values in this interval not displayed.   Liver Function Tests: No results for input(s): "AST", "ALT", "ALKPHOS", "BILITOT", "PROT", "ALBUMIN" in the last 168 hours. No results for input(s): "LIPASE", "AMYLASE" in the last 168 hours. No results for input(s): "AMMONIA" in the last 168 hours. CBC: Recent Labs  Lab 09/01/22 0433 09/02/22 0509 09/03/22 0639 09/03/22 0859 09/04/22 0542 09/07/2022 0526  WBC 8.7 7.6 7.2  --  7.5 8.2  HGB 10.6* 10.6* 9.6* 10.5* 9.9* 9.8*  HCT 33.8* 32.6* 30.9* 31.0* 31.5* 30.0*  MCV 86.9 88.6 87.0  --  86.5 87.0  PLT 178 186 168  --  205 205   Cardiac Enzymes: No results for input(s): "CKTOTAL", "CKMB", "CKMBINDEX", "TROPONINI" in the last 168 hours. Sepsis Labs: Recent Labs  Lab 09/02/22 0509 09/03/22 0639 09/04/22 0542 10/02/2022 0526  WBC 7.6 7.2 7.5 8.2    Procedures/Operations  Intubation, EVD, Central line, arterial line, mechanical ventilation   Kyrielle Urbanski 09/25/2022, 4:38 PM

## 2022-10-05 NOTE — Progress Notes (Signed)
Time of death 1310. Dr. Denese Killings notified. Patient family at bedside. No belongings in room.

## 2022-10-05 NOTE — Procedures (Signed)
Extubation Procedure Note  Patient Details:   Name: Jimmy Marshall DOB: 12-03-1955 MRN: [id number]   Airway Documentation:    Vent end date: 09/11/2022 Vent end time: 1220   Evaluation  O2 sats: currently acceptable Complications: No apparent complications Patient did tolerate procedure well. Bilateral Breath Sounds: Clear, Diminished   No  Patient extubated per MD order for withdrawal of care.  Patient tolerated well.    Elyn Peers 09/21/2022, 12:23 PM

## 2022-10-05 DEATH — deceased
